# Patient Record
Sex: Female | Born: 1950 | Race: White | Hispanic: No | Marital: Married | Smoking: Never smoker
Health system: Southern US, Community
[De-identification: ages and names within clinical notes are randomized; demographics above are authoritative.]

## PROBLEM LIST (undated history)

## (undated) DIAGNOSIS — E079 Disorder of thyroid, unspecified: Secondary | ICD-10-CM

## (undated) DIAGNOSIS — J45909 Unspecified asthma, uncomplicated: Secondary | ICD-10-CM

## (undated) DIAGNOSIS — R1310 Dysphagia, unspecified: Secondary | ICD-10-CM

## (undated) DIAGNOSIS — K449 Diaphragmatic hernia without obstruction or gangrene: Secondary | ICD-10-CM

## (undated) DIAGNOSIS — M1990 Unspecified osteoarthritis, unspecified site: Secondary | ICD-10-CM

## (undated) DIAGNOSIS — E669 Obesity, unspecified: Secondary | ICD-10-CM

## (undated) DIAGNOSIS — C801 Malignant (primary) neoplasm, unspecified: Secondary | ICD-10-CM

## (undated) DIAGNOSIS — E039 Hypothyroidism, unspecified: Secondary | ICD-10-CM

## (undated) DIAGNOSIS — R12 Heartburn: Secondary | ICD-10-CM

## (undated) DIAGNOSIS — D649 Anemia, unspecified: Secondary | ICD-10-CM

## (undated) HISTORY — DX: Heartburn: R12

## (undated) HISTORY — DX: Disorder of thyroid, unspecified: E07.9

## (undated) HISTORY — DX: Diaphragmatic hernia without obstruction or gangrene: K44.9

## (undated) HISTORY — PX: MEDIAL PARTIAL KNEE REPLACEMENT: SHX5965

## (undated) HISTORY — DX: Obesity, unspecified: E66.9

## (undated) HISTORY — DX: Dysphagia, unspecified: R13.10

## (undated) HISTORY — DX: Unspecified osteoarthritis, unspecified site: M19.90

## (undated) HISTORY — PX: ABDOMINAL HYSTERECTOMY: SHX81

## (undated) HISTORY — DX: Anemia, unspecified: D64.9

---
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
## 1999-10-30 HISTORY — PX: LUMBAR LAMINECTOMY: SHX95

## 2012-10-29 HISTORY — PX: TOTAL HIP ARTHROPLASTY: SHX124

## 2019-10-19 ENCOUNTER — Encounter (HOSPITAL_COMMUNITY): Payer: Self-pay | Admitting: Hematology

## 2019-10-19 ENCOUNTER — Other Ambulatory Visit: Payer: Self-pay

## 2019-10-19 ENCOUNTER — Inpatient Hospital Stay (HOSPITAL_COMMUNITY): Payer: Medicare Other | Attending: Hematology | Admitting: Hematology

## 2019-10-19 VITALS — BP 133/54 | HR 77 | Temp 97.7°F | Resp 18 | Ht 64.5 in | Wt 236.0 lb

## 2019-10-19 DIAGNOSIS — Z79899 Other long term (current) drug therapy: Secondary | ICD-10-CM

## 2019-10-19 DIAGNOSIS — Z8249 Family history of ischemic heart disease and other diseases of the circulatory system: Secondary | ICD-10-CM

## 2019-10-19 DIAGNOSIS — Z8049 Family history of malignant neoplasm of other genital organs: Secondary | ICD-10-CM

## 2019-10-19 DIAGNOSIS — Z90710 Acquired absence of both cervix and uterus: Secondary | ICD-10-CM

## 2019-10-19 DIAGNOSIS — E039 Hypothyroidism, unspecified: Secondary | ICD-10-CM | POA: Diagnosis not present

## 2019-10-19 DIAGNOSIS — D509 Iron deficiency anemia, unspecified: Secondary | ICD-10-CM

## 2019-10-19 NOTE — Assessment & Plan Note (Addendum)
1.  Iron deficiency anemia: -Originally noted in 2010 worked up by gastroenterology with endoscopic and colonoscopy.  Anemia thought to be secondary to duodenal ulcers and poor absorption. -Anemia continued, repeat endoscopy and colonoscopy in 2015 did not reveal any evidence of bleeding.  Anemia thought to be secondary to poor absorption. -Repeat endoscopy and colonoscopy in 2019 did not reveal any evidence of bleeding. -Bone marrow biopsy completed in September 2020 showed normal marrow.  No evidence of hematology neoplasm or morphology.  Normal cytogenetics.  No increase in blasts on flow cytometry. -Patient is unable to tolerate p.o. iron supplements due to GI upset.  However she is continuing to take iron tablets on and off.  Unable to tolerate IV Feraheme or Venofer due to flulike symptoms.  She does not respond to IV Ferrlecit. -Most recent labs on October 12, 2019 reveal hemoglobin of 7.5, MCV 84.0, iron 13, transferrin 317, saturation 3%, TIBC 444, ferritin level 7.4. -Patient has been extensively worked up for her anemia.  It appears anemia is most likely secondary to poor GI absorption.  Today have recommended to complete additional lab studies to include vitamin H68, folic acid, methylmalonic acid, copper, SPEP, LDH, and reticulocyte count. -Have also recommended patient proceed with IV Injectafer x2 doses.  We discussed the side effects of Injectafer including but not limited to severe anaphylactic reaction.  Patient is aware of the side effects and agrees to proceed. -We will have her follow-up in 6 weeks post IV Injectafer.

## 2019-10-19 NOTE — Progress Notes (Signed)
[facility] Initial Visit:  Patient Care Team: Thea Alken as PCP - General (Nurse Practitioner)  CHIEF COMPLAINTS/PURPOSE OF CONSULTATION: -Iron deficiency anemia   HISTORY OF PRESENTING ILLNESS: Annette Frank 68 y.o. female presents today for consult regarding iron deficiency anemia.  She has a past medical history significant for vitamin D deficiency, polyarthritis, hypothyroidism.  She has a longstanding history of iron deficiency anemia.  Per patient her anemia started in 2001 after her hysterectomy.  Patient was worked up for anemia back in 2010 by gastroenterology and underwent endoscopy and colonoscopy.  Her anemia was thought to be contributed to NSAID associated duodenal ulcers and secondary poor absorption.  Despite this diagnosis, her anemia continued.  She went on to have additional EGDs and colonoscopies that were negative for any active bleeding.  At that point, anemia was thought  to be secondary to poor absorption.    Recent labs from 10/12/2019 revealed a hemoglobin of 7.5, MCV 84.0, iron level of 13, ferritin level of 7.4, transferrin 317, TIBC 444, and saturation of 3%.  She has tried multiple p.o. iron supplementation in the past and has been unable to tolerate them due to GI upset.  She is also tried IV Feraheme and Venofer and experienced flulike symptoms.  She is also recently tried IV Ferrlecit which did yield any improvement in her hemoglobin.  Patient responds best to IV Injectafer.  However, at the previous office she was being followed at [facility] is not offered.  She most recently had a bone marrow biopsy back in September 2020.  Bone marrow biopsy did not reveal any hematology malignancies or morphology.  Normal cytogenetics.  No increase in blasts on flow cytometry.  Today, she reports significant fatigue.  She denies any obvious signs of bleeding.  She does report significant shortness of breath with exertion, that does resolve with rest.   She denies any chest pain or lightheadedness or dizziness.  No change in bowel habits.  Appetite is stable.  No weight loss.  Denies any fevers, chills, night sweats.  She has a family history significant for endometrial cancer in her mother and bladder cancer in her maternal aunt.  Most recent GI work-up was in October 2019 with endoscopic and colonoscopy both were negative.   Review of Systems  Constitutional: Positive for fatigue.  HENT:  Negative.   Eyes: Negative.   Respiratory: Positive for shortness of breath.   Cardiovascular: Negative.   Gastrointestinal: Negative.   Endocrine: Negative.   Genitourinary: Negative.    Musculoskeletal: Positive for arthralgias.  Skin: Negative.   Neurological: Negative.   Hematological: Negative.   Psychiatric/Behavioral: Negative.     MEDICAL HISTORY: Past Medical History:  Diagnosis Date  . Anemia   . Arthritis   . Thyroid disease     SURGICAL HISTORY: Past Surgical History:  Procedure Laterality Date  . ABDOMINAL HYSTERECTOMY    . LUMBAR LAMINECTOMY N/A 2001  . MEDIAL PARTIAL KNEE REPLACEMENT Bilateral 2009 & 2015  . TOTAL HIP ARTHROPLASTY Right 2014  . UMBILICAL HERNIA REPAIR N/A 1955    SOCIAL HISTORY: Social History   Socioeconomic History  . Marital status: Married    Spouse name: Not on file  . Number of children: 2  . Years of education: Not on file  . Highest education level: Not on file  Occupational History  . Occupation: RETIRED  Tobacco Use  . Smoking status: Never Smoker  . Smokeless tobacco: Never Used  Substance and Sexual Activity  .  Alcohol use: Not Currently  . Drug use: Not Currently  . Sexual activity: Not Currently  Other Topics Concern  . Not on file  Social History Narrative  . Not on file   Social Determinants of Health   Financial Resource Strain: Low Risk   . Difficulty of Paying Living Expenses: Not hard at all  Food Insecurity: No Food Insecurity  . Worried About Charity fundraiser  in the Last Year: Never true  . Ran Out of Food in the Last Year: Never true  Transportation Needs: No Transportation Needs  . Lack of Transportation (Medical): No  . Lack of Transportation (Non-Medical): No  Physical Activity: Inactive  . Days of Exercise per Week: 0 days  . Minutes of Exercise per Session: 0 min  Stress: No Stress Concern Present  . Feeling of Stress : Not at all  Social Connections: Slightly Isolated  . Frequency of Communication with Friends and Family: Three times a week  . Frequency of Social Gatherings with Friends and Family: Never  . Attends Religious Services: More than 4 times per year  . Active Member of Clubs or Organizations: No  . Attends Archivist Meetings: Never  . Marital Status: Married  Human resources officer Violence: Not At Risk  . Fear of Current or Ex-Partner: No  . Emotionally Abused: No  . Physically Abused: No  . Sexually Abused: No    FAMILY HISTORY Family History  Problem Relation Age of Onset  . Hypertension Mother   . Cancer Mother        Endometrial cancer  . Hypothyroidism Father   . Arthritis Father   . Hypertension Brother   . Heart attack Maternal Grandfather   . Hypertension Son   . Healthy Son     ALLERGIES:  is allergic to cefuroxime axetil; skelaxin [metaxalone]; and codeine.  MEDICATIONS:  Current Outpatient Medications  Medication Sig Dispense Refill  . atenolol (TENORMIN) 25 MG tablet Take by mouth daily.    Marland Kitchen FERROCITE 324 MG TABS tablet Take 1 tablet by mouth daily.    Marland Kitchen levothyroxine (SYNTHROID) 100 MCG tablet Take 100 mcg by mouth daily before breakfast.    . Vitamin D, Ergocalciferol, (DRISDOL) 1.25 MG (50000 UT) CAPS capsule Take 50,000 Units by mouth once a week.    Marland Kitchen azelastine (ASTELIN) 0.1 % nasal spray Place 1 spray into both nostrils 2 (two) times daily.    . cephALEXin (KEFLEX) 500 MG capsule Take 500 mg by mouth 4 (four) times daily.    . fluticasone (FLONASE) 50 MCG/ACT nasal spray Place 1  spray into both nostrils 2 (two) times daily.     No current facility-administered medications for this visit.    PHYSICAL EXAMINATION:  ECOG PERFORMANCE STATUS: 1 - Symptomatic but completely ambulatory   Vitals:   10/19/19 1419  BP: (!) 133/54  Pulse: 77  Resp: 18  Temp: 97.7 F (36.5 C)  SpO2: 98%    Filed Weights   10/19/19 1419  Weight: 236 lb (107 kg)     Physical Exam Constitutional:      Appearance: Normal appearance. She is obese.  HENT:     Head: Normocephalic.     Right Ear: External ear normal.     Left Ear: External ear normal.     Nose: Nose normal.     Mouth/Throat:     Pharynx: Oropharynx is clear.  Eyes:     Conjunctiva/sclera: Conjunctivae normal.  Cardiovascular:     Rate  and Rhythm: Normal rate and regular rhythm.     Pulses: Normal pulses.     Heart sounds: Normal heart sounds.  Pulmonary:     Effort: Pulmonary effort is normal.     Breath sounds: Normal breath sounds.  Abdominal:     General: Bowel sounds are normal.  Musculoskeletal:     Cervical back: Normal range of motion.     Comments: Decreased range of motion  Skin:    General: Skin is warm.  Neurological:     General: No focal deficit present.     Mental Status: She is alert and oriented to person, place, and time.  Psychiatric:        Mood and Affect: Mood normal.        Behavior: Behavior normal.       ASSESSMENT/PLAN   Iron deficiency anemia 1.  Iron deficiency anemia: -Originally noted in 2010 worked up by gastroenterology with endoscopic and colonoscopy.  Anemia thought to be secondary to duodenal ulcers and poor absorption. -Anemia continued, repeat endoscopy and colonoscopy in 2015 did not reveal any evidence of bleeding.  Anemia thought to be secondary to poor absorption. -Repeat endoscopy and colonoscopy in 2019 did not reveal any evidence of bleeding. -Bone marrow biopsy completed in September 2020 showed normal marrow.  No evidence of hematology neoplasm  or morphology.  Normal cytogenetics.  No increase in blasts on flow cytometry. -Patient is unable to tolerate p.o. iron supplements due to GI upset.  However she is continuing to take iron tablets on and off.  Unable to tolerate IV Feraheme or Venofer due to flulike symptoms.  She does not respond to IV Ferrlecit. -Most recent labs on October 12, 2019 reveal hemoglobin of 7.5, MCV 84.0, iron 13, transferrin 317, saturation 3%, TIBC 444, ferritin level 7.4. -Patient has been extensively worked up for her anemia.  It appears anemia is most likely secondary to poor GI absorption.  Today have recommended to complete additional lab studies to include vitamin D17, folic acid, methylmalonic acid, copper, SPEP, LDH, and reticulocyte count. -Have also recommended patient proceed with IV Injectafer x2 doses.  We discussed the side effects of Injectafer including but not limited to severe anaphylactic reaction.  Patient is aware of the side effects and agrees to proceed. -We will have her follow-up in 6 weeks post IV Injectafer.   Orders Placed This Encounter  Procedures  . CBC with Differential    Standing Status:   Future    Standing Expiration Date:   10/18/2020  . Comprehensive metabolic panel    Standing Status:   Future    Standing Expiration Date:   10/18/2020  . Vitamin B12    Standing Status:   Future    Standing Expiration Date:   10/18/2020  . Folate    Standing Status:   Future    Standing Expiration Date:   10/18/2020  . Methylmalonic acid, serum    Standing Status:   Future    Standing Expiration Date:   10/18/2020  . Reticulocytes    Standing Status:   Future    Standing Expiration Date:   10/18/2020  . Lactate dehydrogenase    Standing Status:   Future    Standing Expiration Date:   10/18/2020  . Multiple Myeloma Panel (SPEP&IFE w/QIG)    Standing Status:   Future    Standing Expiration Date:   10/18/2020   Addendum: -I have independently examined and elicited history from  this patient.  I  agree with the HPI and assessment and plan written by my nurse practitioner Carver Fila, FNP.  She has been taking iron tablets intermittently for more than 8 years and has constipation and stomach upset from it.  She had received Injectafer x2 in May 2020 and tolerated very well.  However because of insurance requirements, she was switched to Venofer weekly, finished 6 infusions in August and September 2020.  Subsequently she was treated with Ferrlecit 6 weekly infusions which she finished on 08/31/2019.  She never had a history of transfusions.  She has some ice pica.  Also reports pins-and-needles in the feet while standing.  As she had tolerated Injectafer very well, we will proceed with Injectafer weekly x2.  We will also check for coexisting nutritional deficiencies as her MCV is 85 for the degree of anemia and degree of iron deficiency.  All questions were answered. The patient knows to call the clinic with any problems, questions or concerns.  This note was electronically signed.    Derek Jack, MD  10/19/2019 5:35 PM

## 2019-10-19 NOTE — Patient Instructions (Signed)
[place] at [facility] Discharge Instructions  You were seen today by Dr. Delton Coombes. He went over your history, family history and how you've been feeling lately. He will have blood work drawn today. He will see you back in for labs and follow up.   Thank you for choosing [place] at [facility] to provide your oncology and hematology care.  To afford each patient quality time with our provider, please arrive at least 15 minutes before your scheduled appointment time.   If you have a lab appointment with the [facility] please come in thru the  Main Entrance and check in at the main information desk  You need to re-schedule your appointment should you arrive 10 or more minutes late.  We strive to give you quality time with our providers, and arriving late affects you and other patients whose appointments are after yours.  Also, if you no show three or more times for appointments you may be dismissed from the clinic at the providers discretion.     Again, thank you for choosing [facility].  Our hope is that these requests will decrease the amount of time that you wait before being seen by our physicians.       _____________________________________________________________  Should you have questions after your visit to [facility], please contact our office at [phone] between the hours of 8:00 a.m. and 4:30 p.m.  Voicemails left after 4:00 p.m. will not be returned until the following business day.  For prescription refill requests, have your pharmacy contact our office and allow 72 hours.    Cancer Center Support Programs:   > Cancer Support Group  2nd Tuesday of the month 1pm-2pm, Journey Room

## 2019-10-21 ENCOUNTER — Inpatient Hospital Stay (HOSPITAL_COMMUNITY): Payer: Medicare Other

## 2019-10-21 ENCOUNTER — Other Ambulatory Visit: Payer: Self-pay

## 2019-10-21 VITALS — BP 126/50 | HR 65 | Temp 96.7°F | Resp 18

## 2019-10-21 DIAGNOSIS — D509 Iron deficiency anemia, unspecified: Secondary | ICD-10-CM

## 2019-10-21 MED ORDER — SODIUM CHLORIDE 0.9 % IV SOLN: Freq: Once | INTRAVENOUS | Status: AC

## 2019-10-21 MED ORDER — SODIUM CHLORIDE 0.9 % IV SOLN
750.0000 mg | Freq: Once | INTRAVENOUS | Status: AC
  Administered 2019-10-21: 750 mg via INTRAVENOUS
  Filled 2019-10-21: qty 15

## 2019-10-21 MED ORDER — FAMOTIDINE IN NACL 20-0.9 MG/50ML-% IV SOLN
20.0000 mg | Freq: Once | INTRAVENOUS | Status: AC
  Administered 2019-10-21: 13:00:00 20 mg via INTRAVENOUS
  Filled 2019-10-21: qty 50

## 2019-10-21 MED ORDER — ACETAMINOPHEN 325 MG PO TABS
650.0000 mg | ORAL_TABLET | Freq: Once | ORAL | Status: AC
  Administered 2019-10-21: 13:00:00 650 mg via ORAL
  Filled 2019-10-21: qty 2

## 2019-10-21 MED ORDER — DIPHENHYDRAMINE HCL 25 MG PO CAPS
50.0000 mg | ORAL_CAPSULE | Freq: Once | ORAL | Status: AC
  Administered 2019-10-21: 50 mg via ORAL
  Filled 2019-10-21: qty 2

## 2019-10-21 MED ORDER — METHYLPREDNISOLONE SODIUM SUCC 125 MG IJ SOLR
125.0000 mg | Freq: Once | INTRAMUSCULAR | Status: AC
  Administered 2019-10-21: 125 mg via INTRAVENOUS
  Filled 2019-10-21: qty 2

## 2019-10-21 NOTE — Progress Notes (Signed)
Pt presents today for IV Injectafer infusion. Per Harriet Pho, NP, since patient has a history of previous reactions to iron infusions, premedicate patient with PO Benadryl, Tylenol, IV Solumedrol, and IV Pepcid. Orders already entered into treatment plan. Hold Claritin since we are giving Benadryl. I confirmed with patient that she has someone that can drive her home and she states that her husband came with her and he would be able to drive her home.   Mitchel Honour tolerated IV Injectafer without incident or complaint. VSS. Discharged in satisfactory condition with follow up instructions.

## 2019-10-21 NOTE — Patient Instructions (Signed)
[place] at [facility]  Discharge Instructions:  IV Injectafer received today. _______________________________________________________________  Thank you for choosing [place] at [facility] to provide your oncology and hematology care.  To afford each patient quality time with our providers, please arrive at least 15 minutes before your scheduled appointment.  You need to re-schedule your appointment if you arrive 10 or more minutes late.  We strive to give you quality time with our providers, and arriving late affects you and other patients whose appointments are after yours.  Also, if you no show three or more times for appointments you may be dismissed from the clinic.  Again, thank you for choosing [place] at [place] hope is that these requests will allow you access to exceptional care and in a timely manner. _______________________________________________________________  If you have questions after your visit, please contact our office at [phone] between the hours of 8:30 a.m. and 5:00 p.m. Voicemails left after 4:30 p.m. will not be returned until the following business day. _______________________________________________________________  For prescription refill requests, have your pharmacy contact our office. _______________________________________________________________  Recommendations made by the consultant and any test results will be sent to your referring physician. _______________________________________________________________

## 2019-10-28 ENCOUNTER — Other Ambulatory Visit: Payer: Self-pay

## 2019-10-28 ENCOUNTER — Inpatient Hospital Stay (HOSPITAL_COMMUNITY): Payer: Medicare Other

## 2019-10-28 ENCOUNTER — Ambulatory Visit (HOSPITAL_COMMUNITY): Payer: Medicare Other

## 2019-10-28 ENCOUNTER — Encounter (HOSPITAL_COMMUNITY): Payer: Self-pay

## 2019-10-28 VITALS — BP 118/56 | HR 65 | Temp 96.8°F | Resp 18

## 2019-10-28 DIAGNOSIS — D509 Iron deficiency anemia, unspecified: Secondary | ICD-10-CM | POA: Diagnosis not present

## 2019-10-28 MED ORDER — SODIUM CHLORIDE 0.9 % IV SOLN: Freq: Once | INTRAVENOUS | Status: AC

## 2019-10-28 MED ORDER — ACETAMINOPHEN 325 MG PO TABS
ORAL_TABLET | ORAL | Status: AC
  Filled 2019-10-28: qty 1

## 2019-10-28 MED ORDER — DIPHENHYDRAMINE HCL 25 MG PO CAPS
50.0000 mg | ORAL_CAPSULE | Freq: Once | ORAL | Status: AC
  Administered 2019-10-28: 50 mg via ORAL
  Filled 2019-10-28: qty 2

## 2019-10-28 MED ORDER — FAMOTIDINE IN NACL 20-0.9 MG/50ML-% IV SOLN
20.0000 mg | Freq: Once | INTRAVENOUS | Status: AC
  Administered 2019-10-28: 09:00:00 20 mg via INTRAVENOUS
  Filled 2019-10-28: qty 50

## 2019-10-28 MED ORDER — METHYLPREDNISOLONE SODIUM SUCC 125 MG IJ SOLR
125.0000 mg | Freq: Once | INTRAMUSCULAR | Status: AC
  Administered 2019-10-28: 09:00:00 125 mg via INTRAVENOUS
  Filled 2019-10-28: qty 2

## 2019-10-28 MED ORDER — ACETAMINOPHEN 325 MG PO TABS
650.0000 mg | ORAL_TABLET | Freq: Once | ORAL | Status: AC
  Administered 2019-10-28: 650 mg via ORAL
  Filled 2019-10-28: qty 2

## 2019-10-28 MED ORDER — SODIUM CHLORIDE 0.9 % IV SOLN
750.0000 mg | Freq: Once | INTRAVENOUS | Status: AC
  Administered 2019-10-28: 750 mg via INTRAVENOUS
  Filled 2019-10-28: qty 750

## 2019-10-28 NOTE — Progress Notes (Signed)
Patient presents today for Injectafer infusion. Patient has no complaints of any changes since her last visit. Vital signs within parameters.   Injectafer given today per MD orders. Tolerated infusion without adverse affects. Vital signs stable. No complaints at this time. Discharged from clinic ambulatory. F/U with [facility] as scheduled.

## 2019-10-28 NOTE — Patient Instructions (Signed)
[facility] at [facility]  Discharge Instructions:   _______________________________________________________________  Thank you for choosing [facility] at [facility] to provide your oncology and hematology care.  To afford each patient quality time with our providers, please arrive at least 15 minutes before your scheduled appointment.  You need to re-schedule your appointment if you arrive 10 or more minutes late.  We strive to give you quality time with our providers, and arriving late affects you and other patients whose appointments are after yours.  Also, if you no show three or more times for appointments you may be dismissed from the clinic.  Again, thank you for choosing [facility] at [facility]. Our hope is that these requests will allow you access to exceptional care and in a timely manner. _______________________________________________________________  If you have questions after your visit, please contact our office at [phone] between the hours of 8:30 a.m. and 5:00 p.m. Voicemails left after 4:30 p.m. will not be returned until the following business day. _______________________________________________________________  For prescription refill requests, have your pharmacy contact our office. _______________________________________________________________  Recommendations made by the consultant and any test results will be sent to your referring physician. _______________________________________________________________ 

## 2019-12-02 ENCOUNTER — Other Ambulatory Visit (HOSPITAL_COMMUNITY): Payer: Self-pay

## 2019-12-02 DIAGNOSIS — D509 Iron deficiency anemia, unspecified: Secondary | ICD-10-CM

## 2019-12-03 ENCOUNTER — Other Ambulatory Visit: Payer: Self-pay

## 2019-12-03 ENCOUNTER — Inpatient Hospital Stay (HOSPITAL_COMMUNITY): Payer: Medicare Other | Attending: Hematology

## 2019-12-03 DIAGNOSIS — M1990 Unspecified osteoarthritis, unspecified site: Secondary | ICD-10-CM | POA: Diagnosis not present

## 2019-12-03 DIAGNOSIS — Z8249 Family history of ischemic heart disease and other diseases of the circulatory system: Secondary | ICD-10-CM | POA: Diagnosis not present

## 2019-12-03 DIAGNOSIS — E079 Disorder of thyroid, unspecified: Secondary | ICD-10-CM | POA: Insufficient documentation

## 2019-12-03 DIAGNOSIS — D509 Iron deficiency anemia, unspecified: Secondary | ICD-10-CM

## 2019-12-03 DIAGNOSIS — Z79899 Other long term (current) drug therapy: Secondary | ICD-10-CM | POA: Diagnosis not present

## 2019-12-03 DIAGNOSIS — Z90710 Acquired absence of both cervix and uterus: Secondary | ICD-10-CM | POA: Diagnosis not present

## 2019-12-03 DIAGNOSIS — Z8049 Family history of malignant neoplasm of other genital organs: Secondary | ICD-10-CM | POA: Diagnosis not present

## 2019-12-03 LAB — CBC WITH DIFFERENTIAL/PLATELET
Abs Immature Granulocytes: 0.03 10*3/uL (ref 0.00–0.07)
Basophils Absolute: 0 10*3/uL (ref 0.0–0.1)
Basophils Relative: 1 %
Eosinophils Absolute: 0.2 10*3/uL (ref 0.0–0.5)
Eosinophils Relative: 3 %
HCT: 33.7 % — ABNORMAL LOW (ref 36.0–46.0)
Hemoglobin: 10.1 g/dL — ABNORMAL LOW (ref 12.0–15.0)
Immature Granulocytes: 1 %
Lymphocytes Relative: 16 %
Lymphs Abs: 1.1 10*3/uL (ref 0.7–4.0)
MCH: 27.4 pg (ref 26.0–34.0)
MCHC: 30 g/dL (ref 30.0–36.0)
MCV: 91.6 fL (ref 80.0–100.0)
Monocytes Absolute: 0.6 10*3/uL (ref 0.1–1.0)
Monocytes Relative: 10 %
Neutro Abs: 4.6 10*3/uL (ref 1.7–7.7)
Neutrophils Relative %: 69 %
Platelets: 254 10*3/uL (ref 150–400)
RBC: 3.68 MIL/uL — ABNORMAL LOW (ref 3.87–5.11)
RDW: 18.4 % — ABNORMAL HIGH (ref 11.5–15.5)
WBC: 6.5 10*3/uL (ref 4.0–10.5)
nRBC: 0 % (ref 0.0–0.2)

## 2019-12-03 LAB — RETICULOCYTES
Immature Retic Fract: 22.4 % — ABNORMAL HIGH (ref 2.3–15.9)
RBC.: 3.7 MIL/uL — ABNORMAL LOW (ref 3.87–5.11)
Retic Count, Absolute: 104.7 10*3/uL (ref 19.0–186.0)
Retic Ct Pct: 2.8 % (ref 0.4–3.1)

## 2019-12-03 LAB — LACTATE DEHYDROGENASE: LDH: 127 U/L (ref 98–192)

## 2019-12-03 LAB — COMPREHENSIVE METABOLIC PANEL
ALT: 19 U/L (ref 0–44)
AST: 16 U/L (ref 15–41)
Albumin: 4 g/dL (ref 3.5–5.0)
Alkaline Phosphatase: 100 U/L (ref 38–126)
Anion gap: 8 (ref 5–15)
BUN: 15 mg/dL (ref 8–23)
CO2: 24 mmol/L (ref 22–32)
Calcium: 9.3 mg/dL (ref 8.9–10.3)
Chloride: 108 mmol/L (ref 98–111)
Creatinine, Ser: 0.83 mg/dL (ref 0.44–1.00)
GFR calc Af Amer: >60 mL/min (ref >60)
GFR calc non Af Amer: >60 mL/min (ref >60)
Glucose, Bld: 103 mg/dL — ABNORMAL HIGH (ref 70–99)
Potassium: 4.6 mmol/L (ref 3.5–5.1)
Sodium: 140 mmol/L (ref 135–145)
Total Bilirubin: 0.5 mg/dL (ref 0.3–1.2)
Total Protein: 6.1 g/dL — ABNORMAL LOW (ref 6.5–8.1)

## 2019-12-03 LAB — FOLATE: Folate: 11.1 ng/mL (ref >5.9)

## 2019-12-03 LAB — VITAMIN B12: Vitamin B-12: 221 pg/mL (ref 180–914)

## 2019-12-07 LAB — MULTIPLE MYELOMA PANEL, SERUM
Albumin SerPl Elph-Mcnc: 3.7 g/dL (ref 2.9–4.4)
Albumin/Glob SerPl: 1.6 (ref 0.7–1.7)
Alpha 1: 0.2 g/dL (ref 0.0–0.4)
Alpha2 Glob SerPl Elph-Mcnc: 0.6 g/dL (ref 0.4–1.0)
B-Globulin SerPl Elph-Mcnc: 1.1 g/dL (ref 0.7–1.3)
Gamma Glob SerPl Elph-Mcnc: 0.5 g/dL (ref 0.4–1.8)
Globulin, Total: 2.4 g/dL (ref 2.2–3.9)
IgA: 150 mg/dL (ref 87–352)
IgG (Immunoglobin G), Serum: 489 mg/dL — ABNORMAL LOW (ref 586–1602)
IgM (Immunoglobulin M), Srm: 65 mg/dL (ref 26–217)
Total Protein ELP: 6.1 g/dL (ref 6.0–8.5)

## 2019-12-08 ENCOUNTER — Inpatient Hospital Stay (HOSPITAL_BASED_OUTPATIENT_CLINIC_OR_DEPARTMENT_OTHER): Payer: Medicare Other | Admitting: Nurse Practitioner

## 2019-12-08 ENCOUNTER — Other Ambulatory Visit: Payer: Self-pay

## 2019-12-08 DIAGNOSIS — D509 Iron deficiency anemia, unspecified: Secondary | ICD-10-CM | POA: Diagnosis not present

## 2019-12-08 NOTE — Assessment & Plan Note (Addendum)
1.  Iron deficiency anemia: -Originally noted in 2010 worked up by gastroenterology with endoscopy and colonoscopy. -Anemia thought to be secondary to duodenal ulcers and poor absorption. -Anemia continued, repeated endoscopy and colonoscopy in 2015 did not reveal any evidence of bleeding.  Anemia thought to be secondary to poor absorption. -Repeat endoscopy and colonoscopy in 2019 did not reveal any evidence of bleeding. -Bone marrow biopsy completed in September 2020 showed normal marrow.  No evidence of hematologic neoplasm or morphology.  Normal cytogenetics.  No increase in blasts on flow cytometry. -Patient is unable to tolerate p.o. iron supplements due to GI upset.  However she is continuing to take iron tablets on and off. -Patient is unable to tolerate IV Feraheme or Venofer due to flulike symptoms.  She does not respond to IV Ferrlecit. -Patient has been extensively worked up for her anemia.  It appears anemia is most likely secondary to poor GI absorption. -Patient received 2 doses of Injectafer on 10/21/2019 and 10/28/2019.  She received premeds and tolerated well. -Labs done on 12/03/2019 showed SPEP negative.  Creatinine 0.83, vitamin B12 221, WBC 6.5, hemoglobin 10.1, platelets 254. -Patient reports she felt better after her Injectafer infusions but is starting to feel fatigued again. -We will give her 2 more infusions of Injectafer with premeds. -We will follow-up in 2 months with repeat labs.

## 2019-12-08 NOTE — Patient Instructions (Signed)
[facility] at [facility] Discharge Instructions  Follow up in 2 months with labs    Thank you for choosing [facility] at [facility] to provide your oncology and hematology care.  To afford each patient quality time with our provider, please arrive at least 15 minutes before your scheduled appointment time.   If you have a lab appointment with the Cancer Center please come in thru the Main Entrance and check in at the main information desk.  You need to re-schedule your appointment should you arrive 10 or more minutes late.  We strive to give you quality time with our providers, and arriving late affects you and other patients whose appointments are after yours.  Also, if you no show three or more times for appointments you may be dismissed from the clinic at the providers discretion.     Again, thank you for choosing [facility].  Our hope is that these requests will decrease the amount of time that you wait before being seen by our physicians.       _____________________________________________________________  Should you have questions after your visit to [facility], please contact our office at [phone] between the hours of 8:00 a.m. and 4:30 p.m.  Voicemails left after 4:00 p.m. will not be returned until the following business day.  For prescription refill requests, have your pharmacy contact our office and allow 72 hours.    Due to Covid, you will need to wear a mask upon entering the hospital. If you do not have a mask, a mask will be given to you at the Main Entrance upon arrival. For doctor visits, patients may have 1 support person with them. For treatment visits, patients can not have anyone with them due to social distancing guidelines and our immunocompromised population.      

## 2019-12-08 NOTE — Progress Notes (Signed)
[name], [place] [address]   CLINIC:  Medical Oncology/Hematology  PCP:  Thea Alken [address] [place] [address] [phone]   REASON FOR VISIT: Follow-up for iron deficiency anemia  CURRENT THERAPY: Intermittent iron infusions   INTERVAL HISTORY:  [name] 69 y.o. female was called for a telephone visit today for iron deficiency anemia.  Patient reports she felt great after her iron infusions.  She is starting to feel more fatigued now.  She denies any shortness of breath.  She denies any bright red bleeding per rectum or melena. Denies any nausea, vomiting, or diarrhea. Denies any new pains. Had not noticed any recent bleeding such as epistaxis, hematuria or hematochezia. Denies recent chest pain on exertion, shortness of breath on minimal exertion, pre-syncopal episodes, or palpitations. Denies any numbness or tingling in hands or feet. Denies any recent fevers, infections, or recent hospitalizations. Patient reports appetite at 100% and energy level at 75%.  She is eating well maintaining her weight at this time.    REVIEW OF SYSTEMS:  Review of Systems  Constitutional: Positive for fatigue.  All other systems reviewed and are negative.    PAST MEDICAL/SURGICAL HISTORY:  Past Medical History:  Diagnosis Date  . Anemia   . Arthritis   . Thyroid disease    Past Surgical History:  Procedure Laterality Date  . ABDOMINAL HYSTERECTOMY    . LUMBAR LAMINECTOMY N/A 2001  . MEDIAL PARTIAL KNEE REPLACEMENT Bilateral 2009 & 2015  . TOTAL HIP ARTHROPLASTY Right 2014  . UMBILICAL HERNIA REPAIR N/A 1955     SOCIAL HISTORY:  Social History   Socioeconomic History  . Marital status: Married    Spouse name: Not on file  . Number of children: 2  . Years of education: Not on file  . Highest education level: Not on file  Occupational History  . Occupation: RETIRED  Tobacco Use  . Smoking status: Never Smoker  .  Smokeless tobacco: Never Used  Substance and Sexual Activity  . Alcohol use: Not Currently  . Drug use: Not Currently  . Sexual activity: Not Currently  Other Topics Concern  . Not on file  Social History Narrative  . Not on file   Social Determinants of Health   Financial Resource Strain: Low Risk   . Difficulty of Paying Living Expenses: Not hard at all  Food Insecurity: No Food Insecurity  . Worried About Charity fundraiser in the Last Year: Never true  . Ran Out of Food in the Last Year: Never true  Transportation Needs: No Transportation Needs  . Lack of Transportation (Medical): No  . Lack of Transportation (Non-Medical): No  Physical Activity: Inactive  . Days of Exercise per Week: 0 days  . Minutes of Exercise per Session: 0 min  Stress: No Stress Concern Present  . Feeling of Stress : Not at all  Social Connections: Slightly Isolated  . Frequency of Communication with Friends and Family: Three times a week  . Frequency of Social Gatherings with Friends and Family: Never  . Attends Religious Services: More than 4 times per year  . Active Member of Clubs or Organizations: No  . Attends Archivist Meetings: Never  . Marital Status: Married  Human resources officer Violence: Not At Risk  . Fear of Current or Ex-Partner: No  . Emotionally Abused: No  . Physically Abused: No  . Sexually Abused: No    FAMILY HISTORY:  Family  History  Problem Relation Age of Onset  . Hypertension Mother   . Cancer Mother        Endometrial cancer  . Hypothyroidism Father   . Arthritis Father   . Hypertension Brother   . Heart attack Maternal Grandfather   . Hypertension Son   . Healthy Son     CURRENT MEDICATIONS:  Outpatient Encounter Medications as of 12/08/2019  Medication Sig  . atenolol (TENORMIN) 25 MG tablet Take by mouth daily.  Marland Kitchen azelastine (ASTELIN) 0.1 % nasal spray Place 1 spray into both nostrils 2 (two) times daily.  . cephALEXin (KEFLEX) 500 MG capsule Take  500 mg by mouth 4 (four) times daily.  Marland Kitchen FERROCITE 324 MG TABS tablet Take 1 tablet by mouth daily.  . fluticasone (FLONASE) 50 MCG/ACT nasal spray Place 1 spray into both nostrils 2 (two) times daily.  Marland Kitchen levothyroxine (SYNTHROID) 100 MCG tablet Take 100 mcg by mouth daily before breakfast.  . Vitamin D, Ergocalciferol, (DRISDOL) 1.25 MG (50000 UT) CAPS capsule Take 50,000 Units by mouth once a week.   No facility-administered encounter medications on file as of 12/08/2019.    ALLERGIES:  Allergies  Allergen Reactions  . Cefuroxime Axetil Nausea And Vomiting  . Skelaxin [Metaxalone] Nausea And Vomiting  . Codeine Palpitations     Vital signs: -Deferred due to telephone visit   Physical Exam -Deferred due to telephone visit -Patient was alert and oriented over the phone and in no acute distress.    LABORATORY DATA:  I have reviewed the labs as listed.  CBC    Component Value Date/Time   WBC 6.5 12/03/2019 1413   RBC 3.70 (L) 12/03/2019 1414   RBC 3.68 (L) 12/03/2019 1413   HGB 10.1 (L) 12/03/2019 1413   HCT 33.7 (L) 12/03/2019 1413   PLT 254 12/03/2019 1413   MCV 91.6 12/03/2019 1413   MCH 27.4 12/03/2019 1413   MCHC 30.0 12/03/2019 1413   RDW 18.4 (H) 12/03/2019 1413   LYMPHSABS 1.1 12/03/2019 1413   MONOABS 0.6 12/03/2019 1413   EOSABS 0.2 12/03/2019 1413   BASOSABS 0.0 12/03/2019 1413   CMP Latest Ref Rng & Units 12/03/2019  Glucose 70 - 99 mg/dL 103(H)  BUN 8 - 23 mg/dL 15  Creatinine 0.44 - 1.00 mg/dL 0.83  Sodium 135 - 145 mmol/L 140  Potassium 3.5 - 5.1 mmol/L 4.6  Chloride 98 - 111 mmol/L 108  CO2 22 - 32 mmol/L 24  Calcium 8.9 - 10.3 mg/dL 9.3  Total Protein 6.5 - 8.1 g/dL 6.1(L)  Total Bilirubin 0.3 - 1.2 mg/dL 0.5  Alkaline Phos 38 - 126 U/L 100  AST 15 - 41 U/L 16  ALT 0 - 44 U/L 19    All questions were answered to patient's stated satisfaction. Encouraged patient to call with any new concerns or questions before his next visit to the cancer  center and we can certain see him sooner, if needed.      ASSESSMENT & PLAN:   Iron deficiency anemia 1.  Iron deficiency anemia: -Originally noted in 2010 worked up by gastroenterology with endoscopy and colonoscopy. -Anemia thought to be secondary to duodenal ulcers and poor absorption. -Anemia continued, repeated endoscopy and colonoscopy in 2015 did not reveal any evidence of bleeding.  Anemia thought to be secondary to poor absorption. -Repeat endoscopy and colonoscopy in 2019 did not reveal any evidence of bleeding. -Bone marrow biopsy completed in September 2020 showed normal marrow.  No evidence of hematologic  neoplasm or morphology.  Normal cytogenetics.  No increase in blasts on flow cytometry. -Patient is unable to tolerate p.o. iron supplements due to GI upset.  However she is continuing to take iron tablets on and off. -Patient is unable to tolerate IV Feraheme or Venofer due to flulike symptoms.  She does not respond to IV Ferrlecit. -Patient has been extensively worked up for her anemia.  It appears anemia is most likely secondary to poor GI absorption. -Patient received 2 doses of Injectafer on 10/21/2019 and 10/28/2019.  She received premeds and tolerated well. -Labs done on 12/03/2019 showed SPEP negative.  Creatinine 0.83, vitamin B12 221, WBC 6.5, hemoglobin 10.1, platelets 254. -Patient reports she felt better after her Injectafer infusions but is starting to feel fatigued again. -We will give her 2 more infusions of Injectafer with premeds. -We will follow-up in 2 months with repeat labs.      Orders placed this encounter:  Orders Placed This Encounter  Procedures  . Lactate dehydrogenase  . CBC with Differential/Platelet  . Comprehensive metabolic panel  . Ferritin  . Iron and TIBC  . Vitamin B12  . VITAMIN D 25 Hydroxy (Vit-D Deficiency, Fractures)  . Folate  . Copper, serum   I provided 19 minutes of non face-to-face telephone visit time during this  encounter, and > 50% was spent counseling as documented under my assessment & plan.  Francene Finders, FNP-C [place] [phone]

## 2019-12-09 LAB — METHYLMALONIC ACID, SERUM: Methylmalonic Acid, Quantitative: 199 nmol/L (ref 0–378)

## 2019-12-11 ENCOUNTER — Encounter (HOSPITAL_COMMUNITY): Payer: Self-pay

## 2019-12-11 ENCOUNTER — Inpatient Hospital Stay (HOSPITAL_COMMUNITY): Payer: Medicare Other

## 2019-12-11 ENCOUNTER — Other Ambulatory Visit: Payer: Self-pay

## 2019-12-11 VITALS — BP 118/67 | HR 60 | Temp 97.5°F | Resp 18

## 2019-12-11 DIAGNOSIS — D509 Iron deficiency anemia, unspecified: Secondary | ICD-10-CM

## 2019-12-11 MED ORDER — SODIUM CHLORIDE 0.9 % IV SOLN
750.0000 mg | Freq: Once | INTRAVENOUS | Status: AC
  Administered 2019-12-11: 750 mg via INTRAVENOUS
  Filled 2019-12-11: qty 750

## 2019-12-11 MED ORDER — LORATADINE 10 MG PO TABS
10.0000 mg | ORAL_TABLET | Freq: Once | ORAL | Status: AC
  Administered 2019-12-11: 10 mg via ORAL
  Filled 2019-12-11: qty 1

## 2019-12-11 MED ORDER — DIPHENHYDRAMINE HCL 50 MG/ML IJ SOLN
50.0000 mg | Freq: Once | INTRAMUSCULAR | Status: AC
  Administered 2019-12-11: 50 mg via INTRAVENOUS
  Filled 2019-12-11: qty 1

## 2019-12-11 MED ORDER — SODIUM CHLORIDE 0.9 % IV SOLN: Freq: Once | INTRAVENOUS | Status: AC

## 2019-12-11 NOTE — Progress Notes (Signed)
Patient presents today for Injectafer. Vital sings stable. Patient has no complaints of any changes since the last visit. Patient requesting pre -meds. Patient has generalized pain today she rates a 5/10.   Injectafer given today per MD orders. Tolerated infusion without adverse affects. Vital signs stable. No complaints at this time. Discharged from clinic ambulatory. F/U with [facility] as scheduled.

## 2019-12-11 NOTE — Patient Instructions (Signed)
[facility] at [facility]  Discharge Instructions:   _______________________________________________________________  Thank you for choosing [facility] at [facility] to provide your oncology and hematology care.  To afford each patient quality time with our providers, please arrive at least 15 minutes before your scheduled appointment.  You need to re-schedule your appointment if you arrive 10 or more minutes late.  We strive to give you quality time with our providers, and arriving late affects you and other patients whose appointments are after yours.  Also, if you no show three or more times for appointments you may be dismissed from the clinic.  Again, thank you for choosing  [facility] Center at [facility]. Our hope is that these requests will allow you access to exceptional care and in a timely manner. _______________________________________________________________  If you have questions after your visit, please contact our office at [phone] between the hours of 8:30 a.m. and 5:00 p.m. Voicemails left after 4:30 p.m. will not be returned until the following business day. _______________________________________________________________  For prescription refill requests, have your pharmacy contact our office. _______________________________________________________________  Recommendations made by the consultant and any test results will be sent to your referring physician. _______________________________________________________________ 

## 2019-12-18 ENCOUNTER — Other Ambulatory Visit: Payer: Self-pay

## 2019-12-18 ENCOUNTER — Encounter (HOSPITAL_COMMUNITY): Payer: Self-pay

## 2019-12-18 ENCOUNTER — Inpatient Hospital Stay (HOSPITAL_COMMUNITY): Payer: Medicare Other

## 2019-12-18 VITALS — BP 129/62 | HR 62 | Temp 96.8°F | Resp 18

## 2019-12-18 DIAGNOSIS — D509 Iron deficiency anemia, unspecified: Secondary | ICD-10-CM

## 2019-12-18 MED ORDER — LORATADINE 10 MG PO TABS
10.0000 mg | ORAL_TABLET | Freq: Once | ORAL | Status: AC
  Administered 2019-12-18: 10 mg via ORAL
  Filled 2019-12-18: qty 1

## 2019-12-18 MED ORDER — DIPHENHYDRAMINE HCL 50 MG/ML IJ SOLN
50.0000 mg | Freq: Once | INTRAMUSCULAR | Status: AC
  Administered 2019-12-18: 50 mg via INTRAVENOUS
  Filled 2019-12-18: qty 1

## 2019-12-18 MED ORDER — SODIUM CHLORIDE 0.9 % IV SOLN: INTRAVENOUS | Status: DC

## 2019-12-18 MED ORDER — SODIUM CHLORIDE 0.9 % IV SOLN
750.0000 mg | Freq: Once | INTRAVENOUS | Status: AC
  Administered 2019-12-18: 750 mg via INTRAVENOUS
  Filled 2019-12-18: qty 15

## 2019-12-18 NOTE — Progress Notes (Signed)
Treatment given per orders. Patient tolerated it well without problems. Vitals stable and discharged home from clinic ambulatory. Follow up as scheduled.  

## 2019-12-18 NOTE — Patient Instructions (Signed)
[facility] at [facility]  Discharge Instructions:   _______________________________________________________________  Thank you for choosing [facility] at [facility] to provide your oncology and hematology care.  To afford each patient quality time with our providers, please arrive at least 15 minutes before your scheduled appointment.  You need to re-schedule your appointment if you arrive 10 or more minutes late.  We strive to give you quality time with our providers, and arriving late affects you and other patients whose appointments are after yours.  Also, if you no show three or more times for appointments you may be dismissed from the clinic.  Again, thank you for choosing [facility] at [facility]. Our hope is that these requests will allow you access to exceptional care and in a timely manner. _______________________________________________________________  If you have questions after your visit, please contact our office at [phone] between the hours of 8:30 a.m. and 5:00 p.m. Voicemails left after 4:30 p.m. will not be returned until the following business day. _______________________________________________________________  For prescription refill requests, have your pharmacy contact our office. _______________________________________________________________  Recommendations made by the consultant and any test results will be sent to your referring physician. _______________________________________________________________ 

## 2020-01-28 ENCOUNTER — Other Ambulatory Visit: Payer: Self-pay

## 2020-01-28 ENCOUNTER — Inpatient Hospital Stay (HOSPITAL_COMMUNITY): Payer: Medicare Other | Attending: Hematology

## 2020-01-28 DIAGNOSIS — E079 Disorder of thyroid, unspecified: Secondary | ICD-10-CM | POA: Insufficient documentation

## 2020-01-28 DIAGNOSIS — Z79899 Other long term (current) drug therapy: Secondary | ICD-10-CM | POA: Insufficient documentation

## 2020-01-28 DIAGNOSIS — D509 Iron deficiency anemia, unspecified: Secondary | ICD-10-CM

## 2020-01-28 LAB — CBC WITH DIFFERENTIAL/PLATELET
Abs Immature Granulocytes: 0.06 10*3/uL (ref 0.00–0.07)
Basophils Absolute: 0 10*3/uL (ref 0.0–0.1)
Basophils Relative: 0 %
Eosinophils Absolute: 0.1 10*3/uL (ref 0.0–0.5)
Eosinophils Relative: 2 %
HCT: 35.3 % — ABNORMAL LOW (ref 36.0–46.0)
Hemoglobin: 10.6 g/dL — ABNORMAL LOW (ref 12.0–15.0)
Immature Granulocytes: 1 %
Lymphocytes Relative: 12 %
Lymphs Abs: 0.9 10*3/uL (ref 0.7–4.0)
MCH: 28.5 pg (ref 26.0–34.0)
MCHC: 30 g/dL (ref 30.0–36.0)
MCV: 94.9 fL (ref 80.0–100.0)
Monocytes Absolute: 0.6 10*3/uL (ref 0.1–1.0)
Monocytes Relative: 8 %
Neutro Abs: 6 10*3/uL (ref 1.7–7.7)
Neutrophils Relative %: 77 %
Platelets: 272 10*3/uL (ref 150–400)
RBC: 3.72 MIL/uL — ABNORMAL LOW (ref 3.87–5.11)
RDW: 15.4 % (ref 11.5–15.5)
WBC: 7.7 10*3/uL (ref 4.0–10.5)
nRBC: 0 % (ref 0.0–0.2)

## 2020-01-28 LAB — IRON AND TIBC
Iron: 19 ug/dL — ABNORMAL LOW (ref 28–170)
Saturation Ratios: 4 % — ABNORMAL LOW (ref 10.4–31.8)
TIBC: 446 ug/dL (ref 250–450)
UIBC: 427 ug/dL

## 2020-01-28 LAB — COMPREHENSIVE METABOLIC PANEL
ALT: 21 U/L (ref 0–44)
AST: 18 U/L (ref 15–41)
Albumin: 4 g/dL (ref 3.5–5.0)
Alkaline Phosphatase: 100 U/L (ref 38–126)
Anion gap: 11 (ref 5–15)
BUN: 16 mg/dL (ref 8–23)
CO2: 27 mmol/L (ref 22–32)
Calcium: 9.5 mg/dL (ref 8.9–10.3)
Chloride: 101 mmol/L (ref 98–111)
Creatinine, Ser: 0.96 mg/dL (ref 0.44–1.00)
GFR calc Af Amer: >60 mL/min (ref >60)
GFR calc non Af Amer: >60 mL/min (ref >60)
Glucose, Bld: 129 mg/dL — ABNORMAL HIGH (ref 70–99)
Potassium: 4.1 mmol/L (ref 3.5–5.1)
Sodium: 139 mmol/L (ref 135–145)
Total Bilirubin: 0.3 mg/dL (ref 0.3–1.2)
Total Protein: 6.7 g/dL (ref 6.5–8.1)

## 2020-01-28 LAB — VITAMIN B12: Vitamin B-12: 214 pg/mL (ref 180–914)

## 2020-01-28 LAB — LACTATE DEHYDROGENASE: LDH: 121 U/L (ref 98–192)

## 2020-01-28 LAB — FOLATE: Folate: 8.7 ng/mL (ref >5.9)

## 2020-01-28 LAB — VITAMIN D 25 HYDROXY (VIT D DEFICIENCY, FRACTURES): Vit D, 25-Hydroxy: 48.92 ng/mL (ref 30–100)

## 2020-01-28 LAB — FERRITIN: Ferritin: 27 ng/mL (ref 11–307)

## 2020-02-02 LAB — COPPER, SERUM: Copper: 136 ug/dL (ref 80–158)

## 2020-02-05 ENCOUNTER — Other Ambulatory Visit: Payer: Self-pay

## 2020-02-05 ENCOUNTER — Inpatient Hospital Stay (HOSPITAL_BASED_OUTPATIENT_CLINIC_OR_DEPARTMENT_OTHER): Payer: Medicare Other | Admitting: Nurse Practitioner

## 2020-02-05 DIAGNOSIS — D509 Iron deficiency anemia, unspecified: Secondary | ICD-10-CM | POA: Diagnosis not present

## 2020-02-05 NOTE — Progress Notes (Signed)
[name], [name] [address]   CLINIC:  Medical Oncology/Hematology  PCP:  Thea Alken [address] [place] [address] [phone]   REASON FOR VISIT: Follow-up for iron deficiency anemia  CURRENT THERAPY: Intermittent iron infusions   INTERVAL HISTORY:  [name] 69 y.o. female was called for a telephone visit today for iron deficiency anemia.  Patient reports she is still fatigued.  She denies any bright red bleeding per rectum or melena.  She denies any easy bruising or bleeding. Denies any nausea, vomiting, or diarrhea. Denies any new pains. Had not noticed any recent bleeding such as epistaxis, hematuria or hematochezia. Denies recent chest pain on exertion, shortness of breath on minimal exertion, pre-syncopal episodes, or palpitations. Denies any numbness or tingling in hands or feet. Denies any recent fevers, infections, or recent hospitalizations. Patient reports appetite at 100% and energy level at 50%.     REVIEW OF SYSTEMS:  Review of Systems  Constitutional: Positive for fatigue.  All other systems reviewed and are negative.    PAST MEDICAL/SURGICAL HISTORY:  Past Medical History:  Diagnosis Date  . Anemia   . Arthritis   . Thyroid disease    Past Surgical History:  Procedure Laterality Date  . ABDOMINAL HYSTERECTOMY    . LUMBAR LAMINECTOMY N/A 2001  . MEDIAL PARTIAL KNEE REPLACEMENT Bilateral 2009 & 2015  . TOTAL HIP ARTHROPLASTY Right 2014  . UMBILICAL HERNIA REPAIR N/A 1955     SOCIAL HISTORY:  Social History   Socioeconomic History  . Marital status: Married    Spouse name: Not on file  . Number of children: 2  . Years of education: Not on file  . Highest education level: Not on file  Occupational History  . Occupation: RETIRED  Tobacco Use  . Smoking status: Never Smoker  . Smokeless tobacco: Never Used  Substance and Sexual Activity  . Alcohol use: Not Currently  . Drug use: Not  Currently  . Sexual activity: Not Currently  Other Topics Concern  . Not on file  Social History Narrative  . Not on file   Social Determinants of Health   Financial Resource Strain: Low Risk   . Difficulty of Paying Living Expenses: Not hard at all  Food Insecurity: No Food Insecurity  . Worried About Charity fundraiser in the Last Year: Never true  . Ran Out of Food in the Last Year: Never true  Transportation Needs: No Transportation Needs  . Lack of Transportation (Medical): No  . Lack of Transportation (Non-Medical): No  Physical Activity: Inactive  . Days of Exercise per Week: 0 days  . Minutes of Exercise per Session: 0 min  Stress: No Stress Concern Present  . Feeling of Stress : Not at all  Social Connections: Slightly Isolated  . Frequency of Communication with Friends and Family: Three times a week  . Frequency of Social Gatherings with Friends and Family: Never  . Attends Religious Services: More than 4 times per year  . Active Member of Clubs or Organizations: No  . Attends Archivist Meetings: Never  . Marital Status: Married  Human resources officer Violence: Not At Risk  . Fear of Current or Ex-Partner: No  . Emotionally Abused: No  . Physically Abused: No  . Sexually Abused: No    FAMILY HISTORY:  Family History  Problem Relation Age of Onset  . Hypertension Mother   . Cancer Mother  Endometrial cancer  . Hypothyroidism Father   . Arthritis Father   . Hypertension Brother   . Heart attack Maternal Grandfather   . Hypertension Son   . Healthy Son     CURRENT MEDICATIONS:  Outpatient Encounter Medications as of 02/05/2020  Medication Sig  . atenolol (TENORMIN) 25 MG tablet Take by mouth daily.  Marland Kitchen azelastine (ASTELIN) 0.1 % nasal spray Place 1 spray into both nostrils 2 (two) times daily.  . cephALEXin (KEFLEX) 500 MG capsule Take 500 mg by mouth 4 (four) times daily.  Marland Kitchen FERROCITE 324 MG TABS tablet Take 1 tablet by mouth daily.  .  fluticasone (FLONASE) 50 MCG/ACT nasal spray Place 1 spray into both nostrils 2 (two) times daily.  Marland Kitchen levothyroxine (SYNTHROID) 100 MCG tablet Take 100 mcg by mouth daily before breakfast.  . Vitamin D, Ergocalciferol, (DRISDOL) 1.25 MG (50000 UT) CAPS capsule Take 50,000 Units by mouth once a week.   No facility-administered encounter medications on file as of 02/05/2020.    ALLERGIES:  Allergies  Allergen Reactions  . Cefuroxime Axetil Nausea And Vomiting  . Skelaxin [Metaxalone] Nausea And Vomiting  . Codeine Palpitations     Vital signs: -Deferred due to telephone visit  Physical Exam -Deferred due to telephone visit -Patient was alert and oriented over the phone and in no acute distress.   LABORATORY DATA:  I have reviewed the labs as listed.  CBC    Component Value Date/Time   WBC 7.7 01/28/2020 1304   RBC 3.72 (L) 01/28/2020 1304   HGB 10.6 (L) 01/28/2020 1304   HCT 35.3 (L) 01/28/2020 1304   PLT 272 01/28/2020 1304   MCV 94.9 01/28/2020 1304   MCH 28.5 01/28/2020 1304   MCHC 30.0 01/28/2020 1304   RDW 15.4 01/28/2020 1304   LYMPHSABS 0.9 01/28/2020 1304   MONOABS 0.6 01/28/2020 1304   EOSABS 0.1 01/28/2020 1304   BASOSABS 0.0 01/28/2020 1304   CMP Latest Ref Rng & Units 01/28/2020 12/03/2019  Glucose 70 - 99 mg/dL 129(H) 103(H)  BUN 8 - 23 mg/dL 16 15  Creatinine 0.44 - 1.00 mg/dL 0.96 0.83  Sodium 135 - 145 mmol/L 139 140  Potassium 3.5 - 5.1 mmol/L 4.1 4.6  Chloride 98 - 111 mmol/L 101 108  CO2 22 - 32 mmol/L 27 24  Calcium 8.9 - 10.3 mg/dL 9.5 9.3  Total Protein 6.5 - 8.1 g/dL 6.7 6.1(L)  Total Bilirubin 0.3 - 1.2 mg/dL 0.3 0.5  Alkaline Phos 38 - 126 U/L 100 100  AST 15 - 41 U/L 18 16  ALT 0 - 44 U/L 21 19    All questions were answered to patient's stated satisfaction. Encouraged patient to call with any new concerns or questions before his next visit to the cancer center and we can certain see him sooner, if needed.      ASSESSMENT & PLAN:    Iron deficiency anemia 1.  Iron deficiency anemia: -Originally noted in 2010 worked up by gastroenterology with endoscopy and colonoscopy. -Anemia thought to be secondary to duodenal ulcers and poor absorption. -Anemia continued, repeated endoscopy and colonoscopy in 2015 did not reveal any evidence of bleeding.  Anemia thought to be secondary to poor absorption. -Repeat endoscopy and colonoscopy in 2019 did not reveal any evidence of bleeding. -Bone marrow biopsy completed in September 2020 showed normal marrow.  No evidence of hematologic neoplasm or morphology.  Normal cytogenetics.  No increase in blasts on flow cytometry. -Patient is unable to tolerate  p.o. iron supplements due to GI upset.  However she is continuing to take iron tablets on and off. -Patient is unable to tolerate Venofer due to flulike symptoms.  She does not respond to IV Ferrlecit. -Patient has been extensively worked up for her anemia.  It appears anemia is most likely secondary to poor GI absorption. -Patient received 2 doses of Injectafer on 10/21/2019 and 10/28/2019.  She received premeds and tolerated well. -Labs done on 12/03/2019 showed SPEP negative.  Creatinine 0.83, vitamin B12 221, WBC 6.5, hemoglobin 10.1, platelets 254. -Patient reports she felt better after her Injectafer infusions but is starting to feel fatigued again. -Labs done on 01/28/2020 showed hemoglobin 10.6, ferritin 27, percent saturation 4 -We will give her 2 more infusions of Feraheme with premeds. -We will follow-up in 2 months with repeat labs.      Orders placed this encounter:  Orders Placed This Encounter  Procedures  . Lactate dehydrogenase  . CBC with Differential/Platelet  . Comprehensive metabolic panel  . Ferritin  . Iron and TIBC  . Vitamin B12  . VITAMIN D 25 Hydroxy (Vit-D Deficiency, Fractures)  . Folate    I provided 20 minutes of non face-to-face telephone visit time during this encounter, and > 50% was spent  counseling as documented under my assessment & plan.    Francene Finders, FNP-C [facility] [phone]

## 2020-02-05 NOTE — Assessment & Plan Note (Addendum)
1.  Iron deficiency anemia: -Originally noted in 2010 worked up by gastroenterology with endoscopy and colonoscopy. -Anemia thought to be secondary to duodenal ulcers and poor absorption. -Anemia continued, repeated endoscopy and colonoscopy in 2015 did not reveal any evidence of bleeding.  Anemia thought to be secondary to poor absorption. -Repeat endoscopy and colonoscopy in 2019 did not reveal any evidence of bleeding. -Bone marrow biopsy completed in September 2020 showed normal marrow.  No evidence of hematologic neoplasm or morphology.  Normal cytogenetics.  No increase in blasts on flow cytometry. -Patient is unable to tolerate p.o. iron supplements due to GI upset.  However she is continuing to take iron tablets on and off. -Patient is unable to tolerate Venofer due to flulike symptoms.  She does not respond to IV Ferrlecit. -Patient has been extensively worked up for her anemia.  It appears anemia is most likely secondary to poor GI absorption. -Patient received 2 doses of Injectafer on 10/21/2019 and 10/28/2019.  She received premeds and tolerated well. -Labs done on 12/03/2019 showed SPEP negative.  Creatinine 0.83, vitamin B12 221, WBC 6.5, hemoglobin 10.1, platelets 254. -Patient reports she felt better after her Injectafer infusions but is starting to feel fatigued again. -Labs done on 01/28/2020 showed hemoglobin 10.6, ferritin 27, percent saturation 4 -We will give her 2 more infusions of Feraheme with premeds. -We will follow-up in 2 months with repeat labs.

## 2020-02-05 NOTE — Patient Instructions (Signed)
[facility] at [facility] Discharge Instructions  Follow up in 2 months with labs    Thank you for choosing [facility] at [facility] to provide your oncology and hematology care.  To afford each patient quality time with our provider, please arrive at least 15 minutes before your scheduled appointment time.   If you have a lab appointment with the Cancer Center please come in thru the Main Entrance and check in at the main information desk.  You need to re-schedule your appointment should you arrive 10 or more minutes late.  We strive to give you quality time with our providers, and arriving late affects you and other patients whose appointments are after yours.  Also, if you no show three or more times for appointments you may be dismissed from the clinic at the providers discretion.     Again, thank you for choosing [facility].  Our hope is that these requests will decrease the amount of time that you wait before being seen by our physicians.       _____________________________________________________________  Should you have questions after your visit to [facility], please contact our office at [phone] between the hours of 8:00 a.m. and 4:30 p.m.  Voicemails left after 4:00 p.m. will not be returned until the following business day.  For prescription refill requests, have your pharmacy contact our office and allow 72 hours.    Due to Covid, you will need to wear a mask upon entering the hospital. If you do not have a mask, a mask will be given to you at the Main Entrance upon arrival. For doctor visits, patients may have 1 support person with them. For treatment visits, patients can not have anyone with them due to social distancing guidelines and our immunocompromised population.      

## 2020-02-09 ENCOUNTER — Other Ambulatory Visit: Payer: Self-pay

## 2020-02-09 ENCOUNTER — Inpatient Hospital Stay (HOSPITAL_COMMUNITY): Payer: Medicare Other

## 2020-02-09 VITALS — BP 125/46 | HR 65 | Temp 97.5°F | Resp 18

## 2020-02-09 DIAGNOSIS — D509 Iron deficiency anemia, unspecified: Secondary | ICD-10-CM | POA: Diagnosis not present

## 2020-02-09 MED ORDER — SODIUM CHLORIDE 0.9% FLUSH: 10.0000 mL | Freq: Once | INTRAVENOUS | Status: DC | PRN

## 2020-02-09 MED ORDER — LORATADINE 10 MG PO TABS
10.0000 mg | ORAL_TABLET | Freq: Once | ORAL | Status: AC
  Administered 2020-02-09: 10 mg via ORAL
  Filled 2020-02-09: qty 1

## 2020-02-09 MED ORDER — METHYLPREDNISOLONE SODIUM SUCC 125 MG IJ SOLR
125.0000 mg | Freq: Once | INTRAMUSCULAR | Status: AC
  Administered 2020-02-09: 125 mg via INTRAVENOUS
  Filled 2020-02-09: qty 2

## 2020-02-09 MED ORDER — ALTEPLASE 2 MG IJ SOLR: 2.0000 mg | Freq: Once | INTRAMUSCULAR | Status: DC | PRN

## 2020-02-09 MED ORDER — SODIUM CHLORIDE 0.9% FLUSH: 3.0000 mL | Freq: Once | INTRAVENOUS | Status: DC | PRN

## 2020-02-09 MED ORDER — HEPARIN SOD (PORK) LOCK FLUSH 100 UNIT/ML IV SOLN: 250.0000 [IU] | Freq: Once | INTRAVENOUS | Status: DC | PRN

## 2020-02-09 MED ORDER — SODIUM CHLORIDE 0.9 % IV SOLN
510.0000 mg | Freq: Once | INTRAVENOUS | Status: AC
  Administered 2020-02-09: 510 mg via INTRAVENOUS
  Filled 2020-02-09: qty 510

## 2020-02-09 MED ORDER — SODIUM CHLORIDE 0.9 % IV SOLN: Freq: Once | INTRAVENOUS | Status: AC

## 2020-02-09 MED ORDER — ONDANSETRON HCL 4 MG/2ML IJ SOLN
4.0000 mg | Freq: Once | INTRAMUSCULAR | Status: AC
  Administered 2020-02-09: 4 mg via INTRAVENOUS
  Filled 2020-02-09: qty 2

## 2020-02-09 MED ORDER — HEPARIN SOD (PORK) LOCK FLUSH 100 UNIT/ML IV SOLN: 500.0000 [IU] | Freq: Once | INTRAVENOUS | Status: DC | PRN

## 2020-02-09 NOTE — Patient Instructions (Signed)
[facility] at [facility]  Discharge Instructions:   _______________________________________________________________  Thank you for choosing [facility] at [facility] to provide your oncology and hematology care.  To afford each patient quality time with our providers, please arrive at least 15 minutes before your scheduled appointment.  You need to re-schedule your appointment if you arrive 10 or more minutes late.  We strive to give you quality time with our providers, and arriving late affects you and other patients whose appointments are after yours.  Also, if you no show three or more times for appointments you may be dismissed from the clinic.  Again, thank you for choosing [facility] at [facility]. Our hope is that these requests will allow you access to exceptional care and in a timely manner. _______________________________________________________________  If you have questions after your visit, please contact our office at [phone] between the hours of 8:30 a.m. and 5:00 p.m. Voicemails left after 4:30 p.m. will not be returned until the following business day. _______________________________________________________________  For prescription refill requests, have your pharmacy contact our office. _______________________________________________________________  Recommendations made by the consultant and any test results will be sent to your referring physician. _______________________________________________________________ 

## 2020-02-09 NOTE — Progress Notes (Signed)
Patient presents today for Feraheme infusion. MAR reviewed and updated. Vital signs stable. Patient states RLockamy NP planned to give her steroids with her Feraheme infusion. NP notified . Patient states she has felt tired and has seasonal allergies.   Feraheme given today per MD orders. Tolerated infusion without adverse affects. Vital signs stable. No complaints at this time. Discharged from clinic ambulatory. F/U with [facility] as scheduled.

## 2020-02-09 NOTE — Progress Notes (Signed)
Premeds for Feraheme clarified:  Loratadine 10 mg po x 1 Solu-Medrol 125 mg IV x 1 Zofran 4 mg IV x 1  V.O. Francene Finders, NP/Thomasina Housley Ronnald Ramp, PharmD

## 2020-02-16 ENCOUNTER — Other Ambulatory Visit: Payer: Self-pay

## 2020-02-16 ENCOUNTER — Encounter (HOSPITAL_COMMUNITY): Payer: Self-pay

## 2020-02-16 ENCOUNTER — Inpatient Hospital Stay (HOSPITAL_COMMUNITY): Payer: Medicare Other

## 2020-02-16 VITALS — BP 116/50 | HR 70 | Temp 98.1°F | Resp 18

## 2020-02-16 DIAGNOSIS — D509 Iron deficiency anemia, unspecified: Secondary | ICD-10-CM | POA: Diagnosis not present

## 2020-02-16 MED ORDER — METHYLPREDNISOLONE SODIUM SUCC 125 MG IJ SOLR
125.0000 mg | Freq: Once | INTRAMUSCULAR | Status: AC
  Administered 2020-02-16: 125 mg via INTRAVENOUS
  Filled 2020-02-16: qty 2

## 2020-02-16 MED ORDER — LORATADINE 10 MG PO TABS
10.0000 mg | ORAL_TABLET | Freq: Once | ORAL | Status: AC
  Administered 2020-02-16: 14:00:00 10 mg via ORAL
  Filled 2020-02-16: qty 1

## 2020-02-16 MED ORDER — SODIUM CHLORIDE 0.9 % IV SOLN: Freq: Once | INTRAVENOUS | Status: AC

## 2020-02-16 MED ORDER — SODIUM CHLORIDE 0.9 % IV SOLN
510.0000 mg | Freq: Once | INTRAVENOUS | Status: AC
  Administered 2020-02-16: 510 mg via INTRAVENOUS
  Filled 2020-02-16: qty 510

## 2020-02-16 MED ORDER — ONDANSETRON HCL 4 MG/2ML IJ SOLN
4.0000 mg | Freq: Once | INTRAMUSCULAR | Status: AC
  Administered 2020-02-16: 14:00:00 4 mg via INTRAVENOUS
  Filled 2020-02-16: qty 2

## 2020-02-16 NOTE — Progress Notes (Signed)
Patient tolerated iron infusion with no complaints voiced.  Peripheral IV site clean and dry with good blood return noted before and after infusion.  Band aid applied.  VSS with discharge and left ambulatory with no s/s of distress noted.  

## 2020-04-04 ENCOUNTER — Inpatient Hospital Stay (HOSPITAL_COMMUNITY): Payer: Medicare Other | Attending: Hematology

## 2020-04-04 ENCOUNTER — Other Ambulatory Visit: Payer: Self-pay

## 2020-04-04 DIAGNOSIS — Z8249 Family history of ischemic heart disease and other diseases of the circulatory system: Secondary | ICD-10-CM | POA: Insufficient documentation

## 2020-04-04 DIAGNOSIS — Z8349 Family history of other endocrine, nutritional and metabolic diseases: Secondary | ICD-10-CM | POA: Diagnosis not present

## 2020-04-04 DIAGNOSIS — Z90710 Acquired absence of both cervix and uterus: Secondary | ICD-10-CM | POA: Insufficient documentation

## 2020-04-04 DIAGNOSIS — Z8049 Family history of malignant neoplasm of other genital organs: Secondary | ICD-10-CM | POA: Insufficient documentation

## 2020-04-04 DIAGNOSIS — Z79899 Other long term (current) drug therapy: Secondary | ICD-10-CM | POA: Insufficient documentation

## 2020-04-04 DIAGNOSIS — E079 Disorder of thyroid, unspecified: Secondary | ICD-10-CM | POA: Insufficient documentation

## 2020-04-04 DIAGNOSIS — D509 Iron deficiency anemia, unspecified: Secondary | ICD-10-CM | POA: Diagnosis present

## 2020-04-04 LAB — COMPREHENSIVE METABOLIC PANEL
ALT: 19 U/L (ref 0–44)
AST: 16 U/L (ref 15–41)
Albumin: 3.8 g/dL (ref 3.5–5.0)
Alkaline Phosphatase: 86 U/L (ref 38–126)
Anion gap: 7 (ref 5–15)
BUN: 13 mg/dL (ref 8–23)
CO2: 25 mmol/L (ref 22–32)
Calcium: 9.2 mg/dL (ref 8.9–10.3)
Chloride: 107 mmol/L (ref 98–111)
Creatinine, Ser: 0.75 mg/dL (ref 0.44–1.00)
GFR calc Af Amer: >60 mL/min (ref >60)
GFR calc non Af Amer: >60 mL/min (ref >60)
Glucose, Bld: 132 mg/dL — ABNORMAL HIGH (ref 70–99)
Potassium: 4.2 mmol/L (ref 3.5–5.1)
Sodium: 139 mmol/L (ref 135–145)
Total Bilirubin: 0.3 mg/dL (ref 0.3–1.2)
Total Protein: 6.3 g/dL — ABNORMAL LOW (ref 6.5–8.1)

## 2020-04-04 LAB — CBC WITH DIFFERENTIAL/PLATELET
Abs Immature Granulocytes: 0.03 10*3/uL (ref 0.00–0.07)
Basophils Absolute: 0 10*3/uL (ref 0.0–0.1)
Basophils Relative: 1 %
Eosinophils Absolute: 0.3 10*3/uL (ref 0.0–0.5)
Eosinophils Relative: 5 %
HCT: 30.9 % — ABNORMAL LOW (ref 36.0–46.0)
Hemoglobin: 9 g/dL — ABNORMAL LOW (ref 12.0–15.0)
Immature Granulocytes: 1 %
Lymphocytes Relative: 14 %
Lymphs Abs: 0.8 10*3/uL (ref 0.7–4.0)
MCH: 25.6 pg — ABNORMAL LOW (ref 26.0–34.0)
MCHC: 29.1 g/dL — ABNORMAL LOW (ref 30.0–36.0)
MCV: 87.8 fL (ref 80.0–100.0)
Monocytes Absolute: 0.6 10*3/uL (ref 0.1–1.0)
Monocytes Relative: 9 %
Neutro Abs: 4.3 10*3/uL (ref 1.7–7.7)
Neutrophils Relative %: 70 %
Platelets: 210 10*3/uL (ref 150–400)
RBC: 3.52 MIL/uL — ABNORMAL LOW (ref 3.87–5.11)
RDW: 15.9 % — ABNORMAL HIGH (ref 11.5–15.5)
WBC: 6.1 10*3/uL (ref 4.0–10.5)
nRBC: 0 % (ref 0.0–0.2)

## 2020-04-04 LAB — FERRITIN: Ferritin: 8 ng/mL — ABNORMAL LOW (ref 11–307)

## 2020-04-04 LAB — IRON AND TIBC
Iron: 20 ug/dL — ABNORMAL LOW (ref 28–170)
Saturation Ratios: 4 % — ABNORMAL LOW (ref 10.4–31.8)
TIBC: 446 ug/dL (ref 250–450)
UIBC: 426 ug/dL

## 2020-04-04 LAB — VITAMIN D 25 HYDROXY (VIT D DEFICIENCY, FRACTURES): Vit D, 25-Hydroxy: 46.16 ng/mL (ref 30–100)

## 2020-04-04 LAB — LACTATE DEHYDROGENASE: LDH: 126 U/L (ref 98–192)

## 2020-04-04 LAB — VITAMIN B12: Vitamin B-12: 227 pg/mL (ref 180–914)

## 2020-04-04 LAB — FOLATE: Folate: 13.3 ng/mL (ref >5.9)

## 2020-04-07 ENCOUNTER — Other Ambulatory Visit (HOSPITAL_COMMUNITY): Payer: Medicare Other

## 2020-04-14 ENCOUNTER — Inpatient Hospital Stay (HOSPITAL_BASED_OUTPATIENT_CLINIC_OR_DEPARTMENT_OTHER): Payer: Medicare Other | Admitting: Nurse Practitioner

## 2020-04-14 ENCOUNTER — Other Ambulatory Visit: Payer: Self-pay

## 2020-04-14 DIAGNOSIS — D509 Iron deficiency anemia, unspecified: Secondary | ICD-10-CM

## 2020-04-14 NOTE — Assessment & Plan Note (Signed)
1.  Iron deficiency anemia: -Originally noted in 2010 worked up by gastroenterology with endoscopy and colonoscopy. -Anemia thought to be secondary to duodenal ulcers and poor absorption. -Anemia continued, repeated endoscopy and colonoscopy in 2015 did not reveal any evidence of bleeding.  Anemia thought to be secondary to poor absorption. -Repeat endoscopy and colonoscopy in 2019 did not reveal any evidence of bleeding. -Bone marrow biopsy completed in September 2020 showed normal marrow.  No evidence of hematologic neoplasm or morphology.  Normal cytogenetics.  No increase in blasts on flow cytometry. -Patient is unable to tolerate p.o. iron supplements due to GI upset.  However she is continuing to take iron tablets on and off. -Patient is unable to tolerate Venofer due to flulike symptoms.  She does not respond to IV Ferrlecit. -Patient has been extensively worked up for her anemia.  It appears anemia is most likely secondary to poor GI absorption. -Patient received 2 doses of Injectafer on 10/21/2019 and 10/28/2019.  She received premeds and tolerated well. -Labs done on 12/03/2019 showed SPEP negative.  Creatinine 0.83, vitamin B12 221, WBC 6.5, hemoglobin 10.1, platelets 254. -Patient reports she felt better after her Injectafer infusions but is starting to feel fatigued again. -Labs done on 04/04/2020 showed hemoglobin 9.0, ferritin 8, percent saturation 4 -We will give her 3 more infusions of Feraheme with premeds. -She reports dark stools.  We will refer her to a GI here in [place].  She was seeing GI in [place] and they all retired. -We will follow-up in 2 months with repeat labs.

## 2020-04-14 NOTE — Progress Notes (Signed)
[name], [place] [address]   CLINIC:  Medical Oncology/Hematology  PCP:  Thea Alken [address] [place] [address] [phone]   REASON FOR VISIT: Follow-up for iron deficiency anemia   CURRENT THERAPY: Intermittent iron infusions   INTERVAL HISTORY:  Annette Frank 69 y.o. female returns for routine follow-up for iron deficiency anemia.  Patient reports she has been feeling very weak and fatigued lately.  She does report dark stools.  She reports her GI doctors in [place] have retired and she wishes to see one here in [place].  She denies any bright red bleeding per rectum. Denies any nausea, vomiting, or diarrhea. Denies any new pains. Had not noticed any recent bleeding such as epistaxis, hematuria or hematochezia. Denies recent chest pain on exertion, shortness of breath on minimal exertion, pre-syncopal episodes, or palpitations. Denies any numbness or tingling in hands or feet. Denies any recent fevers, infections, or recent hospitalizations. Patient reports appetite at 100% and energy level at 25%.  She is eating well maintain her weight at this time.     REVIEW OF SYSTEMS:  Review of Systems  Constitutional: Positive for fatigue.  Respiratory: Positive for shortness of breath.   Gastrointestinal: Positive for constipation.  Neurological: Positive for headaches.  Psychiatric/Behavioral: Positive for sleep disturbance.  All other systems reviewed and are negative.    PAST MEDICAL/SURGICAL HISTORY:  Past Medical History:  Diagnosis Date  . Anemia   . Arthritis   . Thyroid disease    Past Surgical History:  Procedure Laterality Date  . ABDOMINAL HYSTERECTOMY    . LUMBAR LAMINECTOMY N/A 2001  . MEDIAL PARTIAL KNEE REPLACEMENT Bilateral 2009 & 2015  . TOTAL HIP ARTHROPLASTY Right 2014  . UMBILICAL HERNIA REPAIR N/A 1955     SOCIAL HISTORY:  Social History   Socioeconomic History  . Marital status:  Married    Spouse name: Not on file  . Number of children: 2  . Years of education: Not on file  . Highest education level: Not on file  Occupational History  . Occupation: RETIRED  Tobacco Use  . Smoking status: Never Smoker  . Smokeless tobacco: Never Used  Vaping Use  . Vaping Use: Never used  Substance and Sexual Activity  . Alcohol use: Not Currently  . Drug use: Not Currently  . Sexual activity: Not Currently  Other Topics Concern  . Not on file  Social History Narrative  . Not on file   Social Determinants of Health   Financial Resource Strain: Low Risk   . Difficulty of Paying Living Expenses: Not hard at all  Food Insecurity: No Food Insecurity  . Worried About Charity fundraiser in the Last Year: Never true  . Ran Out of Food in the Last Year: Never true  Transportation Needs: No Transportation Needs  . Lack of Transportation (Medical): No  . Lack of Transportation (Non-Medical): No  Physical Activity: Inactive  . Days of Exercise per Week: 0 days  . Minutes of Exercise per Session: 0 min  Stress: No Stress Concern Present  . Feeling of Stress : Not at all  Social Connections: Moderately Integrated  . Frequency of Communication with Friends and Family: Three times a week  . Frequency of Social Gatherings with Friends and Family: Never  . Attends Religious Services: More than 4 times per year  . Active Member of Clubs or Organizations: No  . Attends Archivist Meetings: Never  .  Marital Status: Married  Human resources officer Violence: Not At Risk  . Fear of Current or Ex-Partner: No  . Emotionally Abused: No  . Physically Abused: No  . Sexually Abused: No    FAMILY HISTORY:  Family History  Problem Relation Age of Onset  . Hypertension Mother   . Cancer Mother        Endometrial cancer  . Hypothyroidism Father   . Arthritis Father   . Hypertension Brother   . Heart attack Maternal Grandfather   . Hypertension Son   . Healthy Son      CURRENT MEDICATIONS:  Outpatient Encounter Medications as of 04/14/2020  Medication Sig  . atenolol (TENORMIN) 25 MG tablet Take by mouth daily.  Marland Kitchen azelastine (ASTELIN) 0.1 % nasal spray Place 1 spray into both nostrils 2 (two) times daily.  . fluticasone (FLONASE) 50 MCG/ACT nasal spray Place 1 spray into both nostrils 2 (two) times daily.  Marland Kitchen levothyroxine (SYNTHROID) 100 MCG tablet Take 100 mcg by mouth daily before breakfast.  . Vitamin D, Ergocalciferol, (DRISDOL) 1.25 MG (50000 UT) CAPS capsule Take 50,000 Units by mouth once a week.  . [DISCONTINUED] FERROCITE 324 MG TABS tablet Take 1 tablet by mouth daily.  Marland Kitchen albuterol (VENTOLIN HFA) 108 (90 Base) MCG/ACT inhaler  (Patient not taking: Reported on 04/14/2020)  . cephALEXin (KEFLEX) 500 MG capsule Take 500 mg by mouth 4 (four) times daily. (Patient not taking: Reported on 04/14/2020)   No facility-administered encounter medications on file as of 04/14/2020.    ALLERGIES:  Allergies  Allergen Reactions  . Cefuroxime Axetil Nausea And Vomiting  . Skelaxin [Metaxalone] Nausea And Vomiting  . Codeine Palpitations     PHYSICAL EXAM:  ECOG Performance status: 1  Vitals:   04/14/20 1105  BP: (!) 147/54  Pulse: 73  Resp: 19  Temp: (!) 97.1 F (36.2 C)  SpO2: 100%   Filed Weights   04/14/20 1105  Weight: 240 lb (108.9 kg)   Physical Exam Constitutional:      Appearance: She is normal weight.  Cardiovascular:     Rate and Rhythm: Normal rate and regular rhythm.     Heart sounds: Normal heart sounds.  Pulmonary:     Effort: Pulmonary effort is normal.     Breath sounds: Normal breath sounds.  Abdominal:     General: Bowel sounds are normal.     Palpations: Abdomen is soft.  Musculoskeletal:        General: Normal range of motion.  Skin:    General: Skin is warm.  Neurological:     Mental Status: She is alert and oriented to person, place, and time. Mental status is at baseline.  Psychiatric:        Mood and  Affect: Mood normal.        Behavior: Behavior normal.        Thought Content: Thought content normal.        Judgment: Judgment normal.      LABORATORY DATA:  I have reviewed the labs as listed.  CBC    Component Value Date/Time   WBC 6.1 04/04/2020 1030   RBC 3.52 (L) 04/04/2020 1030   HGB 9.0 (L) 04/04/2020 1030   HCT 30.9 (L) 04/04/2020 1030   PLT 210 04/04/2020 1030   MCV 87.8 04/04/2020 1030   MCH 25.6 (L) 04/04/2020 1030   MCHC 29.1 (L) 04/04/2020 1030   RDW 15.9 (H) 04/04/2020 1030   LYMPHSABS 0.8 04/04/2020 1030   MONOABS 0.6  04/04/2020 1030   EOSABS 0.3 04/04/2020 1030   BASOSABS 0.0 04/04/2020 1030   CMP Latest Ref Rng & Units 04/04/2020 01/28/2020 12/03/2019  Glucose 70 - 99 mg/dL 132(H) 129(H) 103(H)  BUN 8 - 23 mg/dL 13 16 15   Creatinine 0.44 - 1.00 mg/dL 0.75 0.96 0.83  Sodium 135 - 145 mmol/L 139 139 140  Potassium 3.5 - 5.1 mmol/L 4.2 4.1 4.6  Chloride 98 - 111 mmol/L 107 101 108  CO2 22 - 32 mmol/L 25 27 24   Calcium 8.9 - 10.3 mg/dL 9.2 9.5 9.3  Total Protein 6.5 - 8.1 g/dL 6.3(L) 6.7 6.1(L)  Total Bilirubin 0.3 - 1.2 mg/dL 0.3 0.3 0.5  Alkaline Phos 38 - 126 U/L 86 100 100  AST 15 - 41 U/L 16 18 16   ALT 0 - 44 U/L 19 21 19     All questions were answered to patient's stated satisfaction. Encouraged patient to call with any new concerns or questions before his next visit to the cancer center and we can certain see him sooner, if needed.     ASSESSMENT & PLAN:  Iron deficiency anemia 1.  Iron deficiency anemia: -Originally noted in 2010 worked up by gastroenterology with endoscopy and colonoscopy. -Anemia thought to be secondary to duodenal ulcers and poor absorption. -Anemia continued, repeated endoscopy and colonoscopy in 2015 did not reveal any evidence of bleeding.  Anemia thought to be secondary to poor absorption. -Repeat endoscopy and colonoscopy in 2019 did not reveal any evidence of bleeding. -Bone marrow biopsy completed in September 2020  showed normal marrow.  No evidence of hematologic neoplasm or morphology.  Normal cytogenetics.  No increase in blasts on flow cytometry. -Patient is unable to tolerate p.o. iron supplements due to GI upset.  However she is continuing to take iron tablets on and off. -Patient is unable to tolerate Venofer due to flulike symptoms.  She does not respond to IV Ferrlecit. -Patient has been extensively worked up for her anemia.  It appears anemia is most likely secondary to poor GI absorption. -Patient received 2 doses of Injectafer on 10/21/2019 and 10/28/2019.  She received premeds and tolerated well. -Labs done on 12/03/2019 showed SPEP negative.  Creatinine 0.83, vitamin B12 221, WBC 6.5, hemoglobin 10.1, platelets 254. -Patient reports she felt better after her Injectafer infusions but is starting to feel fatigued again. -Labs done on 04/04/2020 showed hemoglobin 9.0, ferritin 8, percent saturation 4 -We will give her 3 more infusions of Feraheme with premeds. -She reports dark stools.  We will refer her to a GI here in [place].  She was seeing GI in [place] and they all retired. -We will follow-up in 2 months with repeat labs.     Orders placed this encounter:  Orders Placed This Encounter  Procedures  . Lactate dehydrogenase  . CBC with Differential/Platelet  . Comprehensive metabolic panel  . Ferritin  . Iron and TIBC  . Vitamin B12  . VITAMIN D 25 Hydroxy (Vit-D Deficiency, Fractures)  . Folate  . Protein electrophoresis, serum  . Direct antiglobulin test (not at [facility])      Francene Finders, [place] [phone]

## 2020-04-20 ENCOUNTER — Encounter (INDEPENDENT_AMBULATORY_CARE_PROVIDER_SITE_OTHER): Payer: Self-pay | Admitting: Gastroenterology

## 2020-04-21 ENCOUNTER — Inpatient Hospital Stay (HOSPITAL_COMMUNITY): Payer: Medicare Other

## 2020-04-21 ENCOUNTER — Encounter (HOSPITAL_COMMUNITY): Payer: Self-pay

## 2020-04-21 ENCOUNTER — Other Ambulatory Visit: Payer: Self-pay

## 2020-04-21 VITALS — BP 138/74 | HR 71 | Temp 98.2°F | Resp 18

## 2020-04-21 DIAGNOSIS — D509 Iron deficiency anemia, unspecified: Secondary | ICD-10-CM

## 2020-04-21 MED ORDER — ONDANSETRON HCL 4 MG/2ML IJ SOLN
4.0000 mg | Freq: Once | INTRAMUSCULAR | Status: AC
  Administered 2020-04-21: 4 mg via INTRAVENOUS

## 2020-04-21 MED ORDER — SODIUM CHLORIDE 0.9 % IV SOLN
510.0000 mg | Freq: Once | INTRAVENOUS | Status: AC
  Administered 2020-04-21: 510 mg via INTRAVENOUS
  Filled 2020-04-21: qty 510

## 2020-04-21 MED ORDER — LORATADINE 10 MG PO TABS
ORAL_TABLET | ORAL | Status: AC
  Filled 2020-04-21: qty 1

## 2020-04-21 MED ORDER — SODIUM CHLORIDE 0.9 % IV SOLN: Freq: Once | INTRAVENOUS | Status: AC

## 2020-04-21 MED ORDER — ONDANSETRON HCL 4 MG/2ML IJ SOLN
INTRAMUSCULAR | Status: AC
  Filled 2020-04-21: qty 2

## 2020-04-21 MED ORDER — METHYLPREDNISOLONE SODIUM SUCC 125 MG IJ SOLR
INTRAMUSCULAR | Status: AC
  Filled 2020-04-21: qty 2

## 2020-04-21 MED ORDER — LORATADINE 10 MG PO TABS
10.0000 mg | ORAL_TABLET | Freq: Once | ORAL | Status: AC
  Administered 2020-04-21: 10 mg via ORAL

## 2020-04-21 MED ORDER — METHYLPREDNISOLONE SODIUM SUCC 125 MG IJ SOLR
125.0000 mg | Freq: Once | INTRAMUSCULAR | Status: AC
  Administered 2020-04-21: 125 mg via INTRAVENOUS

## 2020-04-21 NOTE — Progress Notes (Signed)
Patient presents today for Feraheme infusion. Patient has no complaints of any changes since her last visit. Vital signs are stable. Patient denies pain today.   Feraheme given today per MD orders. Tolerated infusion without adverse affects. Vital signs stable. No complaints at this time. Discharged from clinic ambulatory. F/U with [facility] as scheduled.

## 2020-04-21 NOTE — Patient Instructions (Signed)
[facility] at [facility]  Discharge Instructions:   _______________________________________________________________  Thank you for choosing [facility] at [facility] to provide your oncology and hematology care.  To afford each patient quality time with our providers, please arrive at least 15 minutes before your scheduled appointment.  You need to re-schedule your appointment if you arrive 10 or more minutes late.  We strive to give you quality time with our providers, and arriving late affects you and other patients whose appointments are after yours.  Also, if you no show three or more times for appointments you may be dismissed from the clinic.  Again, thank you for choosing [facility] at [facility]. Our hope is that these requests will allow you access to exceptional care and in a timely manner. _______________________________________________________________  If you have questions after your visit, please contact our office at [phone] between the hours of 8:30 a.m. and 5:00 p.m. Voicemails left after 4:30 p.m. will not be returned until the following business day. _______________________________________________________________  For prescription refill requests, have your pharmacy contact our office. _______________________________________________________________  Recommendations made by the consultant and any test results will be sent to your referring physician. _______________________________________________________________ 

## 2020-04-28 ENCOUNTER — Encounter (HOSPITAL_COMMUNITY): Payer: Self-pay

## 2020-04-28 ENCOUNTER — Inpatient Hospital Stay (HOSPITAL_COMMUNITY): Payer: Medicare Other | Attending: Hematology

## 2020-04-28 VITALS — BP 130/59 | HR 66 | Temp 98.5°F | Resp 18

## 2020-04-28 DIAGNOSIS — D509 Iron deficiency anemia, unspecified: Secondary | ICD-10-CM | POA: Insufficient documentation

## 2020-04-28 MED ORDER — SODIUM CHLORIDE 0.9 % IV SOLN: Freq: Once | INTRAVENOUS | Status: AC

## 2020-04-28 MED ORDER — METHYLPREDNISOLONE SODIUM SUCC 125 MG IJ SOLR
125.0000 mg | Freq: Once | INTRAMUSCULAR | Status: AC
  Administered 2020-04-28: 125 mg via INTRAVENOUS
  Filled 2020-04-28: qty 2

## 2020-04-28 MED ORDER — LORATADINE 10 MG PO TABS
10.0000 mg | ORAL_TABLET | Freq: Once | ORAL | Status: AC
  Administered 2020-04-28: 10 mg via ORAL
  Filled 2020-04-28: qty 1

## 2020-04-28 MED ORDER — ONDANSETRON HCL 4 MG/2ML IJ SOLN
4.0000 mg | Freq: Once | INTRAMUSCULAR | Status: AC
  Administered 2020-04-28: 4 mg via INTRAVENOUS
  Filled 2020-04-28: qty 2

## 2020-04-28 MED ORDER — SODIUM CHLORIDE 0.9 % IV SOLN
510.0000 mg | Freq: Once | INTRAVENOUS | Status: AC
  Administered 2020-04-28: 510 mg via INTRAVENOUS
  Filled 2020-04-28: qty 17

## 2020-04-28 NOTE — Progress Notes (Signed)
Annette Frank tolerated Feraheme infusion well without complaints or incident. Peripheral IV site checked with positive blood return noted prior to and after infusion. VSS upon discharge. Pt discharged self ambulatory in satisfactory condition

## 2020-04-28 NOTE — Patient Instructions (Signed)
[facility] at [facility] Discharge Instructions  Received Feraheme infusion today. Follow-up as scheduled   Thank you for choosing [facility] at [facility] to provide your oncology and hematology care.  To afford each patient quality time with our provider, please arrive at least 15 minutes before your scheduled appointment time.   If you have a lab appointment with the Cancer Center please come in thru the Main Entrance and check in at the main information desk.  You need to re-schedule your appointment should you arrive 10 or more minutes late.  We strive to give you quality time with our providers, and arriving late affects you and other patients whose appointments are after yours.  Also, if you no show three or more times for appointments you may be dismissed from the clinic at the providers discretion.     Again, thank you for choosing [facility].  Our hope is that these requests will decrease the amount of time that you wait before being seen by our physicians.       _____________________________________________________________  Should you have questions after your visit to [facility], please contact our office at [phone] between the hours of 8:00 a.m. and 4:30 p.m.  Voicemails left after 4:00 p.m. will not be returned until the following business day.  For prescription refill requests, have your pharmacy contact our office and allow 72 hours.    Due to Covid, you will need to wear a mask upon entering the hospital. If you do not have a mask, a mask will be given to you at the Main Entrance upon arrival. For doctor visits, patients may have 1 support person with them. For treatment visits, patients can not have anyone with them due to social distancing guidelines and our immunocompromised population.     

## 2020-05-05 ENCOUNTER — Inpatient Hospital Stay (HOSPITAL_COMMUNITY): Payer: Medicare Other

## 2020-05-05 ENCOUNTER — Encounter (HOSPITAL_COMMUNITY): Payer: Self-pay

## 2020-05-05 VITALS — BP 110/69 | HR 65 | Temp 97.7°F | Resp 18

## 2020-05-05 DIAGNOSIS — D509 Iron deficiency anemia, unspecified: Secondary | ICD-10-CM | POA: Diagnosis not present

## 2020-05-05 MED ORDER — METHYLPREDNISOLONE SODIUM SUCC 125 MG IJ SOLR
INTRAMUSCULAR | Status: AC
  Filled 2020-05-05: qty 2

## 2020-05-05 MED ORDER — LORATADINE 10 MG PO TABS
ORAL_TABLET | ORAL | Status: AC
  Filled 2020-05-05: qty 1

## 2020-05-05 MED ORDER — METHYLPREDNISOLONE SODIUM SUCC 125 MG IJ SOLR
125.0000 mg | Freq: Once | INTRAMUSCULAR | Status: AC
  Administered 2020-05-05: 125 mg via INTRAVENOUS

## 2020-05-05 MED ORDER — SODIUM CHLORIDE 0.9 % IV SOLN
510.0000 mg | Freq: Once | INTRAVENOUS | Status: AC
  Administered 2020-05-05: 510 mg via INTRAVENOUS
  Filled 2020-05-05: qty 510

## 2020-05-05 MED ORDER — LORATADINE 10 MG PO TABS
10.0000 mg | ORAL_TABLET | Freq: Once | ORAL | Status: AC
  Administered 2020-05-05: 10 mg via ORAL

## 2020-05-05 MED ORDER — SODIUM CHLORIDE 0.9 % IV SOLN: Freq: Once | INTRAVENOUS | Status: AC

## 2020-05-05 MED ORDER — ONDANSETRON HCL 4 MG/2ML IJ SOLN
INTRAMUSCULAR | Status: AC
  Filled 2020-05-05: qty 2

## 2020-05-05 MED ORDER — ONDANSETRON HCL 4 MG/2ML IJ SOLN
4.0000 mg | Freq: Once | INTRAMUSCULAR | Status: AC
  Administered 2020-05-05: 4 mg via INTRAVENOUS

## 2020-05-05 NOTE — Patient Instructions (Signed)
[facility]  Discharge Instructions:   _______________________________________________________________  Thank you for choosing [facility] at [facility] to provide your oncology and hematology care.  To afford each patient quality time with our providers, please arrive at least 15 minutes before your scheduled appointment.  You need to re-schedule your appointment if you arrive 10 or more minutes late.  We strive to give you quality time with our providers, and arriving late affects you and other patients whose appointments are after yours.  Also, if you no show three or more times for appointments you may be dismissed from the clinic.  Again, thank you for choosing [facility] Our hope is that these requests will allow you access to exceptional care and in a timely manner. _______________________________________________________________  If you have questions after your visit, please contact our office at [phone] between the hours of 8:30 a.m. and 5:00 p.m. Voicemails left after 4:30 p.m. will not be returned until the following business day. _______________________________________________________________  For prescription refill requests, have your pharmacy contact our office. _______________________________________________________________  Recommendations made by the consultant and any test results will be sent to your referring physician. _______________________________________________________________ 

## 2020-05-05 NOTE — Progress Notes (Signed)
Feraheme given today per MD orders. Tolerated infusion without adverse affects. Vital signs stable. No complaints at this time. Discharged from clinic ambulatory. F/U with [facility] as scheduled.  

## 2020-05-19 ENCOUNTER — Encounter (INDEPENDENT_AMBULATORY_CARE_PROVIDER_SITE_OTHER): Payer: Self-pay | Admitting: *Deleted

## 2020-05-19 ENCOUNTER — Other Ambulatory Visit (INDEPENDENT_AMBULATORY_CARE_PROVIDER_SITE_OTHER): Payer: Self-pay | Admitting: *Deleted

## 2020-05-19 ENCOUNTER — Ambulatory Visit (INDEPENDENT_AMBULATORY_CARE_PROVIDER_SITE_OTHER): Payer: Medicare Other | Admitting: Gastroenterology

## 2020-05-19 ENCOUNTER — Other Ambulatory Visit: Payer: Self-pay

## 2020-05-19 ENCOUNTER — Encounter (INDEPENDENT_AMBULATORY_CARE_PROVIDER_SITE_OTHER): Payer: Self-pay | Admitting: Gastroenterology

## 2020-05-19 VITALS — BP 129/70 | HR 72 | Temp 98.8°F | Ht 64.5 in | Wt 234.0 lb

## 2020-05-19 DIAGNOSIS — D509 Iron deficiency anemia, unspecified: Secondary | ICD-10-CM | POA: Diagnosis not present

## 2020-05-19 DIAGNOSIS — K219 Gastro-esophageal reflux disease without esophagitis: Secondary | ICD-10-CM | POA: Diagnosis not present

## 2020-05-19 NOTE — Progress Notes (Signed)
Annette Frank, M.D. Gastroenterology & Hepatology [facility] [address] [place], [place] [address] Primary Care Physician: Thea Alken [place] [id number]  Referring MD: Francene Finders, NP-C  I will communicate my assessment and recommendations to the referring MD via EMR. Note: Occasional unusual wording and randomly placed punctuation marks may result from the use of speech recognition technology to transcribe this document"  Chief Complaint: Iron deficiency anemia and black stools  History of Present Illness: Annette Frank is a 69 y.o. female with PMH chronic IDA, GERD, asthma, endometrial cancer s/p hysterectomy, hypothyroidism, who presents for evaluation of chronic IDA and dark stools.  Patient was referred to our clinic for evaluation of iron deficiency anemia.  She lives in [place] but would like to establish her care with our clinic.  Based on notes from hematology clinic, the patient was initially noted to have anemia in 2010 for which she underwent an EGD and colonoscopy that showed duodenal ulcers.  The patient had worsening anemia and had a repeat EGD and colonoscopy thousand 15 that was negative for any source of ongoing bleeding.  Finally, these investigations were repeated in this 19 which were normal again. Had capsule endoscopy same year per patient which was normal but no report is available.  Due to this, the patient has been seen by the hematology clinic.  She had intolerance to oral iron due to history of constipation, for which she has been mostly on IV iron infusions. the patient the patient reports that she has been having an infusion every other month since December 2020 which is more frequent than in the past.She received 3 infusions of Feraheme the last month. Her labs have not been checked since then but she is scheduled to have a repeat blood work-up in August. She feels her SOB and  fatigue has improved since then.  Overall, the patient states that she has saw a few episodes of melena when she received Feraheme recently but has not seen any fresh blood in her stool.The patient denies having any nausea, vomiting, fever, chills, hematochezia, hematemesis, abdominal distention, abdominal pain, diarrhea, jaundice, pruritus or weight loss.  I personally reviewed and interpreted the available lab results which showed: 04/04/2020 hemoglobin 9.0 MCV 87.8, ferritin 8.0, iron 20, ferritin saturation 4%, normal folic acid and T34  States she has GERD symptoms once a month for many years but these improve with Pepcid OTC.  Last EGD and colonoscopy in 2019 - normal per the patient, except for diverticulosis.  FHx: neg for any gastrointestinal/liver disease, mother endometrial cancer Social: neg smoking, alcohol or illicit drug use Surgical: hysterectomy  Past Medical History: Past Medical History:  Diagnosis Date  . Anemia   . Arthritis   . Thyroid disease     Past Surgical History: Past Surgical History:  Procedure Laterality Date  . ABDOMINAL HYSTERECTOMY    . LUMBAR LAMINECTOMY N/A 2001  . MEDIAL PARTIAL KNEE REPLACEMENT Bilateral 2009 & 2015  . TOTAL HIP ARTHROPLASTY Right 2014  . UMBILICAL HERNIA REPAIR N/A 1955    Family History: Family History  Problem Relation Age of Onset  . Hypertension Mother   . Cancer Mother        Endometrial cancer  . Hypothyroidism Father   . Arthritis Father   . Hypertension Brother   . Heart attack Maternal Grandfather   . Hypertension Son   . Healthy Son     Social History: Social History  Tobacco Use  Smoking Status Never Smoker  Smokeless Tobacco Never Used   Social History   Substance and Sexual Activity  Alcohol Use Never   Social History   Substance and Sexual Activity  Drug Use Not Currently    Allergies: Allergies  Allergen Reactions  . Cefuroxime Axetil Nausea And Vomiting  . Skelaxin  [Metaxalone] Nausea And Vomiting  . Codeine Palpitations    Medications: Current Outpatient Medications  Medication Sig Dispense Refill  . acetaminophen (TYLENOL) 500 MG tablet Take 500 mg by mouth every 6 (six) hours as needed. As needed    . albuterol (VENTOLIN HFA) 108 (90 Base) MCG/ACT inhaler     . atenolol (TENORMIN) 25 MG tablet Take by mouth daily.    Marland Kitchen azelastine (ASTELIN) 0.1 % nasal spray Place 1 spray into both nostrils 2 (two) times daily.    Marland Kitchen ibuprofen (ADVIL) 400 MG tablet Take 400 mg by mouth every 6 (six) hours as needed for mild pain. As needed    . levothyroxine (SYNTHROID) 100 MCG tablet Take 100 mcg by mouth daily before breakfast.    . Omeprazole (PRILOSEC PO) Take by mouth. 1 once a day as needed after iron  infusion    . Vitamin D, Ergocalciferol, (DRISDOL) 1.25 MG (50000 UT) CAPS capsule Take 50,000 Units by mouth once a week.    . cephALEXin (KEFLEX) 500 MG capsule Take 500 mg by mouth 4 (four) times daily.  (Patient not taking: Reported on 05/19/2020)    . fluticasone (FLONASE) 50 MCG/ACT nasal spray Place 1 spray into both nostrils 2 (two) times daily. (Patient not taking: Reported on 05/19/2020)     No current facility-administered medications for this visit.    Review of Systems: GENERAL: negative for malaise, night sweats HEENT: No changes in hearing or vision, no nose bleeds or other nasal problems. NECK: Negative for lumps, goiter, pain and significant neck swelling RESPIRATORY: Negative for cough, wheezing CARDIOVASCULAR: Negative for chest pain, leg swelling, palpitations, orthopnea GI: SEE HPI MUSCULOSKELETAL: Negative for joint pain or swelling, back pain, and muscle pain. SKIN: Negative for lesions, rash PSYCH: Negative for sleep disturbance, mood disorder and recent psychosocial stressors. HEMATOLOGY Negative for prolonged bleeding, bruising easily, and swollen nodes. ENDOCRINE: Negative for cold or heat intolerance, polyuria, polydipsia and  goiter. NEURO: negative for tremor, gait imbalance, syncope and seizures. The remainder of the review of systems is noncontributory.   Physical Exam: BP (!) 129/70 (BP Location: Right Arm, Patient Position: Sitting, Cuff Size: Large)   Pulse 72   Temp 98.8 F (37.1 C) (Oral)   Ht 5' 4.5" (1.638 m)   Wt (!) 234 lb (106.1 kg)   BMI 39.55 kg/m  GENERAL: The patient is AO x3, in no acute distress.  Obese HEENT: Head is normocephalic and atraumatic. EOMI are intact. Mouth is well hydrated and without lesions. NECK: Supple. No masses LUNGS: Clear to auscultation. No presence of rhonchi/wheezing/rales. Adequate chest expansion HEART: RRR, normal s1 and s2. ABDOMEN: Soft, nontender, no guarding, no peritoneal signs, and nondistended. BS +. No masses. EXTREMITIES: Without any cyanosis, clubbing, rash, lesions or edema. NEUROLOGIC: AOx3, no focal motor deficit. SKIN: no jaundice, no rashes   Imaging/Labs: as above  I personally reviewed and interpreted the available labs, imaging and endoscopic files.  Impression and Plan: Odetta Forness is a 69 y.o. female with PMH chronic IDA, GERD, asthma, endometrial cancer s/p hysterectomy, hypothyroidism, who presents for evaluation of chronic IDA and melena.  The patient has had  a chronic history of iron deficiency anemia which has been managed with IV iron through the years but most recently has required more frequent infusions with partial response.  It is unclear if the patient has indeed presented episodes of melena but these have been intermittent.  At this point, she should continue with her IV iron as scheduled by hematology but I consider a repeat endoscopy investigation with an EGD and colonoscopy are warranted at this moment.  I requested the patient to obtain a CD with the most recent capsule endoscopy imaging so I can review them.  In terms of her GERD, he has been adequately controlled with Pepcid as needed, which she can continue.  The  patient agreed and understood.  -Schedule EGD with small bowel biopsies and colonoscopy -Please obtain original CD with recording of capsule endoscopy -Continue iron infusion as scheduled  All questions were answered.      Harvel Quale, MD Gastroenterology and Hepatology [facility]

## 2020-05-19 NOTE — Patient Instructions (Addendum)
Schedule EGD and colonoscopy Please obtain original CD with recording of capsule endoscopy Continue iron infusion as scheduled

## 2020-05-23 ENCOUNTER — Other Ambulatory Visit: Payer: Self-pay

## 2020-05-23 ENCOUNTER — Encounter (HOSPITAL_COMMUNITY)
Admission: RE | Admit: 2020-05-23 | Discharge: 2020-05-23 | Disposition: A | Discharge status: Home / Self Care | Payer: Medicare Other | Source: Ambulatory Visit | Attending: Gastroenterology | Admitting: Gastroenterology

## 2020-05-23 ENCOUNTER — Other Ambulatory Visit (HOSPITAL_COMMUNITY)
Admission: RE | Admit: 2020-05-23 | Discharge: 2020-05-23 | Disposition: A | Discharge status: Home / Self Care | Payer: Medicare Other | Source: Ambulatory Visit | Attending: Internal Medicine | Admitting: Internal Medicine

## 2020-05-23 ENCOUNTER — Encounter (HOSPITAL_COMMUNITY): Payer: Self-pay

## 2020-05-23 DIAGNOSIS — Z01812 Encounter for preprocedural laboratory examination: Secondary | ICD-10-CM | POA: Insufficient documentation

## 2020-05-23 DIAGNOSIS — Z20822 Contact with and (suspected) exposure to covid-19: Secondary | ICD-10-CM | POA: Diagnosis not present

## 2020-05-23 NOTE — Patient Instructions (Signed)
Annette Frank  05/23/2020     @PREFPERIOPPHARMACY @   Your procedure is scheduled on  05/25/2020.  Report to [facility] at  [place].  Call this number if you have problems the morning of surgery:  [phone]   Remember:  Follow the diet and prep instructions given to you by Dr Colman Cater office.                     Take these medicines the morning of surgery with A SIP OF WATER  Atenolol, levothyroxine. Use your inhaler before you come.    Do not wear jewelry, make-up or nail polish.  Do not wear lotions, powders, or perfumes, or deodorant.  Do not shave 48 hours prior to surgery.  Men may shave face and neck.  Do not bring valuables to the hospital.  [facility] is not responsible for any belongings or valuables.  Contacts, dentures or bridgework may not be worn into surgery.  Leave your suitcase in the car.  After surgery it may be brought to your room.  For patients admitted to the hospital, discharge time will be determined by your treatment team.  Patients discharged the day of surgery will not be allowed to drive home.   Name and phone number of your driver:   family Special instructions:  DO NOT smoke the morning of your procedure.  Please read over the following fact sheets that you were given. Anesthesia Post-op Instructions and Care and Recovery After Surgery       Upper Endoscopy, Adult, Care After This sheet gives you information about how to care for yourself after your procedure. Your health care provider may also give you more specific instructions. If you have problems or questions, contact your health care provider. What can I expect after the procedure? After the procedure, it is common to have:  A sore throat.  Mild stomach pain or discomfort.  Bloating.  Nausea. Follow these instructions at home:   Follow instructions from your health care provider about what to eat or drink after your procedure.  Return to your normal  activities as told by your health care provider. Ask your health care provider what activities are safe for you.  Take over-the-counter and prescription medicines only as told by your health care provider.  Do not drive for 24 hours if you were given a sedative during your procedure.  Keep all follow-up visits as told by your health care provider. This is important. Contact a health care provider if you have:  A sore throat that lasts longer than one day.  Trouble swallowing. Get help right away if:  You vomit blood or your vomit looks like coffee grounds.  You have: ? A fever. ? Bloody, black, or tarry stools. ? A severe sore throat or you cannot swallow. ? Difficulty breathing. ? Severe pain in your chest or abdomen. Summary  After the procedure, it is common to have a sore throat, mild stomach discomfort, bloating, and nausea.  Do not drive for 24 hours if you were given a sedative during the procedure.  Follow instructions from your health care provider about what to eat or drink after your procedure.  Return to your normal activities as told by your health care provider. This information is not intended to replace advice given to you by your health care provider. Make sure you discuss any questions you have with your health care provider. Document Revised:  04/08/2018 Document Reviewed: 03/17/2018 Elsevier Patient Education  [place].  Colonoscopy, Adult, Care After This sheet gives you information about how to care for yourself after your procedure. Your health care provider may also give you more specific instructions. If you have problems or questions, contact your health care provider. What can I expect after the procedure? After the procedure, it is common to have:  A small amount of blood in your stool for 24 hours after the procedure.  Some gas.  Mild cramping or bloating of your abdomen. Follow these instructions at home: Eating and  drinking   Drink enough fluid to keep your urine pale yellow.  Follow instructions from your health care provider about eating or drinking restrictions.  Resume your normal diet as instructed by your health care provider. Avoid heavy or fried foods that are hard to digest. Activity  Rest as told by your health care provider.  Avoid sitting for a long time without moving. Get up to take short walks every 1-2 hours. This is important to improve blood flow and breathing. Ask for help if you feel weak or unsteady.  Return to your normal activities as told by your health care provider. Ask your health care provider what activities are safe for you. Managing cramping and bloating   Try walking around when you have cramps or feel bloated.  Apply heat to your abdomen as told by your health care provider. Use the heat source that your health care provider recommends, such as a moist heat pack or a heating pad. ? Place a towel between your skin and the heat source. ? Leave the heat on for 20-30 minutes. ? Remove the heat if your skin turns bright red. This is especially important if you are unable to feel pain, heat, or cold. You may have a greater risk of getting burned. General instructions  For the first 24 hours after the procedure: ? Do not drive or use machinery. ? Do not sign important documents. ? Do not drink alcohol. ? Do your regular daily activities at a slower pace than normal. ? Eat soft foods that are easy to digest.  Take over-the-counter and prescription medicines only as told by your health care provider.  Keep all follow-up visits as told by your health care provider. This is important. Contact a health care provider if:  You have blood in your stool 2-3 days after the procedure. Get help right away if you have:  More than a small spotting of blood in your stool.  Large blood clots in your stool.  Swelling of your abdomen.  Nausea or vomiting.  A  fever.  Increasing pain in your abdomen that is not relieved with medicine. Summary  After the procedure, it is common to have a small amount of blood in your stool. You may also have mild cramping and bloating of your abdomen.  For the first 24 hours after the procedure, do not drive or use machinery, sign important documents, or drink alcohol.  Get help right away if you have a lot of blood in your stool, nausea or vomiting, a fever, or increased pain in your abdomen. This information is not intended to replace advice given to you by your health care provider. Make sure you discuss any questions you have with your health care provider. Document Revised: 05/11/2019 Document Reviewed: 05/11/2019 Elsevier Patient Education  [place] After These instructions provide you with information about caring for yourself after  your procedure. Your health care provider may also give you more specific instructions. Your treatment has been planned according to current medical practices, but problems sometimes occur. Call your health care provider if you have any problems or questions after your procedure. What can I expect after the procedure? After your procedure, you may:  Feel sleepy for several hours.  Feel clumsy and have poor balance for several hours.  Feel forgetful about what happened after the procedure.  Have poor judgment for several hours.  Feel nauseous or vomit.  Have a sore throat if you had a breathing tube during the procedure. Follow these instructions at home: For at least 24 hours after the procedure:      Have a responsible adult stay with you. It is important to have someone help care for you until you are awake and alert.  Rest as needed.  Do not: ? Participate in activities in which you could fall or become injured. ? Drive. ? Use heavy machinery. ? Drink alcohol. ? Take sleeping pills or medicines that cause  drowsiness. ? Make important decisions or sign legal documents. ? Take care of children on your own. Eating and drinking  Follow the diet that is recommended by your health care provider.  If you vomit, drink water, juice, or soup when you can drink without vomiting.  Make sure you have little or no nausea before eating solid foods. General instructions  Take over-the-counter and prescription medicines only as told by your health care provider.  If you have sleep apnea, surgery and certain medicines can increase your risk for breathing problems. Follow instructions from your health care provider about wearing your sleep device: ? Anytime you are sleeping, including during daytime naps. ? While taking prescription pain medicines, sleeping medicines, or medicines that make you drowsy.  If you smoke, do not smoke without supervision.  Keep all follow-up visits as told by your health care provider. This is important. Contact a health care provider if:  You keep feeling nauseous or you keep vomiting.  You feel light-headed.  You develop a rash.  You have a fever. Get help right away if:  You have trouble breathing. Summary  For several hours after your procedure, you may feel sleepy and have poor judgment.  Have a responsible adult stay with you for at least 24 hours or until you are awake and alert. This information is not intended to replace advice given to you by your health care provider. Make sure you discuss any questions you have with your health care provider. Document Revised: 01/13/2018 Document Reviewed: 02/05/2016 Elsevier Patient Education  [place].

## 2020-05-24 LAB — SARS CORONAVIRUS 2 (TAT 6-24 HRS): SARS Coronavirus 2: NEGATIVE

## 2020-05-25 ENCOUNTER — Encounter (HOSPITAL_COMMUNITY)
Admission: RE | Disposition: A | Discharge status: Home / Self Care | Payer: Self-pay | Source: Home / Self Care | Attending: Gastroenterology

## 2020-05-25 ENCOUNTER — Ambulatory Visit (HOSPITAL_COMMUNITY): Payer: Medicare Other | Admitting: Anesthesiology

## 2020-05-25 ENCOUNTER — Ambulatory Visit (HOSPITAL_COMMUNITY)
Admission: RE | Admit: 2020-05-25 | Discharge: 2020-05-25 | Disposition: A | Discharge status: Home / Self Care | Payer: Medicare Other | Attending: Gastroenterology | Admitting: Gastroenterology

## 2020-05-25 ENCOUNTER — Encounter (HOSPITAL_COMMUNITY): Payer: Self-pay | Admitting: Gastroenterology

## 2020-05-25 ENCOUNTER — Other Ambulatory Visit (INDEPENDENT_AMBULATORY_CARE_PROVIDER_SITE_OTHER): Payer: Self-pay | Admitting: Gastroenterology

## 2020-05-25 ENCOUNTER — Other Ambulatory Visit: Payer: Self-pay

## 2020-05-25 DIAGNOSIS — R1310 Dysphagia, unspecified: Secondary | ICD-10-CM

## 2020-05-25 DIAGNOSIS — K449 Diaphragmatic hernia without obstruction or gangrene: Secondary | ICD-10-CM | POA: Diagnosis not present

## 2020-05-25 DIAGNOSIS — K5730 Diverticulosis of large intestine without perforation or abscess without bleeding: Secondary | ICD-10-CM | POA: Insufficient documentation

## 2020-05-25 DIAGNOSIS — M1990 Unspecified osteoarthritis, unspecified site: Secondary | ICD-10-CM | POA: Diagnosis not present

## 2020-05-25 DIAGNOSIS — Z96641 Presence of right artificial hip joint: Secondary | ICD-10-CM | POA: Insufficient documentation

## 2020-05-25 DIAGNOSIS — Z79899 Other long term (current) drug therapy: Secondary | ICD-10-CM | POA: Diagnosis not present

## 2020-05-25 DIAGNOSIS — D509 Iron deficiency anemia, unspecified: Secondary | ICD-10-CM | POA: Insufficient documentation

## 2020-05-25 DIAGNOSIS — E039 Hypothyroidism, unspecified: Secondary | ICD-10-CM | POA: Diagnosis not present

## 2020-05-25 DIAGNOSIS — Z79890 Hormone replacement therapy (postmenopausal): Secondary | ICD-10-CM | POA: Insufficient documentation

## 2020-05-25 DIAGNOSIS — K319 Disease of stomach and duodenum, unspecified: Secondary | ICD-10-CM | POA: Diagnosis not present

## 2020-05-25 DIAGNOSIS — I10 Essential (primary) hypertension: Secondary | ICD-10-CM | POA: Diagnosis not present

## 2020-05-25 DIAGNOSIS — D123 Benign neoplasm of transverse colon: Secondary | ICD-10-CM | POA: Diagnosis not present

## 2020-05-25 DIAGNOSIS — Z96653 Presence of artificial knee joint, bilateral: Secondary | ICD-10-CM | POA: Insufficient documentation

## 2020-05-25 DIAGNOSIS — K3189 Other diseases of stomach and duodenum: Secondary | ICD-10-CM | POA: Diagnosis not present

## 2020-05-25 DIAGNOSIS — K635 Polyp of colon: Secondary | ICD-10-CM | POA: Diagnosis not present

## 2020-05-25 DIAGNOSIS — D124 Benign neoplasm of descending colon: Secondary | ICD-10-CM | POA: Diagnosis not present

## 2020-05-25 HISTORY — PX: BIOPSY: SHX5522

## 2020-05-25 HISTORY — PX: COLONOSCOPY WITH PROPOFOL: SHX5780

## 2020-05-25 HISTORY — PX: ESOPHAGOGASTRODUODENOSCOPY (EGD) WITH PROPOFOL: SHX5813

## 2020-05-25 HISTORY — PX: POLYPECTOMY: SHX5525

## 2020-05-25 SURGERY — COLONOSCOPY WITH PROPOFOL
Anesthesia: General

## 2020-05-25 MED ORDER — PROPOFOL 10 MG/ML IV BOLUS
INTRAVENOUS | Status: AC
  Filled 2020-05-25: qty 20

## 2020-05-25 MED ORDER — PROPOFOL 10 MG/ML IV BOLUS
INTRAVENOUS | Status: DC | PRN
  Administered 2020-05-25 (×2): 50 mg via INTRAVENOUS
  Administered 2020-05-25: 100 mg via INTRAVENOUS
  Administered 2020-05-25 (×3): 50 mg via INTRAVENOUS

## 2020-05-25 MED ORDER — PROPOFOL 10 MG/ML IV BOLUS
INTRAVENOUS | Status: AC
  Filled 2020-05-25: qty 40

## 2020-05-25 MED ORDER — GLYCOPYRROLATE 0.2 MG/ML IJ SOLN: 0.2000 mg | Freq: Once | INTRAMUSCULAR | Status: DC

## 2020-05-25 MED ORDER — PROPOFOL 500 MG/50ML IV EMUL
INTRAVENOUS | Status: DC | PRN
  Administered 2020-05-25: 150 ug/kg/min via INTRAVENOUS

## 2020-05-25 MED ORDER — LIDOCAINE VISCOUS HCL 2 % MT SOLN
OROMUCOSAL | Status: AC
  Filled 2020-05-25: qty 15

## 2020-05-25 MED ORDER — LACTATED RINGERS IV SOLN: Freq: Once | INTRAVENOUS | Status: AC

## 2020-05-25 MED ORDER — LIDOCAINE VISCOUS HCL 2 % MT SOLN
15.0000 mL | Freq: Once | OROMUCOSAL | Status: AC
  Administered 2020-05-25: 15 mL via OROMUCOSAL

## 2020-05-25 NOTE — Op Note (Signed)
[facility] Patient Name: Annette Frank Procedure Date: 05/25/2020 11:23 AM MRN: [id number] Date of Birth: October 23, 1951 Attending MD: [name] ,  CSN: [id number] Age: 69 Admit Type: Outpatient Procedure:                Upper GI endoscopy Indications:              Iron deficiency anemia Providers:                [name], Jeanann Lewandowsky. Sharon Seller, RN, Aram Candela Referring MD:              Medicines:                Monitored Anesthesia Care Complications:            No immediate complications. Estimated Blood Loss:     Estimated blood loss: none. Procedure:                Pre-Anesthesia Assessment:                           - Prior to the procedure, a History and Physical                            was performed, and patient medications, allergies                            and sensitivities were reviewed. The patient's                            tolerance of previous anesthesia was reviewed.                           - The risks and benefits of the procedure and the                            sedation options and risks were discussed with the                            patient. All questions were answered and informed                            consent was obtained.                           - ASA Grade Assessment: II - A patient with mild                            systemic disease.                           After obtaining informed consent, the endoscope was                            passed under direct vision. Throughout the  procedure, the patient's blood pressure, pulse, and                            oxygen saturations were monitored continuously. The                            [id number]-[id number] ([id number]) scope was introduced through the                            mouth, and advanced to the second part of duodenum.                            The upper GI endoscopy was accomplished without                             difficulty. The patient tolerated the procedure                            well. Scope In: 11:33:12 AM Scope Out: 11:45:24 AM Total Procedure Duration: 0 hours 12 minutes 12 seconds  Findings:      Esophagogastric landmarks were identified: the Z-line was found at 37 cm       and the site of hiatal narrowing was found at 42 cm from the incisors.      The examined esophagus was normal.      A 5 cm hiatal hernia was present.      Two linear 5 mm erosions with no stigmata of recent bleeding were found       in the gastric body.These lesions were below the site of hiatal       narrowing and were concerning for possible Cameron's lesions.      Two linear 5 mm erosions with no stigmata of recent bleeding were found       in the gastric antrum. Biopsies from body and antrum were taken with a       cold forceps for Helicobacter pylori testing.      The examined duodenum was normal. Biopsies for histology were taken with       a cold forceps for evaluation of celiac disease. Impression:               - Esophagogastric landmarks identified.                           - Normal esophagus.                           - 5 cm hiatal hernia.                           Lysbeth Galas lesions.                           - Erosive gastropathy with no stigmata of recent                            bleeding. Biopsied.                           -  Normal examined duodenum. Biopsied. Moderate Sedation:      Per Anesthesia Care Recommendation:           - Discharge patient to home (ambulatory).                           - Resume previous diet.                           - Await pathology results.                           - Obtain CT chest w/o IV contrast to delineate                            hiatal hernia. Procedure Code(s):        --- Professional ---                           [phone], GC, Esophagogastroduodenoscopy, flexible,                            transoral; with biopsy, single or multiple Diagnosis Code(s):         --- Professional ---                           K44.9, Diaphragmatic hernia without obstruction or                            gangrene                           K31.89, Other diseases of stomach and duodenum                           D50.9, Iron deficiency anemia, unspecified CPT copyright 2019 American Medical Association. All rights reserved. The codes documented in this report are preliminary and upon coder review may  be revised to meet current compliance requirements. [name], MD [name],  05/25/2020 12:39:00 PM This report has been signed electronically. Number of Addenda: 0

## 2020-05-25 NOTE — Anesthesia Preprocedure Evaluation (Addendum)
Anesthesia Evaluation  Patient identified by MRN, date of birth, ID band Patient awake    Reviewed: Allergy & Precautions, NPO status , Patient's Chart, lab work & pertinent test results  History of Anesthesia Complications Negative for: history of anesthetic complications  Airway Mallampati: II  TM Distance: >3 FB Neck ROM: Full    Dental  (+) Caps, Dental Advisory Given   Pulmonary neg pulmonary ROS,    Pulmonary exam normal breath sounds clear to auscultation       Cardiovascular Exercise Tolerance: Good hypertension, Pt. on home beta blockers Normal cardiovascular exam Rhythm:Regular Rate:Normal     Neuro/Psych negative neurological ROS  negative psych ROS   GI/Hepatic Neg liver ROS, GERD  Medicated and Controlled,  Endo/Other  Hypothyroidism   Renal/GU negative Renal ROS     Musculoskeletal  (+) Arthritis ,   Abdominal   Peds  Hematology  (+) anemia ,   Anesthesia Other Findings   Reproductive/Obstetrics                            Anesthesia Physical Anesthesia Plan  ASA: II  Anesthesia Plan: General   Post-op Pain Management:    Induction: Intravenous  PONV Risk Score and Plan: 2 and TIVA  Airway Management Planned: Nasal Cannula, Natural Airway and Simple Face Mask  Additional Equipment:   Intra-op Plan:   Post-operative Plan:   Informed Consent: I have reviewed the patients History and Physical, chart, labs and discussed the procedure including the risks, benefits and alternatives for the proposed anesthesia with the patient or authorized representative who has indicated his/her understanding and acceptance.     Dental advisory given  Plan Discussed with: CRNA and Surgeon  Anesthesia Plan Comments:         Anesthesia Quick Evaluation

## 2020-05-25 NOTE — H&P (Signed)
Annette Frank is an 69 y.o. female.   Chief Complaint: IDA HPI: Briefly, this is a 69 year old female with past medical history chronic IDA, GERD, asthma, endometrial cancer s/p hysterectomy, hypothyroidism, who comes into the hospital for endoscopic evaluation of recurrent and progressive iron deficiency anemia.  The patient has had at 11 year history of iron deficiency anemia which initially responded to oral iron but was subsequently switched to IV iron infusion. She has required more frequent infusions recently. She had recent endoscopic investigations (both EGD and colonoscopy) in 2019 that were normal per the patient, she also underwent a capsule endoscopy at this time and she was told it was completely normal. Upon review of her labs, there was presence of normocytic anemia with hemoglobin of 9.0 and decreased ferritin of 8. She denied having any hematochezia, melena, hematemesis, genitourinary bleeding.  Past Medical History:  Diagnosis Date  . Anemia   . Arthritis   . Thyroid disease     Past Surgical History:  Procedure Laterality Date  . ABDOMINAL HYSTERECTOMY    . LUMBAR LAMINECTOMY N/A 2001  . MEDIAL PARTIAL KNEE REPLACEMENT Bilateral 2009 & 2015  . TOTAL HIP ARTHROPLASTY Right 2014  . UMBILICAL HERNIA REPAIR N/A 1955    Family History  Problem Relation Age of Onset  . Hypertension Mother   . Cancer Mother        Endometrial cancer  . Hypothyroidism Father   . Arthritis Father   . Hypertension Brother   . Heart attack Maternal Grandfather   . Hypertension Son   . Healthy Son    Social History:  reports that she has never smoked. She has never used smokeless tobacco. She reports previous drug use. She reports that she does not drink alcohol.  Allergies:  Allergies  Allergen Reactions  . Cefuroxime Axetil Nausea And Vomiting  . Skelaxin [Metaxalone] Nausea And Vomiting  . Codeine Palpitations    Medications Prior to Admission  Medication Sig Dispense Refill   . acetaminophen (TYLENOL) 500 MG tablet Take 1,000 mg by mouth every 6 (six) hours as needed for moderate pain.     Marland Kitchen atenolol (TENORMIN) 25 MG tablet Take 25 mg by mouth daily.     Marland Kitchen azelastine (ASTELIN) 0.1 % nasal spray Place 1 spray into both nostrils daily as needed for allergies.     . fluticasone (FLONASE) 50 MCG/ACT nasal spray Place 1 spray into both nostrils daily as needed for allergies.     Marland Kitchen ibuprofen (ADVIL) 400 MG tablet Take 400 mg by mouth every 6 (six) hours as needed for mild pain or moderate pain.     Marland Kitchen levothyroxine (SYNTHROID) 100 MCG tablet Take 100 mcg by mouth daily before breakfast.    . Vitamin D, Ergocalciferol, (DRISDOL) 1.25 MG (50000 UT) CAPS capsule Take 50,000 Units by mouth once a week.    Marland Kitchen albuterol (VENTOLIN HFA) 108 (90 Base) MCG/ACT inhaler Inhale 1-2 puffs into the lungs every 6 (six) hours as needed for wheezing or shortness of breath.     . cephALEXin (KEFLEX) 500 MG capsule Take 500 mg by mouth 4 (four) times daily.  (Patient not taking: Reported on 05/20/2020)      No results found for this or any previous visit (from the past 48 hour(s)). No results found.  Review of Systems  Constitutional: Positive for fatigue.  HENT: Negative.   Eyes: Negative.   Respiratory: Negative.   Cardiovascular: Negative.   Gastrointestinal: Negative.   Endocrine: Negative.   Genitourinary:  Negative.   Musculoskeletal: Negative.   Allergic/Immunologic: Negative.   Neurological: Negative.   Hematological: Negative.   Psychiatric/Behavioral: Negative.  Confusion: .dcphys.    Blood pressure (!) 143/74, pulse 76, resp. rate 19, height 5\' 6"  (1.676 m), weight (!) 107 kg, SpO2 100 %. Physical Exam   Assessment/Plan GENERAL: The patient is AO x3, in no acute distress. Obese. HEENT: Head is normocephalic and atraumatic. EOMI are intact. Mouth is well hydrated and without lesions. NECK: Supple. No masses LUNGS: Clear to auscultation. No presence of  rhonchi/wheezing/rales. Adequate chest expansion HEART: RRR, normal s1 and s2. ABDOMEN: Soft, nontender, no guarding, no peritoneal signs, and nondistended. BS +. No masses. EXTREMITIES: Without any cyanosis, clubbing, rash, lesions or edema. NEUROLOGIC: AOx3, no focal motor deficit. SKIN: no jaundice, no rashes   Harvel Quale, MD 05/25/2020, 11:24 AM

## 2020-05-25 NOTE — Anesthesia Postprocedure Evaluation (Signed)
Anesthesia Post Note  Patient: Annette Frank  Procedure(s) Performed: COLONOSCOPY WITH PROPOFOL (N/A ) ESOPHAGOGASTRODUODENOSCOPY (EGD) WITH PROPOFOL (N/A ) BIOPSY POLYPECTOMY  Patient location during evaluation: Phase II Anesthesia Type: General Level of consciousness: awake, oriented, awake and alert and patient cooperative Pain management: pain level controlled Vital Signs Assessment: post-procedure vital signs reviewed and stable Respiratory status: spontaneous breathing, respiratory function stable and nonlabored ventilation Cardiovascular status: stable Postop Assessment: no apparent nausea or vomiting Anesthetic complications: no   No complications documented.   Last Vitals:  Vitals:   05/25/20 1012 05/25/20 1225  BP: (!) 143/74 119/65  Pulse: 76 68  Resp: 19 (!) 24  Temp:  36.5 C  SpO2: 100% 96%    Last Pain:  Vitals:   05/25/20 1225  TempSrc: Oral  PainSc: 0-No pain                 Nira Visscher

## 2020-05-25 NOTE — Transfer of Care (Signed)
Immediate Anesthesia Transfer of Care Note  Patient: Annette Frank  Procedure(s) Performed: COLONOSCOPY WITH PROPOFOL (N/A ) ESOPHAGOGASTRODUODENOSCOPY (EGD) WITH PROPOFOL (N/A ) BIOPSY POLYPECTOMY  Patient Location: PACU  Anesthesia Type:General  Level of Consciousness: awake, alert , oriented and patient cooperative  Airway & Oxygen Therapy: Patient Spontanous Breathing  Post-op Assessment: Report given to RN, Post -op Vital signs reviewed and stable and Patient moving all extremities X 4  Post vital signs: Reviewed and stable  Last Vitals:  Vitals Value Taken Time  BP    Temp    Pulse    Resp    SpO2      Last Pain:  Vitals:   05/25/20 1131  TempSrc:   PainSc: 0-No pain      Patients Stated Pain Goal: 7 (78/93/81 0175)  Complications: No complications documented.

## 2020-05-25 NOTE — Op Note (Signed)
[facility] Patient Name: Annette Frank Procedure Date: 05/25/2020 11:18 AM MRN: [id number] Date of Birth: 07/09/1951 Attending MD: [name] ,  CSN: [id number] Age: 69 Admit Type: Outpatient Procedure:                Colonoscopy Indications:              Iron deficiency anemia Providers:                [name], [place] Sharon Seller, RN, Aram Candela Referring MD:              Medicines:                Monitored Anesthesia Care Complications:            No immediate complications. Estimated Blood Loss:     Estimated blood loss: none. Procedure:                Pre-Anesthesia Assessment:                           - Prior to the procedure, a History and Physical                            was performed, and patient medications, allergies                            and sensitivities were reviewed. The patient's                            tolerance of previous anesthesia was reviewed.                           - The risks and benefits of the procedure and the                            sedation options and risks were discussed with the                            patient. All questions were answered and informed                            consent was obtained.                           - ASA Grade Assessment: II - A patient with mild                            systemic disease.                           After obtaining informed consent, the colonoscope                            was passed under direct vision. Throughout the  procedure, the patient's blood pressure, pulse, and                            oxygen saturations were monitored continuously. The                            [id number]-[id number] ([id number]) scope was introduced through                            the anus and advanced to the the terminal ileum.                            The colonoscopy was performed without difficulty.                            The  patient tolerated the procedure well. The                            quality of the bowel preparation was good. Scope                            withdrawal time was 12 minutes. Scope In: 11:49:21 AM Scope Out: 12:20:40 PM Scope Withdrawal Time: 0 hours 13 minutes 54 seconds  Total Procedure Duration: 0 hours 31 minutes 19 seconds  Findings:      The perianal and digital rectal examinations were normal.      The terminal ileum appeared normal.      A 4 mm polyp was found in the transverse colon. The polyp was sessile.       The polyp was removed with a cold snare. Resection and retrieval were       complete.      A few small-mouthed diverticula were found in the sigmoid colon.      A 2 mm polyp was found in the descending colon. The polyp was sessile.       The polyp was removed with a cold biopsy forceps. Resection and       retrieval were complete.      The retroflexed view of the distal rectum and anal verge was normal and       showed no anal or rectal abnormalities. Impression:               - The examined portion of the ileum was normal.                           - One 4 mm polyp in the transverse colon, removed                            with a cold snare. Resected and retrieved.                           - Diverticulosis in the sigmoid colon.                           - One 2 mm polyp in the descending colon, removed  with a cold biopsy forceps. Resected and retrieved.                           - The distal rectum and anal verge are normal on                            retroflexion view. Moderate Sedation:      Per Anesthesia Care Recommendation:           - Discharge patient to home (ambulatory).                           - High fiber diet.                           - Await pathology results.                           - Repeat colonoscopy for surveillance based on                            pathology results. Procedure Code(s):        ---  Professional ---                           [id number], GC, Colonoscopy, flexible; with removal of                            tumor(s), polyp(s), or other lesion(s) by snare                            technique                           38182, 25, Colonoscopy, flexible; with biopsy,                            single or multiple Diagnosis Code(s):        --- Professional ---                           K63.5, Polyp of colon                           D50.9, Iron deficiency anemia, unspecified                           K57.30, Diverticulosis of large intestine without                            perforation or abscess without bleeding CPT copyright 2019 American Medical Association. All rights reserved. The codes documented in this report are preliminary and upon coder review may  be revised to meet current compliance requirements. [name], MD [name],  05/25/2020 12:44:15 PM This report has been signed electronically. Number of Addenda: 0

## 2020-05-25 NOTE — Discharge Instructions (Signed)
Resume your previous diet.  We are waiting for your pathology results.  Obtain CT chest w/o IV contrast to delineate hiatal hernia. Repeat colonoscopy based on pathology findings        Upper Endoscopy, Adult, Care After This sheet gives you information about how to care for yourself after your procedure. Your health care provider may also give you more specific instructions. If you have problems or questions, contact your health care provider. What can I expect after the procedure? After the procedure, it is common to have:  A sore throat.  Mild stomach pain or discomfort.  Bloating.  Nausea. Follow these instructions at home:   Follow instructions from your health care provider about what to eat or drink after your procedure.  Return to your normal activities as told by your health care provider. Ask your health care provider what activities are safe for you.  Take over-the-counter and prescription medicines only as told by your health care provider.  Do not drive for 24 hours if you were given a sedative during your procedure.  Keep all follow-up visits as told by your health care provider. This is important. Contact a health care provider if you have:  A sore throat that lasts longer than one day.  Trouble swallowing. Get help right away if:  You vomit blood or your vomit looks like coffee grounds.  You have: ? A fever. ? Bloody, black, or tarry stools. ? A severe sore throat or you cannot swallow. ? Difficulty breathing. ? Severe pain in your chest or abdomen. Summary  After the procedure, it is common to have a sore throat, mild stomach discomfort, bloating, and nausea.  Do not drive for 24 hours if you were given a sedative during the procedure.  Follow instructions from your health care provider about what to eat or drink after your procedure.  Return to your normal activities as told by your health care provider. This information is not intended to  replace advice given to you by your health care provider. Make sure you discuss any questions you have with your health care provider. Document Revised: 04/08/2018 Document Reviewed: 03/17/2018 Elsevier Patient Education  [place].     Colonoscopy, Adult, Care After This sheet gives you information about how to care for yourself after your procedure. Your doctor may also give you more specific instructions. If you have problems or questions, call your doctor. What can I expect after the procedure? After the procedure, it is common to have:  A small amount of blood in your poop (stool) for 24 hours.  Some gas.  Mild cramping or bloating in your belly (abdomen). Follow these instructions at home: Eating and drinking   Drink enough fluid to keep your pee (urine) pale yellow.  Follow instructions from your doctor about what you cannot eat or drink.  Return to your normal diet as told by your doctor. Avoid heavy or fried foods that are hard to digest. Activity  Rest as told by your doctor.  Do not sit for a long time without moving. Get up to take short walks every 1-2 hours. This is important. Ask for help if you feel weak or unsteady.  Return to your normal activities as told by your doctor. Ask your doctor what activities are safe for you. To help cramping and bloating:   Try walking around.  Put heat on your belly as told by your doctor. Use the heat source that your doctor recommends, such as a  moist heat pack or a heating pad. ? Put a towel between your skin and the heat source. ? Leave the heat on for 20-30 minutes. ? Remove the heat if your skin turns bright red. This is very important if you are unable to feel pain, heat, or cold. You may have a greater risk of getting burned. General instructions  For the first 24 hours after the procedure: ? Do not drive or use machinery. ? Do not sign important documents. ? Do not drink alcohol. ? Do your daily  activities more slowly than normal. ? Eat foods that are soft and easy to digest.  Take over-the-counter or prescription medicines only as told by your doctor.  Keep all follow-up visits as told by your doctor. This is important. Contact a doctor if:  You have blood in your poop 2-3 days after the procedure. Get help right away if:  You have more than a small amount of blood in your poop.  You see large clumps of tissue (blood clots) in your poop.  Your belly is swollen.  You feel like you may vomit (nauseous).  You vomit.  You have a fever.  You have belly pain that gets worse, and medicine does not help your pain. Summary  After the procedure, it is common to have a small amount of blood in your poop. You may also have mild cramping and bloating in your belly.  For the first 24 hours after the procedure, do not drive or use machinery, do not sign important documents, and do not drink alcohol.  Get help right away if you have a lot of blood in your poop, feel like you may vomit, have a fever, or have more belly pain. This information is not intended to replace advice given to you by your health care provider. Make sure you discuss any questions you have with your health care provider. Document Revised: 05/11/2019 Document Reviewed: 05/11/2019 Elsevier Patient Education  [place] After These instructions provide you with information about caring for yourself after your procedure. Your health care provider may also give you more specific instructions. Your treatment has been planned according to current medical practices, but problems sometimes occur. Call your health care provider if you have any problems or questions after your procedure. What can I expect after the procedure? After your procedure, you may:  Feel sleepy for several hours.  Feel clumsy and have poor balance for several hours.  Feel forgetful about what  happened after the procedure.  Have poor judgment for several hours.  Feel nauseous or vomit.  Have a sore throat if you had a breathing tube during the procedure. Follow these instructions at home: For at least 24 hours after the procedure:      Have a responsible adult stay with you. It is important to have someone help care for you until you are awake and alert.  Rest as needed.  Do not: ? Participate in activities in which you could fall or become injured. ? Drive. ? Use heavy machinery. ? Drink alcohol. ? Take sleeping pills or medicines that cause drowsiness. ? Make important decisions or sign legal documents. ? Take care of children on your own. Eating and drinking  Follow the diet that is recommended by your health care provider.  If you vomit, drink water, juice, or soup when you can drink without vomiting.  Make sure you have little or no nausea before eating  solid foods. General instructions  Take over-the-counter and prescription medicines only as told by your health care provider.  If you have sleep apnea, surgery and certain medicines can increase your risk for breathing problems. Follow instructions from your health care provider about wearing your sleep device: ? Anytime you are sleeping, including during daytime naps. ? While taking prescription pain medicines, sleeping medicines, or medicines that make you drowsy.  If you smoke, do not smoke without supervision.  Keep all follow-up visits as told by your health care provider. This is important. Contact a health care provider if:  You keep feeling nauseous or you keep vomiting.  You feel light-headed.  You develop a rash.  You have a fever. Get help right away if:  You have trouble breathing. Summary  For several hours after your procedure, you may feel sleepy and have poor judgment.  Have a responsible adult stay with you for at least 24 hours or until you are awake and alert. This  information is not intended to replace advice given to you by your health care provider. Make sure you discuss any questions you have with your health care provider. Document Revised: 01/13/2018 Document Reviewed: 02/05/2016 Elsevier Patient Education  [place].

## 2020-05-26 ENCOUNTER — Other Ambulatory Visit: Payer: Self-pay

## 2020-05-26 LAB — SURGICAL PATHOLOGY

## 2020-05-27 ENCOUNTER — Encounter (HOSPITAL_COMMUNITY): Payer: Self-pay | Admitting: Gastroenterology

## 2020-06-07 ENCOUNTER — Other Ambulatory Visit: Payer: Self-pay

## 2020-06-07 ENCOUNTER — Ambulatory Visit (HOSPITAL_COMMUNITY)
Admission: RE | Admit: 2020-06-07 | Discharge: 2020-06-07 | Disposition: A | Discharge status: Home / Self Care | Payer: Medicare Other | Source: Ambulatory Visit | Attending: Gastroenterology | Admitting: Gastroenterology

## 2020-06-07 DIAGNOSIS — R1310 Dysphagia, unspecified: Secondary | ICD-10-CM | POA: Diagnosis present

## 2020-06-07 DIAGNOSIS — K449 Diaphragmatic hernia without obstruction or gangrene: Secondary | ICD-10-CM | POA: Diagnosis present

## 2020-06-08 ENCOUNTER — Telehealth (INDEPENDENT_AMBULATORY_CARE_PROVIDER_SITE_OTHER): Payer: Self-pay | Admitting: Gastroenterology

## 2020-06-08 NOTE — Telephone Encounter (Signed)
I called the patient today to inform her about the results of that CT scan which showed a large hiatal hernia with at least 50% of her stomach herniated into her chest.  Due to this, she will need to be evaluated by surgery to undergo repair of hernia.  The patient understood and agreed.  Lelon Frohlich, can you please send a referral to [facility] surgery for hiatal hernia repair?  Can be Dr. Doyce Loose or Johnathan Hausen.  Thanks,  [name], MD Gastroenterology and Hepatology [facility]

## 2020-06-09 ENCOUNTER — Other Ambulatory Visit: Payer: Self-pay

## 2020-06-09 ENCOUNTER — Inpatient Hospital Stay (HOSPITAL_COMMUNITY): Payer: Medicare Other | Attending: Hematology

## 2020-06-09 DIAGNOSIS — Z8349 Family history of other endocrine, nutritional and metabolic diseases: Secondary | ICD-10-CM | POA: Insufficient documentation

## 2020-06-09 DIAGNOSIS — Z8261 Family history of arthritis: Secondary | ICD-10-CM | POA: Insufficient documentation

## 2020-06-09 DIAGNOSIS — D509 Iron deficiency anemia, unspecified: Secondary | ICD-10-CM | POA: Insufficient documentation

## 2020-06-09 DIAGNOSIS — E079 Disorder of thyroid, unspecified: Secondary | ICD-10-CM | POA: Diagnosis not present

## 2020-06-09 DIAGNOSIS — Z8249 Family history of ischemic heart disease and other diseases of the circulatory system: Secondary | ICD-10-CM | POA: Insufficient documentation

## 2020-06-09 DIAGNOSIS — Z79899 Other long term (current) drug therapy: Secondary | ICD-10-CM | POA: Diagnosis not present

## 2020-06-09 DIAGNOSIS — Z8049 Family history of malignant neoplasm of other genital organs: Secondary | ICD-10-CM | POA: Insufficient documentation

## 2020-06-09 LAB — CBC WITH DIFFERENTIAL/PLATELET
Abs Immature Granulocytes: 0.03 10*3/uL (ref 0.00–0.07)
Basophils Absolute: 0.1 10*3/uL (ref 0.0–0.1)
Basophils Relative: 1 %
Eosinophils Absolute: 0.3 10*3/uL (ref 0.0–0.5)
Eosinophils Relative: 5 %
HCT: 41.2 % (ref 36.0–46.0)
Hemoglobin: 12.8 g/dL (ref 12.0–15.0)
Immature Granulocytes: 0 %
Lymphocytes Relative: 16 %
Lymphs Abs: 1.1 10*3/uL (ref 0.7–4.0)
MCH: 28.1 pg (ref 26.0–34.0)
MCHC: 31.1 g/dL (ref 30.0–36.0)
MCV: 90.4 fL (ref 80.0–100.0)
Monocytes Absolute: 0.7 10*3/uL (ref 0.1–1.0)
Monocytes Relative: 10 %
Neutro Abs: 4.6 10*3/uL (ref 1.7–7.7)
Neutrophils Relative %: 68 %
Platelets: 255 10*3/uL (ref 150–400)
RBC: 4.56 MIL/uL (ref 3.87–5.11)
RDW: 19.5 % — ABNORMAL HIGH (ref 11.5–15.5)
WBC: 6.8 10*3/uL (ref 4.0–10.5)
nRBC: 0 % (ref 0.0–0.2)

## 2020-06-09 LAB — FOLATE: Folate: 11 ng/mL (ref >5.9)

## 2020-06-09 LAB — COMPREHENSIVE METABOLIC PANEL
ALT: 24 U/L (ref 0–44)
AST: 19 U/L (ref 15–41)
Albumin: 4.3 g/dL (ref 3.5–5.0)
Alkaline Phosphatase: 89 U/L (ref 38–126)
Anion gap: 10 (ref 5–15)
BUN: 10 mg/dL (ref 8–23)
CO2: 27 mmol/L (ref 22–32)
Calcium: 9.9 mg/dL (ref 8.9–10.3)
Chloride: 103 mmol/L (ref 98–111)
Creatinine, Ser: 0.74 mg/dL (ref 0.44–1.00)
GFR calc Af Amer: >60 mL/min (ref >60)
GFR calc non Af Amer: >60 mL/min (ref >60)
Glucose, Bld: 108 mg/dL — ABNORMAL HIGH (ref 70–99)
Potassium: 4.3 mmol/L (ref 3.5–5.1)
Sodium: 140 mmol/L (ref 135–145)
Total Bilirubin: 0.6 mg/dL (ref 0.3–1.2)
Total Protein: 7 g/dL (ref 6.5–8.1)

## 2020-06-09 LAB — IRON AND TIBC
Iron: 49 ug/dL (ref 28–170)
Saturation Ratios: 12 % (ref 10.4–31.8)
TIBC: 426 ug/dL (ref 250–450)
UIBC: 377 ug/dL

## 2020-06-09 LAB — FERRITIN: Ferritin: 41 ng/mL (ref 11–307)

## 2020-06-09 LAB — VITAMIN B12: Vitamin B-12: 252 pg/mL (ref 180–914)

## 2020-06-09 LAB — VITAMIN D 25 HYDROXY (VIT D DEFICIENCY, FRACTURES): Vit D, 25-Hydroxy: 41.08 ng/mL (ref 30–100)

## 2020-06-09 LAB — LACTATE DEHYDROGENASE: LDH: 133 U/L (ref 98–192)

## 2020-06-09 NOTE — Telephone Encounter (Signed)
Referral placed - they will contact patient to schedule

## 2020-06-10 LAB — PROTEIN ELECTROPHORESIS, SERUM
A/G Ratio: 1.6 (ref 0.7–1.7)
Albumin ELP: 3.9 g/dL (ref 2.9–4.4)
Alpha-1-Globulin: 0.2 g/dL (ref 0.0–0.4)
Alpha-2-Globulin: 0.6 g/dL (ref 0.4–1.0)
Beta Globulin: 1.1 g/dL (ref 0.7–1.3)
Gamma Globulin: 0.6 g/dL (ref 0.4–1.8)
Globulin, Total: 2.5 g/dL (ref 2.2–3.9)
Total Protein ELP: 6.4 g/dL (ref 6.0–8.5)

## 2020-06-16 ENCOUNTER — Encounter (HOSPITAL_COMMUNITY): Payer: Self-pay | Admitting: Nurse Practitioner

## 2020-06-16 ENCOUNTER — Other Ambulatory Visit: Payer: Self-pay

## 2020-06-16 ENCOUNTER — Inpatient Hospital Stay (HOSPITAL_BASED_OUTPATIENT_CLINIC_OR_DEPARTMENT_OTHER): Payer: Medicare Other | Admitting: Nurse Practitioner

## 2020-06-16 DIAGNOSIS — D509 Iron deficiency anemia, unspecified: Secondary | ICD-10-CM | POA: Diagnosis not present

## 2020-06-16 NOTE — Assessment & Plan Note (Addendum)
1.  Iron deficiency anemia: -Originally noted in 2010 worked up by gastroenterology with endoscopy and colonoscopy. -Anemia thought to be secondary to duodenal ulcers and poor absorption. -Anemia continued, repeated endoscopy and colonoscopy in 2015 did not reveal any evidence of bleeding.  Anemia thought to be secondary to poor absorption. -Repeat endoscopy and colonoscopy in 2019 did not reveal any evidence of bleeding. -Bone marrow biopsy completed in September 2020 showed normal marrow.  No evidence of hematologic neoplasm or morphology.  Normal cytogenetics.  No increase in blasts on flow cytometry. -Patient is unable to tolerate p.o. iron supplements due to GI upset.  However she is continuing to take iron tablets on and off. -Patient is unable to tolerate Venofer due to flulike symptoms.  She does not respond to IV Ferrlecit. -Patient has been extensively worked up for her anemia.  It appears anemia is most likely secondary to poor GI absorption. -Work-up labs done on 12/03/2019 showed SPEP negative.  Creatinine 0.83, vitamin B12 221, WBC 6.5, hemoglobin 10.1, platelets 254. -Patient reports she felt better after her Injectafer infusions but is starting to feel fatigued again. -Last Feraheme infusions were on 04/21/2020, 05/17/2020, 05/05/2020 with premeds -We sent her to GI she had her colonoscopy on 05/25/2020 which showed examined portion of the ileum was normal.  A 4 mm polyp in the transverse colon, removed.  Diverticulosis in the sigmoid colon.  Also found a 2 mm polyp in the descending colon, removed.  The distal rectum and anal verge are normal on retroflexion view. -Endoscopy on 05/25/2020 showed normal esophagus.  5 cm hiatal hernia.  Cameron lesions.  Erosive gastropathy with no stigmata of recent bleeding, biopsied.  Normal examined duodenum. -Also showed on exam a large gallstone.  She is scheduled to have surgery to remove her gallstone.  GI suggests that the [facility] lesions were the cause  of her anemia. -Labs done on 06/16/2020 showed hemoglobin 12.8, ferritin 41, percent saturation 12 -We will hold off on iron infusions at this time. -We will follow-up in 2 months with repeat labs.

## 2020-06-16 NOTE — Progress Notes (Signed)
[place], [place] [address]   CLINIC:  Medical Oncology/Hematology  PCP:  Thea Alken [address] [place] [address] [phone]   REASON FOR VISIT: Follow-up for iron deficiency anemia   CURRENT THERAPY: Intermittent iron infusions   INTERVAL HISTORY:  Annette Frank 69 y.o. female returns for routine follow-up for iron deficiency anemia.  Patient reports she recently has been to GI and had her endoscopies.  She denies any bright red bleeding per rectum or melena.  She denies any easy bruising or bleeding.  She denies any abdominal pain. Denies any nausea, vomiting, or diarrhea. Denies any new pains. Had not noticed any recent bleeding such as epistaxis, hematuria or hematochezia. Denies recent chest pain on exertion, shortness of breath on minimal exertion, pre-syncopal episodes, or palpitations. Denies any numbness or tingling in hands or feet. Denies any recent fevers, infections, or recent hospitalizations. Patient reports appetite at 75% and energy level at 25%.     REVIEW OF SYSTEMS:  Review of Systems  Constitutional: Positive for fatigue.  Respiratory: Positive for shortness of breath.   Cardiovascular: Positive for chest pain (Associated with her hiatal hernia and lesions.).  Neurological: Positive for headaches.  All other systems reviewed and are negative.    PAST MEDICAL/SURGICAL HISTORY:  Past Medical History:  Diagnosis Date  . Anemia   . Arthritis   . Thyroid disease    Past Surgical History:  Procedure Laterality Date  . ABDOMINAL HYSTERECTOMY    . BIOPSY  05/25/2020   Procedure: BIOPSY;  Surgeon: Harvel Quale, MD;  [facility];  Service: Gastroenterology;;  duodenum  . COLONOSCOPY WITH PROPOFOL N/A 05/25/2020   Procedure: COLONOSCOPY WITH PROPOFOL;  Surgeon: Harvel Quale, MD;  [facility];  Service: Gastroenterology;  Laterality: N/A;  1045  .  ESOPHAGOGASTRODUODENOSCOPY (EGD) WITH PROPOFOL N/A 05/25/2020   Procedure: ESOPHAGOGASTRODUODENOSCOPY (EGD) WITH PROPOFOL;  Surgeon: Harvel Quale, MD;  [facility];  Service: Gastroenterology;  Laterality: N/A;  . LUMBAR LAMINECTOMY N/A 2001  . MEDIAL PARTIAL KNEE REPLACEMENT Bilateral 2009 & 2015  . POLYPECTOMY  05/25/2020   Procedure: POLYPECTOMY;  Surgeon: Harvel Quale, MD;  [facility];  Service: Gastroenterology;;  colon  . TOTAL HIP ARTHROPLASTY Right 2014  . UMBILICAL HERNIA REPAIR N/A 1955     SOCIAL HISTORY:  Social History   Socioeconomic History  . Marital status: Married    Spouse name: Not on file  . Number of children: 2  . Years of education: Not on file  . Highest education level: Not on file  Occupational History  . Occupation: RETIRED  Tobacco Use  . Smoking status: Never Smoker  . Smokeless tobacco: Never Used  Vaping Use  . Vaping Use: Never used  Substance and Sexual Activity  . Alcohol use: Never  . Drug use: Not Currently  . Sexual activity: Not Currently  Other Topics Concern  . Not on file  Social History Narrative  . Not on file   Social Determinants of Health   Financial Resource Strain: Low Risk   . Difficulty of Paying Living Expenses: Not hard at all  Food Insecurity: No Food Insecurity  . Worried About Charity fundraiser in the Last Year: Never true  . Ran Out of Food in the Last Year: Never true  Transportation Needs: No Transportation Needs  . Lack of Transportation (Medical): No  . Lack of Transportation (Non-Medical):  No  Physical Activity: Inactive  . Days of Exercise per Week: 0 days  . Minutes of Exercise per Session: 0 min  Stress: No Stress Concern Present  . Feeling of Stress : Not at all  Social Connections: Moderately Integrated  . Frequency of Communication with Friends and Family: Three times a week  . Frequency of Social Gatherings with Friends and Family: Never  .  Attends Religious Services: More than 4 times per year  . Active Member of Clubs or Organizations: No  . Attends Archivist Meetings: Never  . Marital Status: Married  Human resources officer Violence: Not At Risk  . Fear of Current or Ex-Partner: No  . Emotionally Abused: No  . Physically Abused: No  . Sexually Abused: No    FAMILY HISTORY:  Family History  Problem Relation Age of Onset  . Hypertension Mother   . Cancer Mother        Endometrial cancer  . Hypothyroidism Father   . Arthritis Father   . Hypertension Brother   . Heart attack Maternal Grandfather   . Hypertension Son   . Healthy Son     CURRENT MEDICATIONS:  Outpatient Encounter Medications as of 06/16/2020  Medication Sig Note  . acetaminophen (TYLENOL) 500 MG tablet Take 1,000 mg by mouth every 6 (six) hours as needed for moderate pain.    Marland Kitchen albuterol (VENTOLIN HFA) 108 (90 Base) MCG/ACT inhaler Inhale 1-2 puffs into the lungs every 6 (six) hours as needed for wheezing or shortness of breath.    Marland Kitchen atenolol (TENORMIN) 25 MG tablet Take 25 mg by mouth daily.    Marland Kitchen azelastine (ASTELIN) 0.1 % nasal spray Place 1 spray into both nostrils daily as needed for allergies.  05/19/2020: As needed   . cephALEXin (KEFLEX) 500 MG capsule Take 500 mg by mouth 4 (four) times daily.  (Patient not taking: Reported on 05/20/2020)   . fluticasone (FLONASE) 50 MCG/ACT nasal spray Place 1 spray into both nostrils daily as needed for allergies.  05/19/2020: As needed   . levothyroxine (SYNTHROID) 100 MCG tablet Take 100 mcg by mouth daily before breakfast.   . Vitamin D, Ergocalciferol, (DRISDOL) 1.25 MG (50000 UT) CAPS capsule Take 50,000 Units by mouth once a week.    No facility-administered encounter medications on file as of 06/16/2020.    ALLERGIES:  Allergies  Allergen Reactions  . Cefuroxime Axetil Nausea And Vomiting  . Skelaxin [Metaxalone] Nausea And Vomiting  . Codeine Palpitations  . Tincture Of Benzoin [Benzoin] Rash      PHYSICAL EXAM:  ECOG Performance status: 1  Vitals:   06/16/20 1413  BP: 128/66  Pulse: 80  Resp: 18  Temp: 97.7 F (36.5 C)  SpO2: 99%   Filed Weights   06/16/20 1413  Weight: 234 lb 11.2 oz (106.5 kg)      Physical Exam Constitutional:      Appearance: Normal appearance. She is normal weight.  Cardiovascular:     Rate and Rhythm: Normal rate and regular rhythm.     Heart sounds: Normal heart sounds.  Pulmonary:     Effort: Pulmonary effort is normal.     Breath sounds: Normal breath sounds.  Abdominal:     General: Bowel sounds are normal.     Palpations: Abdomen is soft.  Musculoskeletal:        General: Normal range of motion.  Skin:    General: Skin is warm.  Neurological:     Mental Status: She is  alert and oriented to person, place, and time. Mental status is at baseline.  Psychiatric:        Mood and Affect: Mood normal.        Behavior: Behavior normal.        Thought Content: Thought content normal.        Judgment: Judgment normal.      LABORATORY DATA:  I have reviewed the labs as listed.  CBC    Component Value Date/Time   WBC 6.8 06/09/2020 1018   RBC 4.56 06/09/2020 1018   HGB 12.8 06/09/2020 1018   HCT 41.2 06/09/2020 1018   PLT 255 06/09/2020 1018   MCV 90.4 06/09/2020 1018   MCH 28.1 06/09/2020 1018   MCHC 31.1 06/09/2020 1018   RDW 19.5 (H) 06/09/2020 1018   LYMPHSABS 1.1 06/09/2020 1018   MONOABS 0.7 06/09/2020 1018   EOSABS 0.3 06/09/2020 1018   BASOSABS 0.1 06/09/2020 1018   CMP Latest Ref Rng & Units 06/09/2020 04/04/2020 01/28/2020  Glucose 70 - 99 mg/dL 108(H) 132(H) 129(H)  BUN 8 - 23 mg/dL _0 Creatinine 0.44 - 1.00 mg/dL 0.74 0.75 0.96  Sodium 135 - 145 mmol/L 140 139 139  Potassium 3.5 - 5.1 mmol/L 4.3 4.2 4.1  Chloride 98 - 111 mmol/L 103 107 101  CO2 22 - 32 mmol/L _1 Calcium 8.9 - 10.3 mg/dL 9.9 9.2 9.5  Total Protein 6.5 - 8.1 g/dL 7.0 6.3(L) 6.7  Total Bilirubin 0.3 - 1.2 mg/dL 0.6 0.3 0.3    Alkaline Phos 38 - 126 U/L 89 86 100  AST 15 - 41 U/L _2 ALT 0 - 44 U/L _3 All questions were answered to patient's stated satisfaction. Encouraged patient to call with any new concerns or questions before his next visit to the cancer center and we can certain see him sooner, if needed.     ASSESSMENT & PLAN:  Iron deficiency anemia 1.  Iron deficiency anemia: -Originally noted in 2010 worked up by gastroenterology with endoscopy and colonoscopy. -Anemia thought to be secondary to duodenal ulcers and poor absorption. -Anemia continued, repeated endoscopy and colonoscopy in 2015 did not reveal any evidence of bleeding.  Anemia thought to be secondary to poor absorption. -Repeat endoscopy and colonoscopy in 2019 did not reveal any evidence of bleeding. -Bone marrow biopsy completed in September 2020 showed normal marrow.  No evidence of hematologic neoplasm or morphology.  Normal cytogenetics.  No increase in blasts on flow cytometry. -Patient is unable to tolerate p.o. iron supplements due to GI upset.  However she is continuing to take iron tablets on and off. -Patient is unable to tolerate Venofer due to flulike symptoms.  She does not respond to IV Ferrlecit. -Patient has been extensively worked up for her anemia.  It appears anemia is most likely secondary to poor GI absorption. -Work-up labs done on 12/03/2019 showed SPEP negative.  Creatinine 0.83, vitamin B12 221, WBC 6.5, hemoglobin 10.1, platelets 254. -Patient reports she felt better after her Injectafer infusions but is starting to feel fatigued again. -Last Feraheme infusions were on 04/21/2020, 05/17/2020, 05/05/2020 with premeds -We sent her to GI she had her colonoscopy on 05/25/2020 which showed examined portion of the ileum was normal.  A 4 mm polyp in the transverse colon, removed.  Diverticulosis in the sigmoid colon.  Also found a 2 mm polyp in the descending colon, removed.  The distal rectum and anal  verge are  normal on retroflexion view. -Endoscopy on 05/25/2020 showed normal esophagus.  5 cm hiatal hernia.  Cameron lesions.  Erosive gastropathy with no stigmata of recent bleeding, biopsied.  Normal examined duodenum. -Also showed on exam a large gallstone.  She is scheduled to have surgery to remove her gallstone.  GI suggests that the [facility] lesions were the cause of her anemia. -Labs done on 06/16/2020 showed hemoglobin 12.8, ferritin 41, percent saturation 12 -We will hold off on iron infusions at this time. -We will follow-up in 2 months with repeat labs.     Orders placed this encounter:  Orders Placed This Encounter  Procedures  . CBC with Differential/Platelet  . Comprehensive metabolic panel  . Ferritin  . Iron and TIBC  . Lactate dehydrogenase  . Vitamin B12  . VITAMIN D 25 Hydroxy (Vit-D Deficiency, Fractures)  . Folate      Francene Finders, FNP-C [place] [phone]

## 2020-06-16 NOTE — Patient Instructions (Signed)
[place] at [facility] Discharge Instructions  You saw Francene Finders, NP today.   Thank you for choosing [place] at [facility] to provide your oncology and hematology care.  To afford each patient quality time with our provider, please arrive at least 15 minutes before your scheduled appointment time.   If you have a lab appointment with the [facility] please come in thru the Main Entrance and check in at the main information desk.  You need to re-schedule your appointment should you arrive 10 or more minutes late.  We strive to give you quality time with our providers, and arriving late affects you and other patients whose appointments are after yours.  Also, if you no show three or more times for appointments you may be dismissed from the clinic at the providers discretion.     Again, thank you for choosing [facility].  Our hope is that these requests will decrease the amount of time that you wait before being seen by our physicians.       _____________________________________________________________  Should you have questions after your visit to [facility], please contact our office at [phone] and follow the prompts.  Our office hours are 8:00 a.m. and 4:30 p.m. Monday - Friday.  Please note that voicemails left after 4:00 p.m. may not be returned until the following business day.  We are closed weekends and major holidays.  You do have access to a nurse 24-7, just call the main number to the clinic [phone] and do not press any options, hold on the line and a nurse will answer the phone.    For prescription refill requests, have your pharmacy contact our office and allow 72 hours.    Due to Covid, you will need to wear a mask upon entering the hospital. If you do not have a mask, a mask will be given to you at the Main Entrance upon arrival. For doctor visits, patients may have 1 support person age 73  or older with them. For treatment visits, patients can not have anyone with them due to social distancing guidelines and our immunocompromised population.

## 2020-07-07 ENCOUNTER — Ambulatory Visit: Payer: Self-pay | Admitting: Surgery

## 2020-07-07 DIAGNOSIS — K449 Diaphragmatic hernia without obstruction or gangrene: Secondary | ICD-10-CM | POA: Insufficient documentation

## 2020-07-07 DIAGNOSIS — K8010 Calculus of gallbladder with chronic cholecystitis without obstruction: Secondary | ICD-10-CM | POA: Insufficient documentation

## 2020-07-07 DIAGNOSIS — K257 Chronic gastric ulcer without hemorrhage or perforation: Secondary | ICD-10-CM | POA: Insufficient documentation

## 2020-07-07 DIAGNOSIS — R0609 Other forms of dyspnea: Secondary | ICD-10-CM | POA: Insufficient documentation

## 2020-07-07 DIAGNOSIS — E669 Obesity, unspecified: Secondary | ICD-10-CM | POA: Insufficient documentation

## 2020-07-10 ENCOUNTER — Telehealth: Payer: Self-pay | Admitting: *Deleted

## 2020-07-10 NOTE — Telephone Encounter (Signed)
Records from Dr Johney Maine placed on Dr Woodward Ku desk for review for possible esophageal manometry. Patient sees Dr Jenetta Downer for GI care but would like manometry to evaluate for [facility] repair surgery.

## 2020-07-14 ENCOUNTER — Telehealth: Payer: Self-pay | Admitting: *Deleted

## 2020-07-14 NOTE — Progress Notes (Signed)
Electrophysiology Office Note:    Date:  07/15/2020   ID:  Annette Frank, DOB Mar 16, 1951, MRN [id number]  PCP:  Thea Alken  [facility] Cardiologist:  No primary care provider on file.  [facility] Electrophysiologist:  Vickie Epley, MD   Referring MD: Michael Boston, MD   Chief Complaint: Pre operative cardiovascular exam  History of Present Illness:    Annette Frank is a 69 y.o. female who presents for an evaluation of her perioperative risk before hernia repair at the request of Dr Johney Maine. Their medical history includes iron deficiency anemia, obesity and hiatal hernia. She is tired all the time and short of breath. Symptoms do correlate with her fluctuations in Hgb. She receives iron injections by her hematologist. She is also having symptoms related to gallbladder disease including nausea, back pain/rib pain. Her surgeon may take the gallbladder out at the same time as the hernia repair.  Past Medical History:  Diagnosis Date  . Anemia   . Arthritis   . Dysphagia   . Heartburn   . Hiatal hernia   . Obesity   . Thyroid disease     Past Surgical History:  Procedure Laterality Date  . ABDOMINAL HYSTERECTOMY    . BIOPSY  05/25/2020   Procedure: BIOPSY;  Surgeon: Harvel Quale, MD;  [facility];  Service: Gastroenterology;;  duodenum  . COLONOSCOPY WITH PROPOFOL N/A 05/25/2020   Procedure: COLONOSCOPY WITH PROPOFOL;  Surgeon: Harvel Quale, MD;  [facility];  Service: Gastroenterology;  Laterality: N/A;  1045  . ESOPHAGOGASTRODUODENOSCOPY (EGD) WITH PROPOFOL N/A 05/25/2020   Procedure: ESOPHAGOGASTRODUODENOSCOPY (EGD) WITH PROPOFOL;  Surgeon: Harvel Quale, MD;  [facility];  Service: Gastroenterology;  Laterality: N/A;  . LUMBAR LAMINECTOMY N/A 2001  . MEDIAL PARTIAL KNEE REPLACEMENT Bilateral 2009 & 2015  . POLYPECTOMY  05/25/2020   Procedure: POLYPECTOMY;  Surgeon: Harvel Quale, MD;  [facility];  Service: Gastroenterology;;  colon  . TOTAL HIP ARTHROPLASTY Right 2014  . UMBILICAL HERNIA REPAIR N/A 1955    Current Medications: Current Meds  Medication Sig  . acetaminophen (TYLENOL) 500 MG tablet Take 1,000 mg by mouth every 6 (six) hours as needed for moderate pain.   Marland Kitchen albuterol (VENTOLIN HFA) 108 (90 Base) MCG/ACT inhaler Inhale 1-2 puffs into the lungs every 6 (six) hours as needed for wheezing or shortness of breath.   Marland Kitchen atenolol (TENORMIN) 25 MG tablet Take 25 mg by mouth daily.   Marland Kitchen azelastine (ASTELIN) 0.1 % nasal spray Place 1 spray into both nostrils daily as needed for allergies.   . cephALEXin (KEFLEX) 500 MG capsule Take 500 mg by mouth 4 (four) times daily.   . fluticasone (FLONASE) 50 MCG/ACT nasal spray Place 1 spray into both nostrils daily as needed for allergies.   Marland Kitchen levothyroxine (SYNTHROID) 100 MCG tablet Take 100 mcg by mouth daily before breakfast.  . Vitamin D, Ergocalciferol, (DRISDOL) 1.25 MG (50000 UT) CAPS capsule Take 50,000 Units by mouth once a week.     Allergies:   Cefuroxime axetil, Skelaxin [metaxalone], Codeine, and Tincture of benzoin [benzoin]   Social History   Socioeconomic History  . Marital status: Married    Spouse name: Not on file  . Number of children: 2  . Years of education: Not on file  . Highest education level: Not on file  Occupational History  . Occupation: RETIRED  Tobacco Use  . Smoking status: Never Smoker  .  Smokeless tobacco: Never Used  Vaping Use  . Vaping Use: Never used  Substance and Sexual Activity  . Alcohol use: Never  . Drug use: Not Currently  . Sexual activity: Not Currently  Other Topics Concern  . Not on file  Social History Narrative  . Not on file   Social Determinants of Health   Financial Resource Strain: Low Risk   . Difficulty of Paying Living Expenses: Not hard at all  Food Insecurity: No Food Insecurity  . Worried About Charity fundraiser in the  Last Year: Never true  . Ran Out of Food in the Last Year: Never true  Transportation Needs: No Transportation Needs  . Lack of Transportation (Medical): No  . Lack of Transportation (Non-Medical): No  Physical Activity: Inactive  . Days of Exercise per Week: 0 days  . Minutes of Exercise per Session: 0 min  Stress: No Stress Concern Present  . Feeling of Stress : Not at all  Social Connections: Moderately Integrated  . Frequency of Communication with Friends and Family: Three times a week  . Frequency of Social Gatherings with Friends and Family: Never  . Attends Religious Services: More than 4 times per year  . Active Member of Clubs or Organizations: No  . Attends Archivist Meetings: Never  . Marital Status: Married     Family History: The patient's family history includes Arthritis in her father; Cancer in her mother; Healthy in her son; Heart attack in her maternal grandfather; Hypertension in her brother, mother, and son; Hypothyroidism in her father.  ROS:   Please see the history of present illness.    All other systems reviewed and are negative.  EKGs/Labs/Other Studies Reviewed:    The following studies were reviewed today: Outside records, ECG, CT scan  06/07/2020 CT Scan chest personally reviewed Aortic and LAD/LCx/RCA calcifications. Large hiatal/paraesophageal hernia   EKG:  The ekg ordered today demonstrates sinus rhythm.  Recent Labs: 06/09/2020: ALT 24; BUN 10; Creatinine, Ser 0.74; Hemoglobin 12.8; Platelets 255; Potassium 4.3; Sodium 140  Recent Lipid Panel No results found for: CHOL, TRIG, HDL, CHOLHDL, VLDL, LDLCALC, LDLDIRECT  Physical Exam:    VS:  BP 128/72   Pulse 80   Ht 5\' 6"  (1.676 m)   Wt 234 lb 9.6 oz (106.4 kg)   SpO2 97%   BMI 37.87 kg/m     Wt Readings from Last 3 Encounters:  07/15/20 234 lb 9.6 oz (106.4 kg)  06/16/20 234 lb 11.2 oz (106.5 kg)  05/25/20 (!) 236 lb (107 kg)     GEN:  Well nourished, well developed  in no acute distress. Obese. HEENT: Normal NECK: No JVD; No carotid bruits LYMPHATICS: No lymphadenopathy CARDIAC: RRR, no murmurs, rubs, gallops RESPIRATORY:  Clear to auscultation without rales, wheezing or rhonchi  ABDOMEN: Soft, non-tender, non-distended MUSCULOSKELETAL:  No edema; No deformity  SKIN: Warm and dry NEUROLOGIC:  Alert and oriented x 3 PSYCHIATRIC:  Normal affect   ASSESSMENT:    1. Hiatal hernia   2. Obesity (BMI 30-39.9)   3. Pre-op evaluation   4. Coronary artery disease involving native heart without angina pectoris, unspecified vessel or lesion type    PLAN:    In order of problems listed above:  1. Hiatal hernia Causing GI symptoms and thought to be contributing to her iron deficiency anemia.  Given her resting dyspnea and exercise limitations related to her anemia, it is hard to estimate her exercise capacity and surgical risk.  She does have coronary artery disease based on coronary calcifications on a recent CT. I am recommending we obtain a nuclear stress test pre op to better assess her perioperative risk.  2. Obesity  3. CAD Stress test ordered.  F/u based on stress results.  Medication Adjustments/Labs and Tests Ordered: Current medicines are reviewed at length with the patient today.  Concerns regarding medicines are outlined above.  Orders Placed This Encounter  Procedures  . Myocardial Perfusion Imaging  . EKG 12-Lead   No orders of the defined types were placed in this encounter.    Signed, Lars Mage, MD, [facility]  07/15/2020 11:31 AM    Electrophysiology [place]

## 2020-07-14 NOTE — Telephone Encounter (Signed)
Surgeon's office is aware that pt already has a new pt appointment with Dr. Quentin Ore 07/15/20 and clearance will be addressed at that time.

## 2020-07-14 NOTE — Telephone Encounter (Signed)
   Primary Cardiologist: to establish care with Dr. Quentin Ore 07/15/20  Chart reviewed as part of pre-operative protocol coverage. Patient has not been seen by cardiology in the past, though has a new patient appointment 07/15/20 with Dr. Quentin Ore. Agree with plans to have her preoperative status addressed at that time. I will remove from the preop pool.   Abigail Butts, PA-C  07/14/2020, 10:04 AM

## 2020-07-14 NOTE — Telephone Encounter (Signed)
° °  [facility] Pre-operative Risk Assessment    HEARTCARE STAFF: - Please ensure there is not already an duplicate clearance open for this procedure. - Under Visit Info/Reason for Call, type in Other and utilize the format Clearance MM/DD/YY or Clearance TBD. Do not use dashes or single digits. - If request is for dental extraction, please clarify the # of teeth to be extracted.  Request for surgical clearance:  1. What type of surgery is being performed?   HIATAL HERNIA REPAIR   2. When is this surgery scheduled?  TBD   3. What type of clearance is required (medical clearance vs. Pharmacy clearance to hold med vs. Both)?  MEDICAL  4. Are there any medications that need to be held prior to surgery and how long? N/A   5. Practice name and name of physician performing surgery?  [facility] / DR. Johney Maine   6. What is the office phone number?  [phone]   7.   What is the office fax number?  [phone]  8.   Anesthesia type (None, local, MAC, general) ?  GENERAL    Annette Frank 07/14/2020, 6:59 AM  _________________________________________________________________   (provider comments below)

## 2020-07-15 ENCOUNTER — Ambulatory Visit (INDEPENDENT_AMBULATORY_CARE_PROVIDER_SITE_OTHER): Payer: Medicare Other | Admitting: Cardiology

## 2020-07-15 ENCOUNTER — Other Ambulatory Visit: Payer: Self-pay

## 2020-07-15 ENCOUNTER — Encounter: Payer: Self-pay | Admitting: Cardiology

## 2020-07-15 VITALS — BP 128/72 | HR 80 | Ht 66.0 in | Wt 234.6 lb

## 2020-07-15 DIAGNOSIS — Z01818 Encounter for other preprocedural examination: Secondary | ICD-10-CM

## 2020-07-15 DIAGNOSIS — E669 Obesity, unspecified: Secondary | ICD-10-CM | POA: Diagnosis not present

## 2020-07-15 DIAGNOSIS — I2510 Atherosclerotic heart disease of native coronary artery without angina pectoris: Secondary | ICD-10-CM

## 2020-07-15 DIAGNOSIS — K449 Diaphragmatic hernia without obstruction or gangrene: Secondary | ICD-10-CM | POA: Diagnosis not present

## 2020-07-15 NOTE — Patient Instructions (Addendum)
Medication Instructions:  Your physician recommends that you continue on your current medications as directed. Please refer to the Current Medication list given to you today.  *If you need a refill on your cardiac medications before your next appointment, please call your pharmacy*   Lab Work: None ordered.  If you have labs (blood work) drawn today and your tests are completely normal, you will receive your results only by: Marland Kitchen MyChart Message (if you have MyChart) OR . A paper copy in the mail If you have any lab test that is abnormal or we need to change your treatment, we will call you to review the results.   Testing/Procedures: Your physician has requested that you have a lexiscan myoview.  Please schedule for stress test   Follow-Up: At [facility], you and your health needs are our priority.  As part of our continuing mission to provide you with exceptional heart care, we have created designated Provider Care Teams.  These Care Teams include your primary Cardiologist (physician) and Advanced Practice Providers (APPs -  Physician Assistants and Nurse Practitioners) who all work together to provide you with the care you need, when you need it.  We recommend signing up for the patient portal called "MyChart".  Sign up information is provided on this After Visit Summary.  MyChart is used to connect with patients for Virtual Visits (Telemedicine).  Patients are able to view lab/test results, encounter notes, upcoming appointments, etc.  Non-urgent messages can be sent to your provider as well.   To learn more about what you can do with MyChart, go to [email or web address].    Your next appointment:   Your physician wants you to follow-up based on results of your stress test

## 2020-07-18 ENCOUNTER — Encounter (HOSPITAL_COMMUNITY): Payer: Self-pay | Admitting: *Deleted

## 2020-07-18 ENCOUNTER — Telehealth (HOSPITAL_COMMUNITY): Payer: Self-pay | Admitting: *Deleted

## 2020-07-18 NOTE — Telephone Encounter (Signed)
Patient given instructions for upcoming Nuclear Stress test. Patient was driving when instructions were given, letter sent to my chart with instructions per patient request. Annette Frank Jacqueline]

## 2020-07-20 ENCOUNTER — Ambulatory Visit (HOSPITAL_COMMUNITY): Payer: Medicare Other | Attending: Cardiovascular Disease

## 2020-07-20 ENCOUNTER — Other Ambulatory Visit: Payer: Self-pay

## 2020-07-20 DIAGNOSIS — Z01818 Encounter for other preprocedural examination: Secondary | ICD-10-CM | POA: Diagnosis present

## 2020-07-20 DIAGNOSIS — Z01810 Encounter for preprocedural cardiovascular examination: Secondary | ICD-10-CM

## 2020-07-20 DIAGNOSIS — K449 Diaphragmatic hernia without obstruction or gangrene: Secondary | ICD-10-CM | POA: Insufficient documentation

## 2020-07-20 DIAGNOSIS — I2510 Atherosclerotic heart disease of native coronary artery without angina pectoris: Secondary | ICD-10-CM | POA: Insufficient documentation

## 2020-07-20 LAB — MYOCARDIAL PERFUSION IMAGING
LV dias vol: 70 mL (ref 46–106)
LV sys vol: 16 mL
Peak HR: 95 {beats}/min
Rest HR: 78 {beats}/min
SDS: 0
SRS: 0
SSS: 0
TID: 0.85

## 2020-07-20 MED ORDER — TECHNETIUM TC 99M TETROFOSMIN IV KIT
32.8000 | PACK | Freq: Once | INTRAVENOUS | Status: AC | PRN
  Administered 2020-07-20: 32.8 via INTRAVENOUS
  Filled 2020-07-20: qty 33

## 2020-07-20 MED ORDER — REGADENOSON 0.4 MG/5ML IV SOLN
0.4000 mg | Freq: Once | INTRAVENOUS | Status: AC
  Administered 2020-07-20: 0.4 mg via INTRAVENOUS

## 2020-07-20 MED ORDER — TECHNETIUM TC 99M TETROFOSMIN IV KIT
9.9000 | PACK | Freq: Once | INTRAVENOUS | Status: AC | PRN
  Administered 2020-07-20: 9.9 via INTRAVENOUS
  Filled 2020-07-20: qty 10

## 2020-07-21 NOTE — Progress Notes (Signed)
Normal stress test. Can proceed with surgery.

## 2020-07-26 ENCOUNTER — Telehealth: Payer: Self-pay

## 2020-07-26 ENCOUNTER — Other Ambulatory Visit: Payer: Self-pay

## 2020-07-26 DIAGNOSIS — Z1159 Encounter for screening for other viral diseases: Secondary | ICD-10-CM

## 2020-07-26 DIAGNOSIS — K449 Diaphragmatic hernia without obstruction or gangrene: Secondary | ICD-10-CM

## 2020-07-26 DIAGNOSIS — K219 Gastro-esophageal reflux disease without esophagitis: Secondary | ICD-10-CM

## 2020-07-26 NOTE — Telephone Encounter (Signed)
Alisha from Dr. Clyda Greener office is calling back to check on referral. Has either one of you seen this referral on her desk? I have checked her office.

## 2020-07-26 NOTE — Telephone Encounter (Signed)
Scheduled the patient. Letter with information and instructions mailed. Notified Alisha of the appointment and thanked her for the referral.

## 2020-07-26 NOTE — Telephone Encounter (Signed)
Please schedule the procedure as direct, esophageal manometry at [facility] endo. Thanks

## 2020-07-26 NOTE — Telephone Encounter (Signed)
Per Dr. Quentin Ore "Normal stress test. Can proceed with surgery".  Given past medical history and time since last visit, based on ACC/AHA guidelines, Brittanny Levenhagen would be at acceptable risk for the planned procedure without further cardiovascular testing.   The patient was advised that if she develops new symptoms prior to surgery to contact our office to arrange for a follow-up visit, and she verbalized understanding.  I will route this recommendation to the requesting party via Epic fax function and remove from pre-op pool.  Please call with questions.  [place], [place] 07/26/2020, 12:53 PM

## 2020-07-26 NOTE — Telephone Encounter (Signed)
Records are in your office Dr. Silverio Decamp. Please review.

## 2020-07-26 NOTE — Telephone Encounter (Signed)
Referral for esophageal manometry received. Provider Dr Michael Boston Practice name [facility] [phone] Fax [phone] [facility] (referral coordinator) contacted with appointment information. Instructions for procedure mailed to the patient.

## 2020-08-04 ENCOUNTER — Encounter (HOSPITAL_COMMUNITY): Payer: Self-pay

## 2020-08-05 ENCOUNTER — Inpatient Hospital Stay (HOSPITAL_COMMUNITY): Payer: Medicare Other | Attending: Hematology

## 2020-08-05 ENCOUNTER — Other Ambulatory Visit: Payer: Self-pay

## 2020-08-05 DIAGNOSIS — Z79899 Other long term (current) drug therapy: Secondary | ICD-10-CM | POA: Diagnosis not present

## 2020-08-05 DIAGNOSIS — Z8049 Family history of malignant neoplasm of other genital organs: Secondary | ICD-10-CM | POA: Diagnosis not present

## 2020-08-05 DIAGNOSIS — Z8249 Family history of ischemic heart disease and other diseases of the circulatory system: Secondary | ICD-10-CM | POA: Diagnosis not present

## 2020-08-05 DIAGNOSIS — D509 Iron deficiency anemia, unspecified: Secondary | ICD-10-CM | POA: Insufficient documentation

## 2020-08-05 DIAGNOSIS — Z90710 Acquired absence of both cervix and uterus: Secondary | ICD-10-CM | POA: Insufficient documentation

## 2020-08-05 LAB — COMPREHENSIVE METABOLIC PANEL
ALT: 19 U/L (ref 0–44)
AST: 20 U/L (ref 15–41)
Albumin: 3.8 g/dL (ref 3.5–5.0)
Alkaline Phosphatase: 85 U/L (ref 38–126)
Anion gap: 9 (ref 5–15)
BUN: 11 mg/dL (ref 8–23)
CO2: 23 mmol/L (ref 22–32)
Calcium: 9 mg/dL (ref 8.9–10.3)
Chloride: 106 mmol/L (ref 98–111)
Creatinine, Ser: 0.84 mg/dL (ref 0.44–1.00)
GFR calc non Af Amer: >60 mL/min (ref >60)
Glucose, Bld: 132 mg/dL — ABNORMAL HIGH (ref 70–99)
Potassium: 3.8 mmol/L (ref 3.5–5.1)
Sodium: 138 mmol/L (ref 135–145)
Total Bilirubin: 0.5 mg/dL (ref 0.3–1.2)
Total Protein: 6.3 g/dL — ABNORMAL LOW (ref 6.5–8.1)

## 2020-08-05 LAB — CBC WITH DIFFERENTIAL/PLATELET
Abs Immature Granulocytes: 0.04 10*3/uL (ref 0.00–0.07)
Basophils Absolute: 0 10*3/uL (ref 0.0–0.1)
Basophils Relative: 1 %
Eosinophils Absolute: 0.3 10*3/uL (ref 0.0–0.5)
Eosinophils Relative: 5 %
HCT: 25.8 % — ABNORMAL LOW (ref 36.0–46.0)
Hemoglobin: 7.3 g/dL — ABNORMAL LOW (ref 12.0–15.0)
Immature Granulocytes: 1 %
Lymphocytes Relative: 15 %
Lymphs Abs: 0.8 10*3/uL (ref 0.7–4.0)
MCH: 22.5 pg — ABNORMAL LOW (ref 26.0–34.0)
MCHC: 28.3 g/dL — ABNORMAL LOW (ref 30.0–36.0)
MCV: 79.4 fL — ABNORMAL LOW (ref 80.0–100.0)
Monocytes Absolute: 0.5 10*3/uL (ref 0.1–1.0)
Monocytes Relative: 9 %
Neutro Abs: 3.6 10*3/uL (ref 1.7–7.7)
Neutrophils Relative %: 69 %
Platelets: 238 10*3/uL (ref 150–400)
RBC: 3.25 MIL/uL — ABNORMAL LOW (ref 3.87–5.11)
RDW: 18.6 % — ABNORMAL HIGH (ref 11.5–15.5)
WBC: 5.2 10*3/uL (ref 4.0–10.5)
nRBC: 0.4 % — ABNORMAL HIGH (ref 0.0–0.2)

## 2020-08-05 LAB — VITAMIN D 25 HYDROXY (VIT D DEFICIENCY, FRACTURES): Vit D, 25-Hydroxy: 54.29 ng/mL (ref 30–100)

## 2020-08-05 LAB — FERRITIN: Ferritin: 4 ng/mL — ABNORMAL LOW (ref 11–307)

## 2020-08-05 LAB — IRON AND TIBC
Iron: 13 ug/dL — ABNORMAL LOW (ref 28–170)
Saturation Ratios: 3 % — ABNORMAL LOW (ref 10.4–31.8)
TIBC: 502 ug/dL — ABNORMAL HIGH (ref 250–450)
UIBC: 489 ug/dL

## 2020-08-05 LAB — FOLATE: Folate: 14.1 ng/mL (ref >5.9)

## 2020-08-05 LAB — VITAMIN B12: Vitamin B-12: 252 pg/mL (ref 180–914)

## 2020-08-05 LAB — LACTATE DEHYDROGENASE: LDH: 119 U/L (ref 98–192)

## 2020-08-07 NOTE — Progress Notes (Signed)
[place], [place] [address]   CLINIC:  Medical Oncology/Hematology  PCP:  Thea Alken [address] [place] [address] [phone]   REASON FOR VISIT: Follow-up for iron deficiency anemia   CURRENT THERAPY: Intermittent iron infusions   INTERVAL HISTORY:  Annette Frank 69 y.o. female returns for routine follow-up for iron deficiency anemia.  Annette Frank called clinic 1 week early to me about her lab appointment due to severe shortness of breath with exertion and achiness.  She last received IV iron in July 2021 (04/28/2020, 05/05/2020).  She is scheduled to have an esophageal manometry on 08/29/2020 by Dr. Silverio Decamp thought to be contributing to her iron deficiency anemia.  Had nuclear stress test due to history of CAD and comorbidities prior to her surgery.  She reports shortness of breath with exertion and at rest over the past few weeks.  Having trouble sleeping due to achiness.  Denies any bleeding.   REVIEW OF SYSTEMS:  Review of Systems  Constitutional: Positive for fatigue. Negative for appetite change, fever and unexpected weight change.  HENT:   Negative for nosebleeds, sore throat and trouble swallowing.   Eyes: Negative.   Respiratory: Negative.  Negative for cough, shortness of breath and wheezing.   Cardiovascular: Negative.  Negative for chest pain and leg swelling.  Gastrointestinal: Negative for abdominal pain, blood in stool, constipation, diarrhea, nausea and vomiting.  Endocrine: Negative.   Genitourinary: Negative.  Negative for bladder incontinence, hematuria and nocturia.   Musculoskeletal: Positive for myalgias. Negative for back pain and flank pain.  Skin: Negative.   Neurological: Positive for dizziness. Negative for headaches, light-headedness and numbness.  Hematological: Negative.   Psychiatric/Behavioral: Negative for confusion. The patient is nervous/anxious.      PAST MEDICAL/SURGICAL HISTORY:  Past  Medical History:  Diagnosis Date  . Anemia   . Arthritis   . Dysphagia   . Heartburn   . Hiatal hernia   . Obesity   . Thyroid disease    Past Surgical History:  Procedure Laterality Date  . ABDOMINAL HYSTERECTOMY    . BIOPSY  05/25/2020   Procedure: BIOPSY;  Surgeon: Harvel Quale, MD;  Location: [facility];  Service: Gastroenterology;;  duodenum  . COLONOSCOPY WITH PROPOFOL N/A 05/25/2020   Procedure: COLONOSCOPY WITH PROPOFOL;  Surgeon: Harvel Quale, MD;  Location: [facility];  Service: Gastroenterology;  Laterality: N/A;  1045  . ESOPHAGOGASTRODUODENOSCOPY (EGD) WITH PROPOFOL N/A 05/25/2020   Procedure: ESOPHAGOGASTRODUODENOSCOPY (EGD) WITH PROPOFOL;  Surgeon: Harvel Quale, MD;  Location: [facility];  Service: Gastroenterology;  Laterality: N/A;  . LUMBAR LAMINECTOMY N/A 2001  . MEDIAL PARTIAL KNEE REPLACEMENT Bilateral 2009 & 2015  . POLYPECTOMY  05/25/2020   Procedure: POLYPECTOMY;  Surgeon: Harvel Quale, MD;  Location: [facility];  Service: Gastroenterology;;  colon  . TOTAL HIP ARTHROPLASTY Right 2014  . UMBILICAL HERNIA REPAIR N/A 1955     SOCIAL HISTORY:  Social History   Socioeconomic History  . Marital status: Married    Spouse name: Not on file  . Number of children: 2  . Years of education: Not on file  . Highest education level: Not on file  Occupational History  . Occupation: RETIRED  Tobacco Use  . Smoking status: Never Smoker  . Smokeless tobacco: Never Used  Vaping Use  . Vaping Use: Never used  Substance and Sexual Activity  . Alcohol use: Never  . Drug use: Not  Currently  . Sexual activity: Not Currently  Other Topics Concern  . Not on file  Social History Narrative  . Not on file   Social Determinants of Health   Financial Resource Strain: Low Risk   . Difficulty of Paying Living Expenses: Not hard at all  Food Insecurity: No Food Insecurity  . Worried About Charity fundraiser  in the Last Year: Never true  . Ran Out of Food in the Last Year: Never true  Transportation Needs: No Transportation Needs  . Lack of Transportation (Medical): No  . Lack of Transportation (Non-Medical): No  Physical Activity: Inactive  . Days of Exercise per Week: 0 days  . Minutes of Exercise per Session: 0 min  Stress: No Stress Concern Present  . Feeling of Stress : Not at all  Social Connections: Moderately Integrated  . Frequency of Communication with Friends and Family: Three times a week  . Frequency of Social Gatherings with Friends and Family: Never  . Attends Religious Services: More than 4 times per year  . Active Member of Clubs or Organizations: No  . Attends Archivist Meetings: Never  . Marital Status: Married  Human resources officer Violence: Not At Risk  . Fear of Current or Ex-Partner: No  . Emotionally Abused: No  . Physically Abused: No  . Sexually Abused: No    FAMILY HISTORY:  Family History  Problem Relation Age of Onset  . Hypertension Mother   . Cancer Mother        Endometrial cancer  . Hypothyroidism Father   . Arthritis Father   . Hypertension Brother   . Heart attack Maternal Grandfather   . Hypertension Son   . Healthy Son     CURRENT MEDICATIONS:  Outpatient Encounter Medications as of 08/08/2020  Medication Sig  . acetaminophen (TYLENOL) 500 MG tablet Take 1,000 mg by mouth every 6 (six) hours as needed for moderate pain.   Marland Kitchen albuterol (VENTOLIN HFA) 108 (90 Base) MCG/ACT inhaler Inhale 1-2 puffs into the lungs every 6 (six) hours as needed for wheezing or shortness of breath.   Marland Kitchen atenolol (TENORMIN) 25 MG tablet Take 25 mg by mouth daily.   Marland Kitchen azelastine (ASTELIN) 0.1 % nasal spray Place 1 spray into both nostrils daily as needed for allergies.   . cephALEXin (KEFLEX) 500 MG capsule Take 500 mg by mouth 4 (four) times daily.   . fluticasone (FLONASE) 50 MCG/ACT nasal spray Place 1 spray into both nostrils daily as needed for  allergies.   Marland Kitchen levothyroxine (SYNTHROID) 100 MCG tablet Take 100 mcg by mouth daily before breakfast.  . Vitamin D, Ergocalciferol, (DRISDOL) 1.25 MG (50000 UT) CAPS capsule Take 50,000 Units by mouth once a week.   No facility-administered encounter medications on file as of 08/08/2020.    ALLERGIES:  Allergies  Allergen Reactions  . Cefuroxime Axetil Nausea And Vomiting  . Skelaxin [Metaxalone] Nausea And Vomiting  . Codeine Palpitations  . Tincture Of Benzoin [Benzoin] Rash     PHYSICAL EXAM:  ECOG Performance status: 1  There were no vitals filed for this visit. There were no vitals filed for this visit.    Physical Exam Neurological:     Mental Status: She is alert.    Limited secondary to virtual visit  LABORATORY DATA:  I have reviewed the labs as listed.  CBC    Component Value Date/Time   WBC 5.2 08/05/2020 0936   RBC 3.25 (L) 08/05/2020 0936   HGB  7.3 (L) 08/05/2020 0936   HCT 25.8 (L) 08/05/2020 0936   PLT 238 08/05/2020 0936   MCV 79.4 (L) 08/05/2020 0936   MCH 22.5 (L) 08/05/2020 0936   MCHC 28.3 (L) 08/05/2020 0936   RDW 18.6 (H) 08/05/2020 0936   LYMPHSABS 0.8 08/05/2020 0936   MONOABS 0.5 08/05/2020 0936   EOSABS 0.3 08/05/2020 0936   BASOSABS 0.0 08/05/2020 0936   CMP Latest Ref Rng & Units 08/05/2020 06/09/2020 04/04/2020  Glucose 70 - 99 mg/dL 132(H) 108(H) 132(H)  BUN 8 - 23 mg/dL 11 10 13   Creatinine 0.44 - 1.00 mg/dL 0.84 0.74 0.75  Sodium 135 - 145 mmol/L 138 140 139  Potassium 3.5 - 5.1 mmol/L 3.8 4.3 4.2  Chloride 98 - 111 mmol/L 106 103 107  CO2 22 - 32 mmol/L 23 27 25   Calcium 8.9 - 10.3 mg/dL 9.0 9.9 9.2  Total Protein 6.5 - 8.1 g/dL 6.3(L) 7.0 6.3(L)  Total Bilirubin 0.3 - 1.2 mg/dL 0.5 0.6 0.3  Alkaline Phos 38 - 126 U/L 85 89 86  AST 15 - 41 U/L 20 19 16   ALT 0 - 44 U/L 19 24 19     All questions were answered to patient's stated satisfaction. Encouraged patient to call with any new concerns or questions before his next visit  to the cancer center and we can certain see him sooner, if needed.    ASSESSMENT & PLAN:  Iron deficiency anemia 1.  Iron deficiency anemia: -Originally noted in 2010 worked up by gastroenterology with endoscopy and colonoscopy. -Anemia thought to be secondary hiatal hernia, Lysbeth Galas lesions and poor iron absorption. -Has had several endoscopies and colonoscopies since 2015-no true cause of IDA -Bone marrow biopsy completed in September 2020 showed normal marrow.  No evidence of hematologic neoplasm or morphology.  Normal cytogenetics.  No increase in blasts on flow cytometry. -Patient is unable to tolerate p.o. iron supplements due to GI upset.  However she is continuing to take iron tablets on and off. -Patient is unable to tolerate Venofer due to flulike symptoms.  She does not respond to IV Ferrlecit. -SPEP negative.  --Colonscopy on 05/25/20 showed examined portion of the ileum was normal.  A 4 mm polyp in the transverse colon, removed.  Diverticulosis in the sigmoid colon.  Also found a 2 mm polyp in the descending colon, removed.  The distal rectum and anal verge are normal on retroflexion view. -EGD on 05/25/2020 showed normal esophagus.  5 cm hiatal hernia.  Cameron lesions.  Erosive gastropathy with no stigmata of recent bleeding, biopsied.  Normal examined duodenum.   --Plan is esophageal manometry with Dr. Silverio Decamp on 08/29/2020 for hiatal hernia repair and cameron leisons. . -Labs from 08/05/2020 show ferritin of 4 (41), iron saturations 3% (12%) and hemoglobin of 7.3 (12.8).  --We will give IV Feraheme x2 ASAP. --Recheck lab work prior to surgery --RTC 1 month post surgery for re-check of her labs.   I provided 15 minutes of non face-to-face telephone visit time during this encounter, and > 50% was spent counseling as documented under my assessment & plan.   Orders placed this encounter:  No orders of the defined types were placed in this encounter.  Faythe Casa, NP 08/08/2020  1:33 PM  [place] [phone]

## 2020-08-08 ENCOUNTER — Other Ambulatory Visit: Payer: Self-pay

## 2020-08-08 ENCOUNTER — Inpatient Hospital Stay (HOSPITAL_BASED_OUTPATIENT_CLINIC_OR_DEPARTMENT_OTHER): Payer: Medicare Other | Admitting: Oncology

## 2020-08-08 DIAGNOSIS — D509 Iron deficiency anemia, unspecified: Secondary | ICD-10-CM | POA: Diagnosis not present

## 2020-08-11 ENCOUNTER — Inpatient Hospital Stay (HOSPITAL_COMMUNITY): Payer: Medicare Other

## 2020-08-11 ENCOUNTER — Other Ambulatory Visit: Payer: Self-pay

## 2020-08-11 VITALS — BP 122/55 | HR 94 | Temp 97.1°F | Resp 18

## 2020-08-11 DIAGNOSIS — D509 Iron deficiency anemia, unspecified: Secondary | ICD-10-CM | POA: Diagnosis not present

## 2020-08-11 MED ORDER — METHYLPREDNISOLONE SODIUM SUCC 125 MG IJ SOLR
125.0000 mg | Freq: Once | INTRAMUSCULAR | Status: AC
  Administered 2020-08-11: 125 mg via INTRAVENOUS
  Filled 2020-08-11: qty 2

## 2020-08-11 MED ORDER — SODIUM CHLORIDE 0.9 % IV SOLN: INTRAVENOUS | Status: DC

## 2020-08-11 MED ORDER — SODIUM CHLORIDE 0.9 % IV SOLN
510.0000 mg | Freq: Once | INTRAVENOUS | Status: AC
  Administered 2020-08-11: 510 mg via INTRAVENOUS
  Filled 2020-08-11: qty 510

## 2020-08-11 MED ORDER — LORATADINE 10 MG PO TABS
10.0000 mg | ORAL_TABLET | Freq: Once | ORAL | Status: AC
  Administered 2020-08-11: 10 mg via ORAL
  Filled 2020-08-11: qty 1

## 2020-08-11 MED ORDER — ONDANSETRON HCL 4 MG/2ML IJ SOLN
4.0000 mg | Freq: Once | INTRAMUSCULAR | Status: AC
  Administered 2020-08-11: 4 mg via INTRAVENOUS
  Filled 2020-08-11: qty 2

## 2020-08-11 NOTE — Progress Notes (Signed)
Annette Frank presents today for IV iron infusion. Infusion tolerated without incident or complaint. See MAR for details. VSS prior to and post infusion. Discharged in satisfactory condition with follow up instructions.

## 2020-08-11 NOTE — Patient Instructions (Signed)
[facility] at [facility]  Discharge Instructions:  Ferumoxytol injection What is this medicine? FERUMOXYTOL is an iron complex. Iron is used to make healthy red blood cells, which carry oxygen and nutrients throughout the body. This medicine is used to treat iron deficiency anemia. This medicine may be used for other purposes; ask your health care provider or pharmacist if you have questions. COMMON BRAND NAME(S): Feraheme What should I tell my health care provider before I take this medicine? They need to know if you have any of these conditions:  anemia not caused by low iron levels  high levels of iron in the blood  magnetic resonance imaging (MRI) test scheduled  an unusual or allergic reaction to iron, other medicines, foods, dyes, or preservatives  pregnant or trying to get pregnant  breast-feeding How should I use this medicine? This medicine is for injection into a vein. It is given by a health care professional in a hospital or clinic setting. Talk to your pediatrician regarding the use of this medicine in children. Special care may be needed. Overdosage: If you think you have taken too much of this medicine contact a poison control center or emergency room at once. NOTE: This medicine is only for you. Do not share this medicine with others. What if I miss a dose? It is important not to miss your dose. Call your doctor or health care professional if you are unable to keep an appointment. What may interact with this medicine? This medicine may interact with the following medications:  other iron products This list may not describe all possible interactions. Give your health care provider a list of all the medicines, herbs, non-prescription drugs, or dietary supplements you use. Also tell them if you smoke, drink alcohol, or use illegal drugs. Some items may interact with your medicine. What should I watch for while using this medicine? Visit your  doctor or healthcare professional regularly. Tell your doctor or healthcare professional if your symptoms do not start to get better or if they get worse. You may need blood work done while you are taking this medicine. You may need to follow a special diet. Talk to your doctor. Foods that contain iron include: whole grains/cereals, dried fruits, beans, or peas, leafy green vegetables, and organ meats (liver, kidney). What side effects may I notice from receiving this medicine? Side effects that you should report to your doctor or health care professional as soon as possible:  allergic reactions like skin rash, itching or hives, swelling of the face, lips, or tongue  breathing problems  changes in blood pressure  feeling faint or lightheaded, falls  fever or chills  flushing, sweating, or hot feelings  swelling of the ankles or feet Side effects that usually do not require medical attention (report to your doctor or health care professional if they continue or are bothersome):  diarrhea  headache  nausea, vomiting  stomach pain This list may not describe all possible side effects. Call your doctor for medical advice about side effects. You may report side effects to FDA at 1-800-FDA-1088. Where should I keep my medicine? This drug is given in a hospital or clinic and will not be stored at home. NOTE: This sheet is a summary. It may not cover all possible information. If you have questions about this medicine, talk to your doctor, pharmacist, or health care provider.  2020 Elsevier/Gold Standard (2016-12-03 20:21:10)  _____________________________________________________________  Thank you for choosing [facility] at   [facility] to provide your oncology and hematology care.  To afford each patient quality time with our providers, please arrive at least 15 minutes before your scheduled appointment.  You need to re-schedule your appointment if you arrive 10 or  more minutes late.  We strive to give you quality time with our providers, and arriving late affects you and other patients whose appointments are after yours.  Also, if you no show three or more times for appointments you may be dismissed from the clinic.  Again, thank you for choosing [facility] at [facility]. Our hope is that these requests will allow you access to exceptional care and in a timely manner. _______________________________________________________________  If you have questions after your visit, please contact our office at [phone] between the hours of 8:30 a.m. and 5:00 p.m. Voicemails left after 4:30 p.m. will not be returned until the following business day. _______________________________________________________________  For prescription refill requests, have your pharmacy contact our office. _______________________________________________________________  Recommendations made by the consultant and any test results will be sent to your referring physician. _______________________________________________________________ 

## 2020-08-15 ENCOUNTER — Other Ambulatory Visit (HOSPITAL_COMMUNITY): Payer: Medicare Other

## 2020-08-18 ENCOUNTER — Inpatient Hospital Stay (HOSPITAL_COMMUNITY): Payer: Medicare Other

## 2020-08-18 ENCOUNTER — Other Ambulatory Visit: Payer: Self-pay

## 2020-08-18 VITALS — BP 116/44 | HR 71 | Temp 97.3°F | Resp 18

## 2020-08-18 DIAGNOSIS — D509 Iron deficiency anemia, unspecified: Secondary | ICD-10-CM

## 2020-08-18 MED ORDER — ONDANSETRON HCL 4 MG/2ML IJ SOLN
4.0000 mg | Freq: Once | INTRAMUSCULAR | Status: AC
  Administered 2020-08-18: 4 mg via INTRAVENOUS
  Filled 2020-08-18: qty 2

## 2020-08-18 MED ORDER — SODIUM CHLORIDE 0.9 % IV SOLN
510.0000 mg | Freq: Once | INTRAVENOUS | Status: AC
  Administered 2020-08-18: 510 mg via INTRAVENOUS
  Filled 2020-08-18: qty 510

## 2020-08-18 MED ORDER — SODIUM CHLORIDE 0.9 % IV SOLN: INTRAVENOUS | Status: DC

## 2020-08-18 MED ORDER — METHYLPREDNISOLONE SODIUM SUCC 125 MG IJ SOLR
125.0000 mg | Freq: Once | INTRAMUSCULAR | Status: AC
  Administered 2020-08-18: 125 mg via INTRAVENOUS
  Filled 2020-08-18: qty 2

## 2020-08-18 MED ORDER — LORATADINE 10 MG PO TABS
10.0000 mg | ORAL_TABLET | Freq: Once | ORAL | Status: AC
  Administered 2020-08-18: 10 mg via ORAL
  Filled 2020-08-18: qty 1

## 2020-08-18 NOTE — Patient Instructions (Signed)
[facility] at [facility]  Discharge Instructions:  Ferumoxytol injection What is this medicine? FERUMOXYTOL is an iron complex. Iron is used to make healthy red blood cells, which carry oxygen and nutrients throughout the body. This medicine is used to treat iron deficiency anemia. This medicine may be used for other purposes; ask your health care provider or pharmacist if you have questions. COMMON BRAND NAME(S): Feraheme What should I tell my health care provider before I take this medicine? They need to know if you have any of these conditions:  anemia not caused by low iron levels  high levels of iron in the blood  magnetic resonance imaging (MRI) test scheduled  an unusual or allergic reaction to iron, other medicines, foods, dyes, or preservatives  pregnant or trying to get pregnant  breast-feeding How should I use this medicine? This medicine is for injection into a vein. It is given by a health care professional in a hospital or clinic setting. Talk to your pediatrician regarding the use of this medicine in children. Special care may be needed. Overdosage: If you think you have taken too much of this medicine contact a poison control center or emergency room at once. NOTE: This medicine is only for you. Do not share this medicine with others. What if I miss a dose? It is important not to miss your dose. Call your doctor or health care professional if you are unable to keep an appointment. What may interact with this medicine? This medicine may interact with the following medications:  other iron products This list may not describe all possible interactions. Give your health care provider a list of all the medicines, herbs, non-prescription drugs, or dietary supplements you use. Also tell them if you smoke, drink alcohol, or use illegal drugs. Some items may interact with your medicine. What should I watch for while using this medicine? Visit your  doctor or healthcare professional regularly. Tell your doctor or healthcare professional if your symptoms do not start to get better or if they get worse. You may need blood work done while you are taking this medicine. You may need to follow a special diet. Talk to your doctor. Foods that contain iron include: whole grains/cereals, dried fruits, beans, or peas, leafy green vegetables, and organ meats (liver, kidney). What side effects may I notice from receiving this medicine? Side effects that you should report to your doctor or health care professional as soon as possible:  allergic reactions like skin rash, itching or hives, swelling of the face, lips, or tongue  breathing problems  changes in blood pressure  feeling faint or lightheaded, falls  fever or chills  flushing, sweating, or hot feelings  swelling of the ankles or feet Side effects that usually do not require medical attention (report to your doctor or health care professional if they continue or are bothersome):  diarrhea  headache  nausea, vomiting  stomach pain This list may not describe all possible side effects. Call your doctor for medical advice about side effects. You may report side effects to FDA at 1-800-FDA-1088. Where should I keep my medicine? This drug is given in a hospital or clinic and will not be stored at home. NOTE: This sheet is a summary. It may not cover all possible information. If you have questions about this medicine, talk to your doctor, pharmacist, or health care provider.  2020 Elsevier/Gold Standard (2016-12-03 20:21:10)  _____________________________________________________________  Thank you for choosing [facility] at   [facility] to provide your oncology and hematology care.  To afford each patient quality time with our providers, please arrive at least 15 minutes before your scheduled appointment.  You need to re-schedule your appointment if you arrive 10 or  more minutes late.  We strive to give you quality time with our providers, and arriving late affects you and other patients whose appointments are after yours.  Also, if you no show three or more times for appointments you may be dismissed from the clinic.  Again, thank you for choosing [facility] at [facility]. Our hope is that these requests will allow you access to exceptional care and in a timely manner. _______________________________________________________________  If you have questions after your visit, please contact our office at [phone] between the hours of 8:30 a.m. and 5:00 p.m. Voicemails left after 4:30 p.m. will not be returned until the following business day. _______________________________________________________________  For prescription refill requests, have your pharmacy contact our office. _______________________________________________________________  Recommendations made by the consultant and any test results will be sent to your referring physician. _______________________________________________________________ 

## 2020-08-18 NOTE — Progress Notes (Signed)
Annette Frank presents today for dose 2/2 Feraheme infusion. Last infusion received in August 11, 2020 without difficulties. Pre-medications given as ordered by MD in supportive therapy plan. Infusion tolerated without incident or complaint. See MAR for details. VSS prior to and post infusion. Patient observed for 30 minutes post infusion and was discharged in satisfactory condition with follow up instructions.

## 2020-08-19 ENCOUNTER — Telehealth (HOSPITAL_COMMUNITY): Payer: Medicare Other | Admitting: Nurse Practitioner

## 2020-08-22 ENCOUNTER — Other Ambulatory Visit (HOSPITAL_COMMUNITY): Payer: Self-pay | Admitting: Surgery

## 2020-08-22 DIAGNOSIS — D509 Iron deficiency anemia, unspecified: Secondary | ICD-10-CM

## 2020-08-23 ENCOUNTER — Other Ambulatory Visit: Payer: Self-pay

## 2020-08-23 ENCOUNTER — Inpatient Hospital Stay (HOSPITAL_COMMUNITY): Payer: Medicare Other

## 2020-08-23 DIAGNOSIS — D509 Iron deficiency anemia, unspecified: Secondary | ICD-10-CM | POA: Diagnosis not present

## 2020-08-23 LAB — VITAMIN B12: Vitamin B-12: 295 pg/mL (ref 180–914)

## 2020-08-23 LAB — CBC WITH DIFFERENTIAL/PLATELET
Abs Immature Granulocytes: 0.09 10*3/uL — ABNORMAL HIGH (ref 0.00–0.07)
Basophils Absolute: 0 10*3/uL (ref 0.0–0.1)
Basophils Relative: 1 %
Eosinophils Absolute: 0.3 10*3/uL (ref 0.0–0.5)
Eosinophils Relative: 4 %
HCT: 36.8 % (ref 36.0–46.0)
Hemoglobin: 10.1 g/dL — ABNORMAL LOW (ref 12.0–15.0)
Immature Granulocytes: 1 %
Lymphocytes Relative: 9 %
Lymphs Abs: 0.7 10*3/uL (ref 0.7–4.0)
MCH: 24 pg — ABNORMAL LOW (ref 26.0–34.0)
MCHC: 27.4 g/dL — ABNORMAL LOW (ref 30.0–36.0)
MCV: 87.6 fL (ref 80.0–100.0)
Monocytes Absolute: 0.7 10*3/uL (ref 0.1–1.0)
Monocytes Relative: 8 %
Neutro Abs: 5.9 10*3/uL (ref 1.7–7.7)
Neutrophils Relative %: 77 %
Platelets: 239 10*3/uL (ref 150–400)
RBC: 4.2 MIL/uL (ref 3.87–5.11)
RDW: 27.9 % — ABNORMAL HIGH (ref 11.5–15.5)
WBC: 7.7 10*3/uL (ref 4.0–10.5)
nRBC: 0 % (ref 0.0–0.2)

## 2020-08-23 LAB — RETICULOCYTES
Immature Retic Fract: 30.5 % — ABNORMAL HIGH (ref 2.3–15.9)
RBC.: 4.22 MIL/uL (ref 3.87–5.11)
Retic Count, Absolute: 231.7 10*3/uL — ABNORMAL HIGH (ref 19.0–186.0)
Retic Ct Pct: 5.5 % — ABNORMAL HIGH (ref 0.4–3.1)

## 2020-08-23 LAB — COMPREHENSIVE METABOLIC PANEL
ALT: 20 U/L (ref 0–44)
AST: 19 U/L (ref 15–41)
Albumin: 3.9 g/dL (ref 3.5–5.0)
Alkaline Phosphatase: 89 U/L (ref 38–126)
Anion gap: 8 (ref 5–15)
BUN: 11 mg/dL (ref 8–23)
CO2: 25 mmol/L (ref 22–32)
Calcium: 9.6 mg/dL (ref 8.9–10.3)
Chloride: 105 mmol/L (ref 98–111)
Creatinine, Ser: 0.83 mg/dL (ref 0.44–1.00)
GFR, Estimated: >60 mL/min (ref >60)
Glucose, Bld: 134 mg/dL — ABNORMAL HIGH (ref 70–99)
Potassium: 4.2 mmol/L (ref 3.5–5.1)
Sodium: 138 mmol/L (ref 135–145)
Total Bilirubin: 0.6 mg/dL (ref 0.3–1.2)
Total Protein: 6.5 g/dL (ref 6.5–8.1)

## 2020-08-23 LAB — LACTATE DEHYDROGENASE: LDH: 126 U/L (ref 98–192)

## 2020-08-23 LAB — FOLATE: Folate: 11.5 ng/mL (ref >5.9)

## 2020-08-25 ENCOUNTER — Other Ambulatory Visit (HOSPITAL_COMMUNITY)
Admission: RE | Admit: 2020-08-25 | Discharge: 2020-08-25 | Disposition: A | Discharge status: Home / Self Care | Payer: Medicare Other | Source: Ambulatory Visit | Attending: Gastroenterology | Admitting: Gastroenterology

## 2020-08-25 DIAGNOSIS — Z20822 Contact with and (suspected) exposure to covid-19: Secondary | ICD-10-CM | POA: Diagnosis not present

## 2020-08-25 DIAGNOSIS — Z01812 Encounter for preprocedural laboratory examination: Secondary | ICD-10-CM | POA: Insufficient documentation

## 2020-08-25 LAB — SARS CORONAVIRUS 2 (TAT 6-24 HRS): SARS Coronavirus 2: NEGATIVE

## 2020-08-26 LAB — METHYLMALONIC ACID, SERUM: Methylmalonic Acid, Quantitative: 187 nmol/L (ref 0–378)

## 2020-08-29 ENCOUNTER — Encounter (HOSPITAL_COMMUNITY)
Admission: RE | Disposition: A | Discharge status: Home / Self Care | Payer: Self-pay | Source: Home / Self Care | Attending: Gastroenterology

## 2020-08-29 ENCOUNTER — Ambulatory Visit (HOSPITAL_COMMUNITY)
Admission: RE | Admit: 2020-08-29 | Discharge: 2020-08-29 | Disposition: A | Discharge status: Home / Self Care | Payer: Medicare Other | Attending: Gastroenterology | Admitting: Gastroenterology

## 2020-08-29 DIAGNOSIS — K219 Gastro-esophageal reflux disease without esophagitis: Secondary | ICD-10-CM | POA: Insufficient documentation

## 2020-08-29 DIAGNOSIS — K449 Diaphragmatic hernia without obstruction or gangrene: Secondary | ICD-10-CM | POA: Diagnosis not present

## 2020-08-29 DIAGNOSIS — Z539 Procedure and treatment not carried out, unspecified reason: Secondary | ICD-10-CM | POA: Insufficient documentation

## 2020-08-29 SURGERY — INVASIVE LAB ABORTED CASE

## 2020-08-29 MED ORDER — LIDOCAINE VISCOUS HCL 2 % MT SOLN
OROMUCOSAL | Status: AC
  Filled 2020-08-29: qty 15

## 2020-08-29 SURGICAL SUPPLY — 2 items
FACESHIELD LNG OPTICON STERILE (SAFETY) IMPLANT
GLOVE BIO SURGEON STRL SZ8 (GLOVE) ×6 IMPLANT

## 2020-08-29 NOTE — Progress Notes (Signed)
Unable to pass manometry  probe past LES. Second RN in and attempted multiple times. Patient tolerated procedure well .

## 2020-08-30 ENCOUNTER — Telehealth: Payer: Self-pay

## 2020-08-30 NOTE — Telephone Encounter (Signed)
Dr Johney Maine notified of failed esophageal manometry.

## 2020-09-14 ENCOUNTER — Encounter (HOSPITAL_COMMUNITY): Payer: Self-pay | Admitting: Oncology

## 2020-10-11 ENCOUNTER — Encounter (HOSPITAL_COMMUNITY): Payer: Self-pay | Admitting: Oncology

## 2020-10-14 ENCOUNTER — Other Ambulatory Visit (HOSPITAL_COMMUNITY): Payer: Self-pay

## 2020-10-14 DIAGNOSIS — D509 Iron deficiency anemia, unspecified: Secondary | ICD-10-CM

## 2020-10-17 ENCOUNTER — Inpatient Hospital Stay (HOSPITAL_COMMUNITY): Payer: Medicare Other | Attending: Medical

## 2020-10-17 ENCOUNTER — Other Ambulatory Visit: Payer: Self-pay

## 2020-10-17 DIAGNOSIS — D509 Iron deficiency anemia, unspecified: Secondary | ICD-10-CM | POA: Insufficient documentation

## 2020-10-17 DIAGNOSIS — D649 Anemia, unspecified: Secondary | ICD-10-CM

## 2020-10-17 LAB — CBC WITH DIFFERENTIAL/PLATELET
Abs Immature Granulocytes: 0.03 10*3/uL (ref 0.00–0.07)
Basophils Absolute: 0.1 10*3/uL (ref 0.0–0.1)
Basophils Relative: 1 %
Eosinophils Absolute: 0.4 10*3/uL (ref 0.0–0.5)
Eosinophils Relative: 5 %
HCT: 23.9 % — ABNORMAL LOW (ref 36.0–46.0)
Hemoglobin: 6.5 g/dL — CL (ref 12.0–15.0)
Immature Granulocytes: 0 %
Lymphocytes Relative: 14 %
Lymphs Abs: 1.1 10*3/uL (ref 0.7–4.0)
MCH: 21 pg — ABNORMAL LOW (ref 26.0–34.0)
MCHC: 27.2 g/dL — ABNORMAL LOW (ref 30.0–36.0)
MCV: 77.3 fL — ABNORMAL LOW (ref 80.0–100.0)
Monocytes Absolute: 0.7 10*3/uL (ref 0.1–1.0)
Monocytes Relative: 9 %
Neutro Abs: 5.6 10*3/uL (ref 1.7–7.7)
Neutrophils Relative %: 71 %
Platelets: 284 10*3/uL (ref 150–400)
RBC: 3.09 MIL/uL — ABNORMAL LOW (ref 3.87–5.11)
RDW: 21.5 % — ABNORMAL HIGH (ref 11.5–15.5)
WBC: 7.8 10*3/uL (ref 4.0–10.5)
nRBC: 0.4 % — ABNORMAL HIGH (ref 0.0–0.2)

## 2020-10-17 LAB — COMPREHENSIVE METABOLIC PANEL
ALT: 18 U/L (ref 0–44)
AST: 19 U/L (ref 15–41)
Albumin: 3.9 g/dL (ref 3.5–5.0)
Alkaline Phosphatase: 86 U/L (ref 38–126)
Anion gap: 8 (ref 5–15)
BUN: 14 mg/dL (ref 8–23)
CO2: 24 mmol/L (ref 22–32)
Calcium: 9.1 mg/dL (ref 8.9–10.3)
Chloride: 104 mmol/L (ref 98–111)
Creatinine, Ser: 0.98 mg/dL (ref 0.44–1.00)
GFR, Estimated: >60 mL/min (ref >60)
Glucose, Bld: 151 mg/dL — ABNORMAL HIGH (ref 70–99)
Potassium: 4.2 mmol/L (ref 3.5–5.1)
Sodium: 136 mmol/L (ref 135–145)
Total Bilirubin: 0.3 mg/dL (ref 0.3–1.2)
Total Protein: 6.6 g/dL (ref 6.5–8.1)

## 2020-10-17 LAB — ABO/RH: ABO/RH(D): O POS

## 2020-10-17 LAB — PREPARE RBC (CROSSMATCH)

## 2020-10-18 ENCOUNTER — Other Ambulatory Visit: Payer: Self-pay

## 2020-10-18 ENCOUNTER — Inpatient Hospital Stay (HOSPITAL_COMMUNITY): Payer: Medicare Other

## 2020-10-18 DIAGNOSIS — D649 Anemia, unspecified: Secondary | ICD-10-CM

## 2020-10-18 DIAGNOSIS — D509 Iron deficiency anemia, unspecified: Secondary | ICD-10-CM

## 2020-10-18 MED ORDER — DIPHENHYDRAMINE HCL 25 MG PO CAPS
25.0000 mg | ORAL_CAPSULE | Freq: Once | ORAL | Status: AC
  Administered 2020-10-18: 11:00:00 25 mg via ORAL
  Filled 2020-10-18: qty 1

## 2020-10-18 MED ORDER — SODIUM CHLORIDE 0.9% IV SOLUTION
250.0000 mL | Freq: Once | INTRAVENOUS | Status: AC
  Administered 2020-10-18: 11:00:00 250 mL via INTRAVENOUS

## 2020-10-18 MED ORDER — ACETAMINOPHEN 325 MG PO TABS
650.0000 mg | ORAL_TABLET | Freq: Once | ORAL | Status: AC
  Administered 2020-10-18: 11:00:00 650 mg via ORAL
  Filled 2020-10-18: qty 2

## 2020-10-18 NOTE — Progress Notes (Signed)
Annette Frank presents today for blood transfusion per MD orders for hgb 6.5. She states she has been more fatigued and SOB lately and was worried her hgb was dropping. She is planned to have surgery in January. Per orders, we will transfuse 2 units PRBC today. Premedications given as orders and pt education, including signs to report and transfusion reaction, verbalized to pt. Understanding verbalized, no questions at this time.  Blood transfusions tolerated without incident or complaint. VSS throughout treatment. Discharged in satisfactory condition with follow up instructions.

## 2020-10-18 NOTE — Patient Instructions (Signed)
[facility] at [facility]  Discharge Instructions:  Blood Transfusion, Adult, Care After This sheet gives you information about how to care for yourself after your procedure. Your doctor may also give you more specific instructions. If you have problems or questions, contact your doctor. What can I expect after the procedure? After the procedure, it is common to have:  Bruising and soreness at the IV site.  A fever or chills on the day of the procedure. This may be your body's response to the new blood cells received.  A headache. Follow these instructions at home: Insertion site care      Follow instructions from your doctor about how to take care of your insertion site. This is where an IV tube was put into your vein. Make sure you: ? Wash your hands with soap and water before and after you change your bandage (dressing). If you cannot use soap and water, use hand sanitizer. ? Change your bandage as told by your doctor.  Check your insertion site every day for signs of infection. Check for: ? Redness, swelling, or pain. ? Bleeding from the site. ? Warmth. ? Pus or a bad smell. General instructions  Take over-the-counter and prescription medicines only as told by your doctor.  Rest as told by your doctor.  Go back to your normal activities as told by your doctor.  Keep all follow-up visits as told by your doctor. This is important. Contact a doctor if:  You have itching or red, swollen areas of skin (hives).  You feel worried or nervous (anxious).  You feel weak after doing your normal activities.  You have redness, swelling, warmth, or pain around the insertion site.  You have blood coming from the insertion site, and the blood does not stop with pressure.  You have pus or a bad smell coming from the insertion site. Get help right away if:  You have signs of a serious reaction. This may be coming from an allergy or the body's defense  system (immune system). Signs include: ? Trouble breathing or shortness of breath. ? Swelling of the face or feeling warm (flushed). ? Fever or chills. ? Head, chest, or back pain. ? Dark pee (urine) or blood in the pee. ? Widespread rash. ? Fast heartbeat. ? Feeling dizzy or light-headed. You may receive your blood transfusion in an outpatient setting. If so, you will be told whom to contact to report any reactions. These symptoms may be an emergency. Do not wait to see if the symptoms will go away. Get medical help right away. Call your local emergency services (911 in the [place] Do not drive yourself to the hospital. Summary  Bruising and soreness at the IV site are common.  Check your insertion site every day for signs of infection.  Rest as told by your doctor. Go back to your normal activities as told by your doctor.  Get help right away if you have signs of a serious reaction. This information is not intended to replace advice given to you by your health care provider. Make sure you discuss any questions you have with your health care provider. Document Revised: 04/09/2019 Document Reviewed: 04/09/2019 Elsevier Patient Education  2020 [employer]  _______________________________________________________________  Thank you for choosing [facility] at [facility] to provide your oncology and hematology care.  To afford each patient quality time with our providers, please arrive at least 15 minutes before your scheduled appointment.  You   need to re-schedule your appointment if you arrive 10 or more minutes late.  We strive to give you quality time with our providers, and arriving late affects you and other patients whose appointments are after yours.  Also, if you no show three or more times for appointments you may be dismissed from the clinic.  Again, thank you for choosing [facility] at [facility]. Our hope is that these requests will  allow you access to exceptional care and in a timely manner. _______________________________________________________________  If you have questions after your visit, please contact our office at [phone] between the hours of 8:30 a.m. and 5:00 p.m. Voicemails left after 4:30 p.m. will not be returned until the following business day. _______________________________________________________________  For prescription refill requests, have your pharmacy contact our office. _______________________________________________________________  Recommendations made by the consultant and any test results will be sent to your referring physician. _______________________________________________________________ 

## 2020-10-19 LAB — TYPE AND SCREEN
ABO/RH(D): O POS
Antibody Screen: NEGATIVE
Unit division: 0
Unit division: 0

## 2020-10-19 LAB — BPAM RBC
Blood Product Expiration Date: 202201242359
Blood Product Expiration Date: 202201242359
ISSUE DATE / TIME: 202112211107
ISSUE DATE / TIME: 202112211300
Unit Type and Rh: 5100
Unit Type and Rh: 5100

## 2020-10-19 NOTE — Patient Instructions (Addendum)
DUE TO COVID-19 ONLY ONE VISITOR IS ALLOWED TO COME WITH YOU AND STAY IN THE WAITING ROOM ONLY DURING PRE OP AND PROCEDURE DAY OF SURGERY. THE 1 VISITOR  MAY VISIT WITH YOU AFTER SURGERY IN YOUR PRIVATE ROOM DURING VISITING HOURS ONLY!  YOU NEED TO HAVE A COVID 19 TEST ON: 11/01/20 @  11:00 AM , THIS TEST MUST BE DONE BEFORE SURGERY,  COVID TESTING SITE [address] [place] [address], IT IS ON THE RIGHT GOING OUT [address] APPROXIMATELY  2 MINUTES PAST [employer] ON THE RIGHT. ONCE YOUR COVID TEST IS COMPLETED,  PLEASE BEGIN THE QUARANTINE INSTRUCTIONS AS OUTLINED IN YOUR HANDOUT.                Annette Frank   Your procedure is scheduled on: 11/04/20   Report to [facility] Main  Entrance   Report to short stay at: 5:30 AM     Call this number if you have problems the morning of surgery [phone]    Remember: Do not eat food or drink liquids :After Midnight.   BRUSH YOUR TEETH MORNING OF SURGERY AND RINSE YOUR MOUTH OUT, NO CHEWING GUM CANDY OR MINTS.   Take these medicines the morning of surgery with A SIP OF WATER: synthroid. Use Flonase and inhalers as usual.                               You may not have any metal on your body including hair pins and              piercings  Do not wear jewelry, make-up, lotions, powders or perfumes, deodorant             Do not wear nail polish on your fingernails.  Do not shave  48 hours prior to surgery.          Do not bring valuables to the hospital. [facility] IS NOT             RESPONSIBLE   FOR VALUABLES.  Contacts, dentures or bridgework may not be worn into surgery.  Leave suitcase in the car. After surgery it may be brought to your room.     Patients discharged the day of surgery will not be allowed to drive home. IF YOU ARE HAVING SURGERY AND GOING HOME THE SAME DAY, YOU MUST HAVE AN ADULT TO DRIVE YOU HOME AND BE WITH YOU FOR 24 HOURS. YOU MAY GO HOME BY TAXI OR UBER OR ORTHERWISE, BUT AN ADULT  MUST ACCOMPANY YOU HOME AND STAY WITH YOU FOR 24 HOURS.  Name and phone number of your driver:  Special Instructions: N/A              Please read over the following fact sheets you were given: _____________________________________________________________________          [facility] - Preparing for Surgery Before surgery, you can play an important role.  Because skin is not sterile, your skin needs to be as free of germs as possible.  You can reduce the number of germs on your skin by washing with CHG (chlorahexidine gluconate) soap before surgery.  CHG is an antiseptic cleaner which kills germs and bonds with the skin to continue killing germs even after washing. Please DO NOT use if you have an allergy to CHG or antibacterial soaps.  If your skin becomes reddened/irritated stop using the CHG  and inform your nurse when you arrive at Short Stay. Do not shave (including legs and underarms) for at least 48 hours prior to the first CHG shower.  You may shave your face/neck. Please follow these instructions carefully:  1.  Shower with CHG Soap the night before surgery and the  morning of Surgery.  2.  If you choose to wash your hair, wash your hair first as usual with your  normal  shampoo.  3.  After you shampoo, rinse your hair and body thoroughly to remove the  shampoo.                           4.  Use CHG as you would any other liquid soap.  You can apply chg directly  to the skin and wash                       Gently with a scrungie or clean washcloth.  5.  Apply the CHG Soap to your body ONLY FROM THE NECK DOWN.   Do not use on face/ open                           Wound or open sores. Avoid contact with eyes, ears mouth and genitals (private parts).                       Wash face,  Genitals (private parts) with your normal soap.             6.  Wash thoroughly, paying special attention to the area where your surgery  will be performed.  7.  Thoroughly rinse your body with warm water from the  neck down.  8.  DO NOT shower/wash with your normal soap after using and rinsing off  the CHG Soap.                9.  Pat yourself dry with a clean towel.            10.  Wear clean pajamas.            11.  Place clean sheets on your bed the night of your first shower and do not  sleep with pets. Day of Surgery : Do not apply any lotions/deodorants the morning of surgery.  Please wear clean clothes to the hospital/surgery center.  FAILURE TO FOLLOW THESE INSTRUCTIONS MAY RESULT IN THE CANCELLATION OF YOUR SURGERY PATIENT SIGNATURE_________________________________  NURSE SIGNATURE__________________________________  ________________________________________________________________________

## 2020-10-24 ENCOUNTER — Encounter (HOSPITAL_COMMUNITY): Payer: Self-pay

## 2020-10-24 ENCOUNTER — Other Ambulatory Visit: Payer: Self-pay

## 2020-10-24 ENCOUNTER — Encounter (HOSPITAL_COMMUNITY)
Admission: RE | Admit: 2020-10-24 | Discharge: 2020-10-24 | Disposition: A | Discharge status: Home / Self Care | Payer: Medicare Other | Source: Ambulatory Visit | Attending: Surgery | Admitting: Surgery

## 2020-10-24 ENCOUNTER — Ambulatory Visit: Payer: Self-pay | Admitting: Surgery

## 2020-10-24 DIAGNOSIS — Z01812 Encounter for preprocedural laboratory examination: Secondary | ICD-10-CM | POA: Insufficient documentation

## 2020-10-24 HISTORY — DX: Unspecified asthma, uncomplicated: J45.909

## 2020-10-24 HISTORY — DX: Hypothyroidism, unspecified: E03.9

## 2020-10-24 HISTORY — DX: Malignant (primary) neoplasm, unspecified: C80.1

## 2020-10-24 LAB — CBC
HCT: 31.9 % — ABNORMAL LOW (ref 36.0–46.0)
Hemoglobin: 9.1 g/dL — ABNORMAL LOW (ref 12.0–15.0)
MCH: 22.6 pg — ABNORMAL LOW (ref 26.0–34.0)
MCHC: 28.5 g/dL — ABNORMAL LOW (ref 30.0–36.0)
MCV: 79.2 fL — ABNORMAL LOW (ref 80.0–100.0)
Platelets: 268 10*3/uL (ref 150–400)
RBC: 4.03 MIL/uL (ref 3.87–5.11)
RDW: 22.1 % — ABNORMAL HIGH (ref 11.5–15.5)
WBC: 7.4 10*3/uL (ref 4.0–10.5)
nRBC: 0 % (ref 0.0–0.2)

## 2020-10-24 LAB — BASIC METABOLIC PANEL
Anion gap: 8 (ref 5–15)
BUN: 11 mg/dL (ref 8–23)
CO2: 24 mmol/L (ref 22–32)
Calcium: 9.6 mg/dL (ref 8.9–10.3)
Chloride: 106 mmol/L (ref 98–111)
Creatinine, Ser: 0.79 mg/dL (ref 0.44–1.00)
GFR, Estimated: >60 mL/min (ref >60)
Glucose, Bld: 103 mg/dL — ABNORMAL HIGH (ref 70–99)
Potassium: 4.6 mmol/L (ref 3.5–5.1)
Sodium: 138 mmol/L (ref 135–145)

## 2020-10-24 NOTE — Progress Notes (Addendum)
COVID Vaccine Completed:  Date COVID Vaccine completed: COVID vaccine manufacturer: Pfizer    Moderna   [employer]   PCP - Richarda Blade Cardiologist -   Chest x-ray - CT Chest: 06/08/20 EKG - 07/15/20 EPIC Stress Test -  ECHO -  Cardiac Cath -  Pacemaker/ICD device last checked:  Sleep Study -  CPAP -   Fasting Blood Sugar -  Checks Blood Sugar _____ times a day  Blood Thinner Instructions: Aspirin Instructions: Last Dose:  Anesthesia review:   Patient denies shortness of breath, fever, cough and chest pain at PAT appointment   Patient verbalized understanding of instructions that were given to them at the PAT appointment. Patient was also instructed that they will need to review over the PAT instructions again at home before surgery.

## 2020-10-24 NOTE — Progress Notes (Signed)
CRITICAL VALUE ALERT  Critical Value:  hgb 6.5  Date & Time Notied:  10/17/2020 at 111  Provider Notified: Dr. Al Pimple  Orders Received/Actions taken: transfuse 2 units PRBC

## 2020-10-24 NOTE — Progress Notes (Signed)
Lab. Results: Hemoglobin: 9.1 

## 2020-10-24 NOTE — H&P (View-Only) (Signed)
Annette Frank  DOB: [birth year] Married / Language: Cleophus Molt / Race: White Female   Patient Care Team: Thea Alken as PCP - General (Nurse Practitioner) Vickie Epley, MD as PCP - Electrophysiology (Cardiology) Harvel Quale, MD as Consulting Physician (Gastroenterology) Michael Boston, MD as Consulting Physician (General Surgery)  ` Patient sent for surgical consultation at the request of Harvel Quale, MD  Chief Complaint: Iron deficiency anemia with large hiatal hernia. ` ` The patient is a pleasant obese woman is had iron deficiency anemia for over a decade. Usually manage with oral iron but has now required RVing iron infusions. And repeat endoscopy done in July. Colonoscopy showing a few small polyps. 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 CT scan confirmed at least 50% of her stomach up in her chest. Surgical consultation offered. Patient had an umbilical hernia repair when she was 69 years old. Also has had a hysterectomy. No other abdominal surgeries.  She is to take Zantac for heartburn. Then switch to Pepcid. She is taking almost every day now. She tried to hold that when she was taking iron pills since there is concern about absorption but that made things worse. She is tried over-the-counter omeprazole but says it doesn't make her feel right. I cannot get her to tell me what that means. So was diagnosed with gallstones on a recent CAT scan. She's had episodes of upper abdominal pain even right-sided with fried foods and nausea. This seems different than her left upper quadrant pain. She does get some dysphagia to solid foods especially bread. That is worsened this year. If she is anemic, she gets short of breath after walking a block to her mailbox. If she is not anemic, she can walk about 20 minutes. She denies any cardiac issues. She's had hip and knee surgery in [place].  Catheter there is relatively well. She moves her bowels about every other day while she is on iron. She had a colonoscopy the disproving any major lesions. She feels like her bowels been somewhat loose since then.  (Review of systems as stated in this history (HPI) or in the review of systems. Otherwise all other 12 point ROS are negative) ` ` ###########################################`  This patient encounter took 65 minutes today to perform the following: obtain history, perform exam, review outside records, interpret tests & imaging, counsel the patient on their diagnosis; and, document this encounter, including findings & plan in the electronic health record (EHR).   Past Surgical History Darden Palmer, [place]; 07/07/2020 10:14 AM) Colon Polyp Removal - Colonoscopy  Hip Surgery  Right. 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 Spinal Surgery - Lower Back  Ventral / Umbilical Hernia Surgery  multiple  Diagnostic Studies History Darden Palmer, [place]; 07/07/2020 10:14 AM) Colonoscopy  within last year Mammogram  1-3 years ago  Allergies Darden Palmer, RMA; 07/07/2020 10:19 AM) Cefuroxime Axetil *CEPHALOSPORINS*  Skelaxin *MUSCULOSKELETAL THERAPY AGENTS*  Nausea, Vomiting. Codeine Phosphate *ANALGESICS - OPIOID*  Rapid pulse. Benzoin Tincture *DERMATOLOGICALS*  Allergies Reconciled   Medication History Darden Palmer, RMA; 07/07/2020 10:21 AM) Albuterol Sulfate HFA (108 (90 Base)MCG/ACT Aerosol Soln, Inhalation) Active. Atenolol (25MG  Tablet, Oral) Active. Azelastine HCl (0.1% Solution, Nasal) Active. Cephalexin (500MG  Capsule, Oral) Active. Fluticasone Propionate (50MCG/ACT Suspension, Nasal) Active. Levothyroxine Sodium (100MCG Tablet, Oral) Active. Vitamin D (Ergocalciferol) (1.25 MG(50000 UT) Capsule, Oral) Active. Tylenol (500MG  Capsule, Oral) Active. Medications Reconciled  Social History Darden Palmer, [place]; 07/07/2020 10:14  AM)  Caffeine use  Carbonated beverages, Tea. No alcohol use  No drug use  Tobacco use  Never smoker.  Family History Laurette Schimke, [place]; 07/07/2020 10:14 AM) Arthritis  Brother, Father, Mother. Bleeding disorder  Father. Cancer  Mother. Diabetes Mellitus  Father. Heart Disease  Family Members In General. Hypertension  Brother, Mother. Kidney Disease  Mother. Thyroid problems  Father.  Pregnancy / Birth History Laurette Schimke, [place]; 07/07/2020 10:14 AM) Age at menarche  14 years. Age of menopause  69-51 Contraceptive History  Oral contraceptives. Gravida  2 Irregular periods  Length (months) of breastfeeding  3-6 Maternal age  64-25 Para  2  Other Problems Laurette Schimke, [place]; 07/07/2020 10:14 AM) Chest pain  Diverticulosis  Gastroesophageal Reflux Disease  Migraine Headache  Thyroid Disease  Umbilical Hernia Repair     Review of Systems Misty Stanley Caldwell RMA; 07/07/2020 10:14 AM) General Present- Fatigue and Weight Gain. Not Present- Appetite Loss, Chills, Fever, Night Sweats and Weight Loss. Skin Not Present- Change in Wart/Mole, Dryness, Hives, Jaundice, New Lesions, Non-Healing Wounds, Rash and Ulcer. HEENT Present- Seasonal Allergies. Not Present- Earache, Hearing Loss, Hoarseness, Nose Bleed, Oral Ulcers, Ringing in the Ears, Sinus Pain, Sore Throat, Visual Disturbances, Wears glasses/contact lenses and Yellow Eyes. Respiratory Present- Difficulty Breathing. Not Present- Bloody sputum, Chronic Cough, Snoring and Wheezing. Breast Not Present- Breast Mass, Breast Pain, Nipple Discharge and Skin Changes. Cardiovascular Present- Chest Pain, Leg Cramps, Shortness of Breath and Swelling of Extremities. Not Present- Difficulty Breathing Lying Down, Palpitations and Rapid Heart Rate. Gastrointestinal Present- Indigestion and Nausea. Not Present- Abdominal Pain, Bloating, Bloody Stool, Change in Bowel Habits, Chronic diarrhea, Constipation, Difficulty Swallowing,  Excessive gas, Gets full quickly at meals, Hemorrhoids, Rectal Pain and Vomiting. Female Genitourinary Present- Nocturia. Not Present- Frequency, Painful Urination, Pelvic Pain and Urgency. Musculoskeletal Present- Back Pain, Joint Pain, Joint Stiffness and Muscle Pain. Not Present- Muscle Weakness and Swelling of Extremities. Neurological Present- Headaches. Not Present- Decreased Memory, Fainting, Numbness, Seizures, Tingling, Tremor, Trouble walking and Weakness. Psychiatric Present- Change in Sleep Pattern. Not Present- Anxiety, Bipolar, Depression, Fearful and Frequent crying. Endocrine Present- Heat Intolerance. Not Present- Cold Intolerance, Excessive Hunger, Hair Changes, Hot flashes and New Diabetes. Hematology Not Present- Blood Thinners, Easy Bruising, Excessive bleeding, Gland problems, HIV and Persistent Infections.  Vitals Misty Stanley Elmo RMA; 07/07/2020 10:21 AM) 07/07/2020 10:21 AM Weight: 234.13 lb Height: 66in Body Surface Area: 2.14 m Body Mass Index: 37.79 kg/m  Temp.: 98.33F  Pulse: 83 (Regular)  P.OX: 98% (Room air) BP: 132/74(Sitting, Left Arm, Standard)       Physical Exam Ardeth Sportsman MD; 07/07/2020 5:47 PM) General Mental Status-Alert. General Appearance-Not in acute distress, Not Sickly. Orientation-Oriented X3. Hydration-Well hydrated. Voice-Normal.  Integumentary Global Assessment Upon inspection and palpation of skin surfaces of the - Axillae: non-tender, no inflammation or ulceration, no drainage. and Distribution of scalp and body hair is normal. General Characteristics Temperature - normal warmth is noted.  Head and Neck Head-normocephalic, atraumatic with no lesions or palpable masses. Face Global Assessment - atraumatic, no absence of expression. Neck Global Assessment - no abnormal movements, no bruit auscultated on the right, no bruit auscultated on the left, no decreased range of motion,  non-tender. Trachea-midline. Thyroid Gland Characteristics - non-tender.  Eye Eyeball - Left-Extraocular movements intact, No Nystagmus - Left. Eyeball - Right-Extraocular movements intact, No Nystagmus - Right. Cornea - Left-No Hazy - Left. Cornea - Right-No Hazy - Right. Sclera/Conjunctiva - Left-No scleral icterus, No Discharge - Left. Sclera/Conjunctiva - Right-No scleral  icterus, No Discharge - Right. Pupil - Left-Direct reaction to light normal. Pupil - Right-Direct reaction to light normal.  ENMT Ears Pinna - Left - no drainage observed, no generalized tenderness observed. Pinna - Right - no drainage observed, no generalized tenderness observed. Nose and Sinuses External Inspection of the Nose - no destructive lesion observed. Inspection of the nares - Left - quiet respiration. Inspection of the nares - Right - quiet respiration. Mouth and Throat Lips - Upper Lip - no fissures observed, no pallor noted. Lower Lip - no fissures observed, no pallor noted. Nasopharynx - no discharge present. Oral Cavity/Oropharynx - Tongue - no dryness observed. Oral Mucosa - no cyanosis observed. Hypopharynx - no evidence of airway distress observed.  Chest and Lung Exam Inspection Movements - Normal and Symmetrical. Accessory muscles - No use of accessory muscles in breathing. Palpation Palpation of the chest reveals - Non-tender. Auscultation Breath sounds - Normal and Clear.  Cardiovascular Auscultation Rhythm - Regular. Murmurs & Other Heart Sounds - Auscultation of the heart reveals - No Murmurs and No Systolic Clicks.  Abdomen Inspection Inspection of the abdomen reveals - No Visible peristalsis and No Abnormal pulsations. Umbilicus - No Bleeding, No Urine drainage. Palpation/Percussion Palpation and Percussion of the abdomen reveal - Soft, Non Tender, No Rebound tenderness, No Rigidity (guarding) and No Cutaneous hyperesthesia. Note: Abdomen obese but soft. Mild  epigastric and upper quadrant discomfort but no Murphy sign. Soft. Not severely distended. No diastasis recti. No umbilical or other anterior abdominal wall hernias   Female Genitourinary Sexual Maturity Tanner 5 - Adult hair pattern. Note: No vaginal bleeding nor discharge   Peripheral Vascular Upper Extremity Inspection - Left - No Cyanotic nailbeds - Left, Not Ischemic. Inspection - Right - No Cyanotic nailbeds - Right, Not Ischemic.  Neurologic Neurologic evaluation reveals -normal attention span and ability to concentrate, able to name objects and repeat phrases. Appropriate fund of knowledge , normal sensation and normal coordination. Mental Status Affect - not angry, not paranoid. Cranial Nerves-Normal Bilaterally. Gait-Normal.  Neuropsychiatric Mental status exam performed with findings of-able to articulate well with normal speech/language, rate, volume and coherence, thought content normal with ability to perform basic computations and apply abstract reasoning and no evidence of hallucinations, delusions, obsessions or homicidal/suicidal ideation.  Musculoskeletal Global Assessment Spine, Ribs and Pelvis - no instability, subluxation or laxity. Right Upper Extremity - no instability, subluxation or laxity.  Lymphatic Head & Neck  General Head & Neck Lymphatics: Bilateral - Description - No Localized lymphadenopathy. Axillary  General Axillary Region: Bilateral - Description - No Localized lymphadenopathy. Femoral & Inguinal  Generalized Femoral & Inguinal Lymphatics: Left - Description - No Localized lymphadenopathy. Right - Description - No Localized lymphadenopathy.    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 Chest. Worsening episodes of dysphagia and heartburn. 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  She is frustrated wishes to  do something more aggressive. I think she be reasonable candidate for repair.  I discussed robotic/minimally invasive reduction and repair of hiatal hernia. Usually primary repair. Low threshold to use Phasix mesh to minimize recurrence given her obesity.  Another option is consider weight loss surgery given her elevated BMI to lower recurrence chance. She does not want to consider that and wishes to proceed with PEH repair first.  I would like to get preoperative manometry. Assess. Esophageal function. Ideally should do a Nissen fundoplication.  Consider  Toupet if I can get enough esophageal length. We will see. Hold off on any gastric imaging studies at this time.   Current Plans Pt Education - [facility] Esophageal Surgery Diet HCI (Dartanian Knaggs): discussed with patient and provided information. Pt Education - [facility] Laparoscopic Surgery HCI The anatomy & physiology of the foregut and anti-reflux mechanism was discussed. The pathophysiology of hiatal herniation and GERD was discussed. Natural history risks without surgery was discussed. The patient's symptoms are not adequately controlled by medicines and other non-operative treatments. I feel the risks of no intervention will lead to serious problems that outweigh the operative risks; therefore, I recommended surgery to reduce the hiatal hernia out of the chest and fundoplication to rebuild the anti-reflux valve and control reflux better. Need for a thorough workup to rule out the differential diagnosis and plan treatment was explained. I explained laparoscopic techniques with possible need for an open approach.  Risks such as bleeding, infection, abscess, leak, need for further treatment, heart attack, death, and other risks were discussed. I noted a good likelihood this will help address the problem. Goals of post-operative recovery were discussed as well. Possibility that this will not correct all symptoms was explained. Post-operative dysphagia,  need for short-term liquid & pureed diet, inability to vomit, possibility of reherniation, possible need for medicines to help control symptoms in addition to surgery were discussed. We will work to minimize complications. Educational handouts further explaining the pathology, treatment options, and dysphagia diet was given as well. Questions were answered. The patient expresses understanding & wishes to proceed with surgery.  Follow Up - Call [facility] office after tests / studies doneto discuss further plans You are being scheduled for surgery- Our schedulers will call you.  You should hear from our office's scheduling department within 5 working days about the location, date, and time of surgery. We try to make accommodations for patient's preferences in scheduling surgery, but sometimes the OR schedule or the surgeon's schedule prevents [place] from making those accommodations.  If you have not heard from our office [phone]) in 5 working days, call the office and ask for your surgeon's nurse.  If you have other questions about your diagnosis, plan, or surgery, call the office and ask for your surgeon's nurse.  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 I did discuss that sometimes cholecystectomy is done at the same time if she truly has symptoms. I suspect she would benefit from that. Another options consider hiatal hernia repair and regroup. If she has persistent symptoms, then to cholecystectomy. She would rather be aggressive and proceed with cholecystectomy in addition to the hiatal hernia repair. She not had too much time to the total surgery.  The anatomy & physiology of hepatobiliary & pancreatic function was discussed. The pathophysiology of gallbladder dysfunction was discussed. Natural history risks without surgery was discussed. I feel the risks of no  intervention will lead to serious problems that outweigh the operative risks; therefore, I recommended cholecystectomy to remove the pathology. I explained laparoscopic techniques with possible need for an open approach. Probable cholangiogram to evaluate the bilary tract was explained as well.  Risks such as bleeding, infection, abscess, leak, injury to other organs, need for further treatment, heart attack, death, and other risks were discussed. I noted a good likelihood this will help address the problem. Possibility that this will not correct all abdominal symptoms was explained. Goals of post-operative recovery were  discussed as well. We will work to minimize complications. An educational handout further explaining the pathology and treatment options was given as well. Questions were answered. The patient expresses understanding & wishes to proceed with surgery.  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 Hopefully with repair of the hiatal hernia, her iron deficiency will finally resolve.  I think it makes sense for her to get an IV iron infusion within a week or so surgery to get her better tanked up. 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 She is usually followed by Dr. Meda Coffee. She has a pacemaker followed by Dr. Caryl Comes. She is not on any aggressive blood thinners. I'm okay with her continuing aspirin perioperatively if needed. Cleared by them   Myocardial Perfusion Imaging: Result Notes    Vickie Epley, MD  07/21/2020 4:04 PM EDT      Normal stress test. Can proceed with surgery.      Adin Hector, MD, FACS, MASCRS Gastrointestinal and Minimally Invasive Surgery  [facility] [address] [place], [place] [address] [phone] Fax [phone] Main/Paging  CONTACT INFORMATION: Weekday (9AM-5PM) concerns: Call [facility] main office at [phone] Weeknight (5PM-9AM) or Weekend/Holiday concerns: Check [email or web address] for General Surgery [facility] coverage (Please, do not use SecureChat as it is not reliable communication to operating surgeons for immediate patient care)

## 2020-10-24 NOTE — H&P (Signed)
Annette Frank  DOB: Oct 04, 1951 Married / Language: Cleophus Molt / Race: White Female   Patient Care Team: Thea Alken as PCP - General (Nurse Practitioner) Vickie Epley, MD as PCP - Electrophysiology (Cardiology) Harvel Quale, MD as Consulting Physician (Gastroenterology) Michael Boston, MD as Consulting Physician (General Surgery)  ` Patient sent for surgical consultation at the request of Harvel Quale, MD  Chief Complaint: Iron deficiency anemia with large hiatal hernia. ` ` The patient is a pleasant obese woman is had iron deficiency anemia for over a decade. Usually manage with oral iron but has now required RVing iron infusions. And repeat endoscopy done in July. Colonoscopy showing a few small polyps. 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 CT scan confirmed at least 50% of her stomach up in her chest. Surgical consultation offered. Patient had an umbilical hernia repair when she was 69 years old. Also has had a hysterectomy. No other abdominal surgeries.  She is to take Zantac for heartburn. Then switch to Pepcid. She is taking almost every day now. She tried to hold that when she was taking iron pills since there is concern about absorption but that made things worse. She is tried over-the-counter omeprazole but says it doesn't make her feel right. I cannot get her to tell me what that means. So was diagnosed with gallstones on a recent CAT scan. She's had episodes of upper abdominal pain even right-sided with fried foods and nausea. This seems different than her left upper quadrant pain. She does get some dysphagia to solid foods especially bread. That is worsened this year. If she is anemic, she gets short of breath after walking a block to her mailbox. If she is not anemic, she can walk about 20 minutes. She denies any cardiac issues. She's had hip and knee surgery in [place].  Catheter there is relatively well. She moves her bowels about every other day while she is on iron. She had a colonoscopy the disproving any major lesions. She feels like her bowels been somewhat loose since then.  (Review of systems as stated in this history (HPI) or in the review of systems. Otherwise all other 12 point ROS are negative) ` ` ###########################################`  This patient encounter took 65 minutes today to perform the following: obtain history, perform exam, review outside records, interpret tests & imaging, counsel the patient on their diagnosis; and, document this encounter, including findings & plan in the electronic health record (EHR).   Past Surgical History Darden Palmer, [place]; 07/07/2020 10:14 AM) Colon Polyp Removal - Colonoscopy  Hip Surgery  Right. 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 Spinal Surgery - Lower Back  Ventral / Umbilical Hernia Surgery  multiple  Diagnostic Studies History Darden Palmer, [place]; 07/07/2020 10:14 AM) Colonoscopy  within last year Mammogram  1-3 years ago  Allergies Darden Palmer, RMA; 07/07/2020 10:19 AM) Cefuroxime Axetil *CEPHALOSPORINS*  Skelaxin *MUSCULOSKELETAL THERAPY AGENTS*  Nausea, Vomiting. Codeine Phosphate *ANALGESICS - OPIOID*  Rapid pulse. Benzoin Tincture *DERMATOLOGICALS*  Allergies Reconciled   Medication History Darden Palmer, RMA; 07/07/2020 10:21 AM) Albuterol Sulfate HFA (108 (90 Base)MCG/ACT Aerosol Soln, Inhalation) Active. Atenolol (25MG  Tablet, Oral) Active. Azelastine HCl (0.1% Solution, Nasal) Active. Cephalexin (500MG  Capsule, Oral) Active. Fluticasone Propionate (50MCG/ACT Suspension, Nasal) Active. Levothyroxine Sodium (100MCG Tablet, Oral) Active. Vitamin D (Ergocalciferol) (1.25 MG(50000 UT) Capsule, Oral) Active. Tylenol (500MG  Capsule, Oral) Active. Medications Reconciled  Social History Darden Palmer, [place]; 07/07/2020 10:14  AM)  Caffeine use  Carbonated beverages, Tea. No alcohol use  No drug use  Tobacco use  Never smoker.  Family History Laurette Schimke, [place]; 07/07/2020 10:14 AM) Arthritis  Brother, Father, Mother. Bleeding disorder  Father. Cancer  Mother. Diabetes Mellitus  Father. Heart Disease  Family Members In General. Hypertension  Brother, Mother. Kidney Disease  Mother. Thyroid problems  Father.  Pregnancy / Birth History Laurette Schimke, [place]; 07/07/2020 10:14 AM) Age at menarche  14 years. Age of menopause  69-51 Contraceptive History  Oral contraceptives. Gravida  2 Irregular periods  Length (months) of breastfeeding  3-6 Maternal age  64-25 Para  2  Other Problems Laurette Schimke, [place]; 07/07/2020 10:14 AM) Chest pain  Diverticulosis  Gastroesophageal Reflux Disease  Migraine Headache  Thyroid Disease  Umbilical Hernia Repair     Review of Systems Misty Stanley Caldwell RMA; 07/07/2020 10:14 AM) General Present- Fatigue and Weight Gain. Not Present- Appetite Loss, Chills, Fever, Night Sweats and Weight Loss. Skin Not Present- Change in Wart/Mole, Dryness, Hives, Jaundice, New Lesions, Non-Healing Wounds, Rash and Ulcer. HEENT Present- Seasonal Allergies. Not Present- Earache, Hearing Loss, Hoarseness, Nose Bleed, Oral Ulcers, Ringing in the Ears, Sinus Pain, Sore Throat, Visual Disturbances, Wears glasses/contact lenses and Yellow Eyes. Respiratory Present- Difficulty Breathing. Not Present- Bloody sputum, Chronic Cough, Snoring and Wheezing. Breast Not Present- Breast Mass, Breast Pain, Nipple Discharge and Skin Changes. Cardiovascular Present- Chest Pain, Leg Cramps, Shortness of Breath and Swelling of Extremities. Not Present- Difficulty Breathing Lying Down, Palpitations and Rapid Heart Rate. Gastrointestinal Present- Indigestion and Nausea. Not Present- Abdominal Pain, Bloating, Bloody Stool, Change in Bowel Habits, Chronic diarrhea, Constipation, Difficulty Swallowing,  Excessive gas, Gets full quickly at meals, Hemorrhoids, Rectal Pain and Vomiting. Female Genitourinary Present- Nocturia. Not Present- Frequency, Painful Urination, Pelvic Pain and Urgency. Musculoskeletal Present- Back Pain, Joint Pain, Joint Stiffness and Muscle Pain. Not Present- Muscle Weakness and Swelling of Extremities. Neurological Present- Headaches. Not Present- Decreased Memory, Fainting, Numbness, Seizures, Tingling, Tremor, Trouble walking and Weakness. Psychiatric Present- Change in Sleep Pattern. Not Present- Anxiety, Bipolar, Depression, Fearful and Frequent crying. Endocrine Present- Heat Intolerance. Not Present- Cold Intolerance, Excessive Hunger, Hair Changes, Hot flashes and New Diabetes. Hematology Not Present- Blood Thinners, Easy Bruising, Excessive bleeding, Gland problems, HIV and Persistent Infections.  Vitals Misty Stanley Elmo RMA; 07/07/2020 10:21 AM) 07/07/2020 10:21 AM Weight: 234.13 lb Height: 66in Body Surface Area: 2.14 m Body Mass Index: 37.79 kg/m  Temp.: 98.33F  Pulse: 83 (Regular)  P.OX: 98% (Room air) BP: 132/74(Sitting, Left Arm, Standard)       Physical Exam Ardeth Sportsman MD; 07/07/2020 5:47 PM) General Mental Status-Alert. General Appearance-Not in acute distress, Not Sickly. Orientation-Oriented X3. Hydration-Well hydrated. Voice-Normal.  Integumentary Global Assessment Upon inspection and palpation of skin surfaces of the - Axillae: non-tender, no inflammation or ulceration, no drainage. and Distribution of scalp and body hair is normal. General Characteristics Temperature - normal warmth is noted.  Head and Neck Head-normocephalic, atraumatic with no lesions or palpable masses. Face Global Assessment - atraumatic, no absence of expression. Neck Global Assessment - no abnormal movements, no bruit auscultated on the right, no bruit auscultated on the left, no decreased range of motion,  non-tender. Trachea-midline. Thyroid Gland Characteristics - non-tender.  Eye Eyeball - Left-Extraocular movements intact, No Nystagmus - Left. Eyeball - Right-Extraocular movements intact, No Nystagmus - Right. Cornea - Left-No Hazy - Left. Cornea - Right-No Hazy - Right. Sclera/Conjunctiva - Left-No scleral icterus, No Discharge - Left. Sclera/Conjunctiva - Right-No scleral  icterus, No Discharge - Right. Pupil - Left-Direct reaction to light normal. Pupil - Right-Direct reaction to light normal.  ENMT Ears Pinna - Left - no drainage observed, no generalized tenderness observed. Pinna - Right - no drainage observed, no generalized tenderness observed. Nose and Sinuses External Inspection of the Nose - no destructive lesion observed. Inspection of the nares - Left - quiet respiration. Inspection of the nares - Right - quiet respiration. Mouth and Throat Lips - Upper Lip - no fissures observed, no pallor noted. Lower Lip - no fissures observed, no pallor noted. Nasopharynx - no discharge present. Oral Cavity/Oropharynx - Tongue - no dryness observed. Oral Mucosa - no cyanosis observed. Hypopharynx - no evidence of airway distress observed.  Chest and Lung Exam Inspection Movements - Normal and Symmetrical. Accessory muscles - No use of accessory muscles in breathing. Palpation Palpation of the chest reveals - Non-tender. Auscultation Breath sounds - Normal and Clear.  Cardiovascular Auscultation Rhythm - Regular. Murmurs & Other Heart Sounds - Auscultation of the heart reveals - No Murmurs and No Systolic Clicks.  Abdomen Inspection Inspection of the abdomen reveals - No Visible peristalsis and No Abnormal pulsations. Umbilicus - No Bleeding, No Urine drainage. Palpation/Percussion Palpation and Percussion of the abdomen reveal - Soft, Non Tender, No Rebound tenderness, No Rigidity (guarding) and No Cutaneous hyperesthesia. Note: Abdomen obese but soft. Mild  epigastric and upper quadrant discomfort but no Murphy sign. Soft. Not severely distended. No diastasis recti. No umbilical or other anterior abdominal wall hernias   Female Genitourinary Sexual Maturity Tanner 5 - Adult hair pattern. Note: No vaginal bleeding nor discharge   Peripheral Vascular Upper Extremity Inspection - Left - No Cyanotic nailbeds - Left, Not Ischemic. Inspection - Right - No Cyanotic nailbeds - Right, Not Ischemic.  Neurologic Neurologic evaluation reveals -normal attention span and ability to concentrate, able to name objects and repeat phrases. Appropriate fund of knowledge , normal sensation and normal coordination. Mental Status Affect - not angry, not paranoid. Cranial Nerves-Normal Bilaterally. Gait-Normal.  Neuropsychiatric Mental status exam performed with findings of-able to articulate well with normal speech/language, rate, volume and coherence, thought content normal with ability to perform basic computations and apply abstract reasoning and no evidence of hallucinations, delusions, obsessions or homicidal/suicidal ideation.  Musculoskeletal Global Assessment Spine, Ribs and Pelvis - no instability, subluxation or laxity. Right Upper Extremity - no instability, subluxation or laxity.  Lymphatic Head & Neck  General Head & Neck Lymphatics: Bilateral - Description - No Localized lymphadenopathy. Axillary  General Axillary Region: Bilateral - Description - No Localized lymphadenopathy. Femoral & Inguinal  Generalized Femoral & Inguinal Lymphatics: Left - Description - No Localized lymphadenopathy. Right - Description - No Localized lymphadenopathy.    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 Chest. Worsening episodes of dysphagia and heartburn. 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  She is frustrated wishes to  do something more aggressive. I think she be reasonable candidate for repair.  I discussed robotic/minimally invasive reduction and repair of hiatal hernia. Usually primary repair. Low threshold to use Phasix mesh to minimize recurrence given her obesity.  Another option is consider weight loss surgery given her elevated BMI to lower recurrence chance. She does not want to consider that and wishes to proceed with PEH repair first.  I would like to get preoperative manometry. Assess. Esophageal function. Ideally should do a Nissen fundoplication.  Consider  Toupet if I can get enough esophageal length. We will see. Hold off on any gastric imaging studies at this time.   Current Plans Pt Education - [facility] Esophageal Surgery Diet HCI (Kobie Matkins): discussed with patient and provided information. Pt Education - [facility] Laparoscopic Surgery HCI The anatomy & physiology of the foregut and anti-reflux mechanism was discussed. The pathophysiology of hiatal herniation and GERD was discussed. Natural history risks without surgery was discussed. The patient's symptoms are not adequately controlled by medicines and other non-operative treatments. I feel the risks of no intervention will lead to serious problems that outweigh the operative risks; therefore, I recommended surgery to reduce the hiatal hernia out of the chest and fundoplication to rebuild the anti-reflux valve and control reflux better. Need for a thorough workup to rule out the differential diagnosis and plan treatment was explained. I explained laparoscopic techniques with possible need for an open approach.  Risks such as bleeding, infection, abscess, leak, need for further treatment, heart attack, death, and other risks were discussed. I noted a good likelihood this will help address the problem. Goals of post-operative recovery were discussed as well. Possibility that this will not correct all symptoms was explained. Post-operative dysphagia,  need for short-term liquid & pureed diet, inability to vomit, possibility of reherniation, possible need for medicines to help control symptoms in addition to surgery were discussed. We will work to minimize complications. Educational handouts further explaining the pathology, treatment options, and dysphagia diet was given as well. Questions were answered. The patient expresses understanding & wishes to proceed with surgery.  Follow Up - Call [facility] office after tests / studies doneto discuss further plans You are being scheduled for surgery- Our schedulers will call you.  You should hear from our office's scheduling department within 5 working days about the location, date, and time of surgery. We try to make accommodations for patient's preferences in scheduling surgery, but sometimes the OR schedule or the surgeon's schedule prevents [place] from making those accommodations.  If you have not heard from our office [phone]) in 5 working days, call the office and ask for your surgeon's nurse.  If you have other questions about your diagnosis, plan, or surgery, call the office and ask for your surgeon's nurse.  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 I did discuss that sometimes cholecystectomy is done at the same time if she truly has symptoms. I suspect she would benefit from that. Another options consider hiatal hernia repair and regroup. If she has persistent symptoms, then to cholecystectomy. She would rather be aggressive and proceed with cholecystectomy in addition to the hiatal hernia repair. She not had too much time to the total surgery.  The anatomy & physiology of hepatobiliary & pancreatic function was discussed. The pathophysiology of gallbladder dysfunction was discussed. Natural history risks without surgery was discussed. I feel the risks of no  intervention will lead to serious problems that outweigh the operative risks; therefore, I recommended cholecystectomy to remove the pathology. I explained laparoscopic techniques with possible need for an open approach. Probable cholangiogram to evaluate the bilary tract was explained as well.  Risks such as bleeding, infection, abscess, leak, injury to other organs, need for further treatment, heart attack, death, and other risks were discussed. I noted a good likelihood this will help address the problem. Possibility that this will not correct all abdominal symptoms was explained. Goals of post-operative recovery were  discussed as well. We will work to minimize complications. An educational handout further explaining the pathology and treatment options was given as well. Questions were answered. The patient expresses understanding & wishes to proceed with surgery.  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 Hopefully with repair of the hiatal hernia, her iron deficiency will finally resolve.  I think it makes sense for her to get an IV iron infusion within a week or so surgery to get her better tanked up. 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 She is usually followed by Dr. Meda Coffee. She has a pacemaker followed by Dr. Caryl Comes. She is not on any aggressive blood thinners. I'm okay with her continuing aspirin perioperatively if needed. Cleared by them   Myocardial Perfusion Imaging: Result Notes    Vickie Epley, MD  07/21/2020 4:04 PM EDT      Normal stress test. Can proceed with surgery.      Adin Hector, MD, FACS, MASCRS Gastrointestinal and Minimally Invasive Surgery  [facility] [address] [place], [place] [address] [phone] Fax [phone] Main/Paging  CONTACT INFORMATION: Weekday (9AM-5PM) concerns: Call [facility] main office at [phone] Weeknight (5PM-9AM) or Weekend/Holiday concerns: Check [email or web address] for General Surgery [facility] coverage (Please, do not use SecureChat as it is not reliable communication to operating surgeons for immediate patient care)

## 2020-10-25 ENCOUNTER — Encounter: Payer: Self-pay | Admitting: Surgery

## 2020-10-25 DIAGNOSIS — E119 Type 2 diabetes mellitus without complications: Secondary | ICD-10-CM | POA: Insufficient documentation

## 2020-10-27 NOTE — Anesthesia Preprocedure Evaluation (Addendum)
Anesthesia Evaluation  Patient identified by MRN, date of birth, ID band Patient awake    Reviewed: Allergy & Precautions, NPO status , Patient's Chart, lab work & pertinent test results, reviewed documented beta blocker date and time   History of Anesthesia Complications Negative for: history of anesthetic complications  Airway Mallampati: II  TM Distance: >3 FB Neck ROM: Full    Dental  (+) Dental Advisory Given, Caps   Pulmonary asthma ,    Pulmonary exam normal        Cardiovascular negative cardio ROS Normal cardiovascular exam   '21 Myoperfusion - The left ventricular ejection fraction is hyperdynamic (>65%). Nuclear stress EF: 78%. There was no ST segment deviation noted during stress. The study is normal. This is a low risk study.     Neuro/Psych negative neurological ROS  negative psych ROS   GI/Hepatic Neg liver ROS, hiatal hernia, PUD, GERD  Medicated and Controlled,  Endo/Other  Hypothyroidism  Obesity Denies DM   Renal/GU negative Renal ROS     Musculoskeletal  (+) Arthritis ,   Abdominal   Peds  Hematology  (+) anemia ,   Anesthesia Other Findings Covid test negative   Reproductive/Obstetrics                            Anesthesia Physical Anesthesia Plan  ASA: II  Anesthesia Plan: General   Post-op Pain Management:    Induction: Intravenous  PONV Risk Score and Plan: 4 or greater and Treatment may vary due to age or medical condition, Ondansetron, Dexamethasone and Midazolam  Airway Management Planned: Oral ETT  Additional Equipment: None  Intra-op Plan:   Post-operative Plan: Extubation in OR  Informed Consent: I have reviewed the patients History and Physical, chart, labs and discussed the procedure including the risks, benefits and alternatives for the proposed anesthesia with the patient or authorized representative who has indicated his/her  understanding and acceptance.     Dental advisory given  Plan Discussed with: CRNA and Anesthesiologist  Anesthesia Plan Comments:       Anesthesia Quick Evaluation

## 2020-10-31 ENCOUNTER — Ambulatory Visit: Payer: Self-pay | Admitting: Surgery

## 2020-11-01 ENCOUNTER — Other Ambulatory Visit (HOSPITAL_COMMUNITY)
Admission: RE | Admit: 2020-11-01 | Discharge: 2020-11-01 | Disposition: A | Discharge status: Home / Self Care | Payer: Medicare Other | Source: Ambulatory Visit | Attending: Surgery | Admitting: Surgery

## 2020-11-01 DIAGNOSIS — Z20822 Contact with and (suspected) exposure to covid-19: Secondary | ICD-10-CM | POA: Insufficient documentation

## 2020-11-01 DIAGNOSIS — Z01818 Encounter for other preprocedural examination: Secondary | ICD-10-CM | POA: Insufficient documentation

## 2020-11-01 LAB — SARS CORONAVIRUS 2 (TAT 6-24 HRS): SARS Coronavirus 2: NEGATIVE

## 2020-11-03 ENCOUNTER — Encounter (HOSPITAL_COMMUNITY): Payer: Self-pay | Admitting: Surgery

## 2020-11-03 ENCOUNTER — Other Ambulatory Visit (HOSPITAL_COMMUNITY): Payer: Medicare Other

## 2020-11-03 MED ORDER — CLINDAMYCIN PHOSPHATE 900 MG/50ML IV SOLN
900.0000 mg | INTRAVENOUS | Status: AC
  Administered 2020-11-04: 900 mg via INTRAVENOUS
  Filled 2020-11-03: qty 50

## 2020-11-03 MED ORDER — GENTAMICIN SULFATE 40 MG/ML IJ SOLN
5.0000 mg/kg | INTRAVENOUS | Status: AC
  Administered 2020-11-04: 390 mg via INTRAVENOUS
  Filled 2020-11-03: qty 9.75

## 2020-11-03 MED ORDER — BUPIVACAINE LIPOSOME 1.3 % IJ SUSP
20.0000 mL | Freq: Once | INTRAMUSCULAR | Status: DC
  Filled 2020-11-03: qty 20

## 2020-11-04 ENCOUNTER — Ambulatory Visit (HOSPITAL_COMMUNITY): Payer: Medicare Other | Admitting: Anesthesiology

## 2020-11-04 ENCOUNTER — Encounter (HOSPITAL_COMMUNITY)
Admission: RE | Disposition: A | Discharge status: Home / Self Care | Payer: Self-pay | Source: Home / Self Care | Attending: Surgery

## 2020-11-04 ENCOUNTER — Other Ambulatory Visit: Payer: Self-pay

## 2020-11-04 ENCOUNTER — Ambulatory Visit (HOSPITAL_COMMUNITY): Payer: Medicare Other | Admitting: Physician Assistant

## 2020-11-04 ENCOUNTER — Encounter (HOSPITAL_COMMUNITY): Payer: Self-pay | Admitting: Surgery

## 2020-11-04 ENCOUNTER — Inpatient Hospital Stay (HOSPITAL_COMMUNITY)
Admission: RE | Admit: 2020-11-04 | Discharge: 2020-11-06 | DRG: 327 | Disposition: A | Discharge status: Home / Self Care | Payer: Medicare Other | Attending: Surgery | Admitting: Surgery

## 2020-11-04 DIAGNOSIS — Z950 Presence of cardiac pacemaker: Secondary | ICD-10-CM

## 2020-11-04 DIAGNOSIS — Z885 Allergy status to narcotic agent status: Secondary | ICD-10-CM | POA: Diagnosis not present

## 2020-11-04 DIAGNOSIS — R0609 Other forms of dyspnea: Secondary | ICD-10-CM | POA: Diagnosis present

## 2020-11-04 DIAGNOSIS — Z79890 Hormone replacement therapy (postmenopausal): Secondary | ICD-10-CM | POA: Diagnosis not present

## 2020-11-04 DIAGNOSIS — J45909 Unspecified asthma, uncomplicated: Secondary | ICD-10-CM | POA: Diagnosis present

## 2020-11-04 DIAGNOSIS — E119 Type 2 diabetes mellitus without complications: Secondary | ICD-10-CM | POA: Diagnosis present

## 2020-11-04 DIAGNOSIS — Z6837 Body mass index (BMI) 37.0-37.9, adult: Secondary | ICD-10-CM | POA: Diagnosis not present

## 2020-11-04 DIAGNOSIS — K219 Gastro-esophageal reflux disease without esophagitis: Secondary | ICD-10-CM | POA: Diagnosis present

## 2020-11-04 DIAGNOSIS — Z96641 Presence of right artificial hip joint: Secondary | ICD-10-CM | POA: Diagnosis present

## 2020-11-04 DIAGNOSIS — D509 Iron deficiency anemia, unspecified: Secondary | ICD-10-CM | POA: Diagnosis present

## 2020-11-04 DIAGNOSIS — K279 Peptic ulcer, site unspecified, unspecified as acute or chronic, without hemorrhage or perforation: Secondary | ICD-10-CM | POA: Diagnosis present

## 2020-11-04 DIAGNOSIS — Z888 Allergy status to other drugs, medicaments and biological substances status: Secondary | ICD-10-CM

## 2020-11-04 DIAGNOSIS — Z20822 Contact with and (suspected) exposure to covid-19: Secondary | ICD-10-CM | POA: Diagnosis present

## 2020-11-04 DIAGNOSIS — Z96653 Presence of artificial knee joint, bilateral: Secondary | ICD-10-CM | POA: Diagnosis present

## 2020-11-04 DIAGNOSIS — K8010 Calculus of gallbladder with chronic cholecystitis without obstruction: Secondary | ICD-10-CM | POA: Diagnosis present

## 2020-11-04 DIAGNOSIS — E669 Obesity, unspecified: Secondary | ICD-10-CM | POA: Diagnosis present

## 2020-11-04 DIAGNOSIS — K440 Diaphragmatic hernia with obstruction, without gangrene: Principal | ICD-10-CM | POA: Diagnosis present

## 2020-11-04 DIAGNOSIS — Z79899 Other long term (current) drug therapy: Secondary | ICD-10-CM

## 2020-11-04 DIAGNOSIS — Z9889 Other specified postprocedural states: Principal | ICD-10-CM

## 2020-11-04 DIAGNOSIS — D500 Iron deficiency anemia secondary to blood loss (chronic): Secondary | ICD-10-CM | POA: Diagnosis present

## 2020-11-04 DIAGNOSIS — R1310 Dysphagia, unspecified: Secondary | ICD-10-CM | POA: Diagnosis present

## 2020-11-04 DIAGNOSIS — R0600 Dyspnea, unspecified: Secondary | ICD-10-CM | POA: Diagnosis present

## 2020-11-04 DIAGNOSIS — E039 Hypothyroidism, unspecified: Secondary | ICD-10-CM | POA: Diagnosis present

## 2020-11-04 DIAGNOSIS — K257 Chronic gastric ulcer without hemorrhage or perforation: Secondary | ICD-10-CM | POA: Diagnosis present

## 2020-11-04 HISTORY — PX: XI ROBOTIC ASSISTED HIATAL HERNIA REPAIR: SHX6889

## 2020-11-04 LAB — TYPE AND SCREEN
ABO/RH(D): O POS
Antibody Screen: NEGATIVE

## 2020-11-04 SURGERY — REPAIR, HERNIA, HIATAL, ROBOT-ASSISTED
Anesthesia: General | Site: Abdomen

## 2020-11-04 MED ORDER — ROCURONIUM BROMIDE 10 MG/ML (PF) SYRINGE
PREFILLED_SYRINGE | INTRAVENOUS | Status: DC | PRN
  Administered 2020-11-04: 20 mg via INTRAVENOUS
  Administered 2020-11-04: 60 mg via INTRAVENOUS
  Administered 2020-11-04 (×2): 20 mg via INTRAVENOUS

## 2020-11-04 MED ORDER — SODIUM CHLORIDE 0.9 % IV SOLN: 250.0000 mL | INTRAVENOUS | Status: DC | PRN

## 2020-11-04 MED ORDER — ENOXAPARIN SODIUM 40 MG/0.4ML ~~LOC~~ SOLN
40.0000 mg | SUBCUTANEOUS | Status: DC
  Administered 2020-11-05 – 2020-11-06 (×2): 40 mg via SUBCUTANEOUS
  Filled 2020-11-04 (×2): qty 0.4

## 2020-11-04 MED ORDER — SIMETHICONE 80 MG PO CHEW
40.0000 mg | CHEWABLE_TABLET | Freq: Four times a day (QID) | ORAL | Status: DC | PRN
  Administered 2020-11-04 (×2): 40 mg via ORAL
  Filled 2020-11-04 (×2): qty 1

## 2020-11-04 MED ORDER — DIPHENHYDRAMINE HCL 12.5 MG/5ML PO ELIX: 12.5000 mg | ORAL_SOLUTION | Freq: Four times a day (QID) | ORAL | Status: DC | PRN

## 2020-11-04 MED ORDER — SUGAMMADEX SODIUM 500 MG/5ML IV SOLN
INTRAVENOUS | Status: AC
  Filled 2020-11-04: qty 5

## 2020-11-04 MED ORDER — CHLORHEXIDINE GLUCONATE CLOTH 2 % EX PADS: 6.0000 | MEDICATED_PAD | Freq: Once | CUTANEOUS | Status: DC

## 2020-11-04 MED ORDER — AZELASTINE HCL 0.1 % NA SOLN
1.0000 | Freq: Every day | NASAL | Status: DC
  Administered 2020-11-04: 1 via NASAL
  Filled 2020-11-04 (×2): qty 30

## 2020-11-04 MED ORDER — DEXAMETHASONE SODIUM PHOSPHATE 10 MG/ML IJ SOLN
INTRAMUSCULAR | Status: AC
  Filled 2020-11-04: qty 1

## 2020-11-04 MED ORDER — FENTANYL CITRATE (PF) 100 MCG/2ML IJ SOLN
INTRAMUSCULAR | Status: DC | PRN
  Administered 2020-11-04 (×2): 50 ug via INTRAVENOUS
  Administered 2020-11-04: 25 ug via INTRAVENOUS
  Administered 2020-11-04: 50 ug via INTRAVENOUS
  Administered 2020-11-04: 25 ug via INTRAVENOUS
  Administered 2020-11-04: 50 ug via INTRAVENOUS

## 2020-11-04 MED ORDER — OXYCODONE HCL 5 MG/5ML PO SOLN
5.0000 mg | ORAL | Status: DC | PRN
  Administered 2020-11-04 – 2020-11-05 (×2): 10 mg via ORAL
  Administered 2020-11-05 – 2020-11-06 (×2): 5 mg via ORAL
  Administered 2020-11-06: 10 mg via ORAL
  Filled 2020-11-04: qty 5
  Filled 2020-11-04 (×3): qty 10
  Filled 2020-11-04: qty 5

## 2020-11-04 MED ORDER — ENSURE PRE-SURGERY PO LIQD
296.0000 mL | Freq: Once | ORAL | Status: DC
  Filled 2020-11-04: qty 296

## 2020-11-04 MED ORDER — GABAPENTIN 300 MG PO CAPS
300.0000 mg | ORAL_CAPSULE | Freq: Two times a day (BID) | ORAL | Status: DC
  Administered 2020-11-04 – 2020-11-05 (×3): 300 mg via ORAL
  Filled 2020-11-04 (×3): qty 1

## 2020-11-04 MED ORDER — ACETAMINOPHEN 160 MG/5ML PO SOLN
650.0000 mg | Freq: Four times a day (QID) | ORAL | Status: DC
  Administered 2020-11-04 – 2020-11-06 (×7): 650 mg via ORAL
  Filled 2020-11-04 (×8): qty 20.3

## 2020-11-04 MED ORDER — FENTANYL CITRATE (PF) 100 MCG/2ML IJ SOLN
25.0000 ug | INTRAMUSCULAR | Status: DC | PRN
  Administered 2020-11-04: 25 ug via INTRAVENOUS

## 2020-11-04 MED ORDER — SUCCINYLCHOLINE CHLORIDE 200 MG/10ML IV SOSY
PREFILLED_SYRINGE | INTRAVENOUS | Status: DC | PRN
  Administered 2020-11-04: 140 mg via INTRAVENOUS

## 2020-11-04 MED ORDER — DEXAMETHASONE SODIUM PHOSPHATE 4 MG/ML IJ SOLN
4.0000 mg | Freq: Two times a day (BID) | INTRAMUSCULAR | Status: DC
  Administered 2020-11-04 – 2020-11-06 (×4): 4 mg via INTRAVENOUS
  Filled 2020-11-04 (×4): qty 1

## 2020-11-04 MED ORDER — LIDOCAINE HCL (PF) 2 % IJ SOLN
INTRAMUSCULAR | Status: AC
  Filled 2020-11-04: qty 5

## 2020-11-04 MED ORDER — MIDAZOLAM HCL 5 MG/5ML IJ SOLN
INTRAMUSCULAR | Status: DC | PRN
  Administered 2020-11-04 (×2): 1 mg via INTRAVENOUS

## 2020-11-04 MED ORDER — GABAPENTIN 300 MG PO CAPS
300.0000 mg | ORAL_CAPSULE | ORAL | Status: AC
  Administered 2020-11-04: 300 mg via ORAL
  Filled 2020-11-04: qty 1

## 2020-11-04 MED ORDER — BUPIVACAINE-EPINEPHRINE (PF) 0.25% -1:200000 IJ SOLN
INTRAMUSCULAR | Status: AC
  Filled 2020-11-04: qty 60

## 2020-11-04 MED ORDER — DEXAMETHASONE SODIUM PHOSPHATE 10 MG/ML IJ SOLN
INTRAMUSCULAR | Status: DC | PRN
  Administered 2020-11-04 (×2): 10 mg via INTRAVENOUS

## 2020-11-04 MED ORDER — OXYCODONE HCL 5 MG PO TABS: 5.0000 mg | ORAL_TABLET | Freq: Once | ORAL | Status: DC | PRN

## 2020-11-04 MED ORDER — ONDANSETRON HCL 4 MG/2ML IJ SOLN: 4.0000 mg | Freq: Once | INTRAMUSCULAR | Status: DC | PRN

## 2020-11-04 MED ORDER — PROPOFOL 10 MG/ML IV BOLUS
INTRAVENOUS | Status: DC | PRN
  Administered 2020-11-04: 150 mg via INTRAVENOUS

## 2020-11-04 MED ORDER — PROPOFOL 10 MG/ML IV BOLUS
INTRAVENOUS | Status: AC
  Filled 2020-11-04: qty 40

## 2020-11-04 MED ORDER — SODIUM CHLORIDE 0.9% FLUSH
3.0000 mL | Freq: Two times a day (BID) | INTRAVENOUS | Status: DC
  Administered 2020-11-04 – 2020-11-06 (×4): 3 mL via INTRAVENOUS

## 2020-11-04 MED ORDER — SCOPOLAMINE 1 MG/3DAYS TD PT72
1.0000 | MEDICATED_PATCH | TRANSDERMAL | Status: DC
  Administered 2020-11-04: 1.5 mg via TRANSDERMAL
  Filled 2020-11-04: qty 1

## 2020-11-04 MED ORDER — ONDANSETRON HCL 4 MG/2ML IJ SOLN
INTRAMUSCULAR | Status: DC | PRN
  Administered 2020-11-04: 4 mg via INTRAVENOUS

## 2020-11-04 MED ORDER — METOPROLOL TARTRATE 5 MG/5ML IV SOLN: 5.0000 mg | Freq: Four times a day (QID) | INTRAVENOUS | Status: DC | PRN

## 2020-11-04 MED ORDER — ROCURONIUM BROMIDE 10 MG/ML (PF) SYRINGE
PREFILLED_SYRINGE | INTRAVENOUS | Status: AC
  Filled 2020-11-04: qty 10

## 2020-11-04 MED ORDER — CHLORHEXIDINE GLUCONATE 0.12 % MT SOLN: 15.0000 mL | Freq: Once | OROMUCOSAL | Status: DC

## 2020-11-04 MED ORDER — LIDOCAINE 2% (20 MG/ML) 5 ML SYRINGE
INTRAMUSCULAR | Status: DC | PRN
  Administered 2020-11-04: 50 mg via INTRAVENOUS

## 2020-11-04 MED ORDER — ONDANSETRON HCL 4 MG/2ML IJ SOLN
INTRAMUSCULAR | Status: AC
  Filled 2020-11-04: qty 2

## 2020-11-04 MED ORDER — LEVOTHYROXINE SODIUM 100 MCG PO TABS
100.0000 ug | ORAL_TABLET | Freq: Every day | ORAL | Status: DC
  Administered 2020-11-05 – 2020-11-06 (×2): 100 ug via ORAL
  Filled 2020-11-04 (×2): qty 1

## 2020-11-04 MED ORDER — LIDOCAINE HCL 2 % IJ SOLN
INTRAMUSCULAR | Status: AC
  Filled 2020-11-04: qty 20

## 2020-11-04 MED ORDER — LACTATED RINGERS IV BOLUS: 1000.0000 mL | Freq: Three times a day (TID) | INTRAVENOUS | Status: DC | PRN

## 2020-11-04 MED ORDER — LIP MEDEX EX OINT
1.0000 "application " | TOPICAL_OINTMENT | Freq: Two times a day (BID) | CUTANEOUS | Status: DC
  Administered 2020-11-04 – 2020-11-06 (×4): 1 via TOPICAL
  Filled 2020-11-04: qty 7

## 2020-11-04 MED ORDER — ACETAMINOPHEN 500 MG PO TABS
1000.0000 mg | ORAL_TABLET | ORAL | Status: AC
  Administered 2020-11-04: 1000 mg via ORAL
  Filled 2020-11-04: qty 2

## 2020-11-04 MED ORDER — MAGNESIUM HYDROXIDE 400 MG/5ML PO SUSP: 30.0000 mL | Freq: Every day | ORAL | Status: DC | PRN

## 2020-11-04 MED ORDER — ENSURE PRE-SURGERY PO LIQD
592.0000 mL | Freq: Once | ORAL | Status: DC
  Filled 2020-11-04: qty 592

## 2020-11-04 MED ORDER — LACTATED RINGERS IV SOLN: INTRAVENOUS | Status: DC | PRN

## 2020-11-04 MED ORDER — EPHEDRINE SULFATE-NACL 50-0.9 MG/10ML-% IV SOSY
PREFILLED_SYRINGE | INTRAVENOUS | Status: DC | PRN
  Administered 2020-11-04: 5 mg via INTRAVENOUS
  Administered 2020-11-04 (×3): 10 mg via INTRAVENOUS
  Administered 2020-11-04 (×2): 5 mg via INTRAVENOUS

## 2020-11-04 MED ORDER — LIDOCAINE 20MG/ML (2%) 15 ML SYRINGE OPTIME
INTRAMUSCULAR | Status: DC | PRN
  Administered 2020-11-04: 1.5 mg/kg/h via INTRAVENOUS

## 2020-11-04 MED ORDER — MIDAZOLAM HCL 2 MG/2ML IJ SOLN
INTRAMUSCULAR | Status: AC
  Filled 2020-11-04: qty 2

## 2020-11-04 MED ORDER — 0.9 % SODIUM CHLORIDE (POUR BTL) OPTIME
TOPICAL | Status: DC | PRN
  Administered 2020-11-04: 1000 mL

## 2020-11-04 MED ORDER — OXYCODONE HCL 5 MG/5ML PO SOLN: 5.0000 mg | Freq: Once | ORAL | Status: DC | PRN

## 2020-11-04 MED ORDER — DIPHENHYDRAMINE HCL 50 MG/ML IJ SOLN: 12.5000 mg | Freq: Four times a day (QID) | INTRAMUSCULAR | Status: DC | PRN

## 2020-11-04 MED ORDER — ROCURONIUM BROMIDE 10 MG/ML (PF) SYRINGE
PREFILLED_SYRINGE | INTRAVENOUS | Status: AC
  Filled 2020-11-04: qty 20

## 2020-11-04 MED ORDER — MAGIC MOUTHWASH
15.0000 mL | Freq: Four times a day (QID) | ORAL | Status: DC | PRN
  Filled 2020-11-04: qty 15

## 2020-11-04 MED ORDER — PHENYLEPHRINE HCL-NACL 10-0.9 MG/250ML-% IV SOLN
INTRAVENOUS | Status: AC
  Filled 2020-11-04: qty 250

## 2020-11-04 MED ORDER — BUPIVACAINE-EPINEPHRINE 0.25% -1:200000 IJ SOLN
INTRAMUSCULAR | Status: DC | PRN
  Administered 2020-11-04: 60 mL

## 2020-11-04 MED ORDER — LACTATED RINGERS IR SOLN
Status: DC | PRN
  Administered 2020-11-04: 1000 mL

## 2020-11-04 MED ORDER — FLUTICASONE PROPIONATE 50 MCG/ACT NA SUSP
1.0000 | Freq: Every day | NASAL | Status: DC | PRN
  Filled 2020-11-04: qty 16

## 2020-11-04 MED ORDER — FENTANYL CITRATE (PF) 250 MCG/5ML IJ SOLN
INTRAMUSCULAR | Status: AC
  Filled 2020-11-04: qty 5

## 2020-11-04 MED ORDER — DEXAMETHASONE SODIUM PHOSPHATE 4 MG/ML IJ SOLN: 4.0000 mg | INTRAMUSCULAR | Status: DC

## 2020-11-04 MED ORDER — BISACODYL 10 MG RE SUPP
10.0000 mg | Freq: Every day | RECTAL | Status: DC | PRN
Start: 2020-11-04 — End: 2020-11-06
  Administered 2020-11-06: 10 mg via RECTAL
  Filled 2020-11-04: qty 1

## 2020-11-04 MED ORDER — SODIUM CHLORIDE 0.9 % IV SOLN: Freq: Three times a day (TID) | INTRAVENOUS | Status: DC | PRN

## 2020-11-04 MED ORDER — FENTANYL CITRATE (PF) 100 MCG/2ML IJ SOLN
INTRAMUSCULAR | Status: AC
  Filled 2020-11-04: qty 2

## 2020-11-04 MED ORDER — ORAL CARE MOUTH RINSE: 15.0000 mL | Freq: Once | OROMUCOSAL | Status: DC

## 2020-11-04 MED ORDER — BUPIVACAINE LIPOSOME 1.3 % IJ SUSP
INTRAMUSCULAR | Status: DC | PRN
  Administered 2020-11-04: 20 mL

## 2020-11-04 MED ORDER — EPHEDRINE 5 MG/ML INJ
INTRAVENOUS | Status: AC
  Filled 2020-11-04: qty 10

## 2020-11-04 MED ORDER — LACTATED RINGERS IV SOLN: INTRAVENOUS | Status: DC

## 2020-11-04 MED ORDER — SODIUM CHLORIDE 0.9% FLUSH: 3.0000 mL | INTRAVENOUS | Status: DC | PRN

## 2020-11-04 MED ORDER — ALBUTEROL SULFATE HFA 108 (90 BASE) MCG/ACT IN AERS
1.0000 | INHALATION_SPRAY | Freq: Four times a day (QID) | RESPIRATORY_TRACT | Status: DC | PRN
  Filled 2020-11-04: qty 6.7

## 2020-11-04 SURGICAL SUPPLY — 71 items
APPLICATOR COTTON TIP 6 STRL (MISCELLANEOUS) IMPLANT
APPLICATOR COTTON TIP 6IN STRL (MISCELLANEOUS)
APPLIER CLIP 5 13 M/L LIGAMAX5 (MISCELLANEOUS)
BLADE SURG SZ11 CARB STEEL (BLADE) ×3 IMPLANT
CANNULA REDUC XI 12-8 STAPL (CANNULA) ×1
CANNULA REDUCER 12-8 DVNC XI (CANNULA) ×2 IMPLANT
CHLORAPREP W/TINT 26 (MISCELLANEOUS) ×3 IMPLANT
CLIP APPLIE 5 13 M/L LIGAMAX5 (MISCELLANEOUS) IMPLANT
CLIP VESOLOCK LG 6/CT PURPLE (CLIP) ×6 IMPLANT
COVER SURGICAL LIGHT HANDLE (MISCELLANEOUS) ×3 IMPLANT
COVER TIP SHEARS 8 DVNC (MISCELLANEOUS) ×2 IMPLANT
COVER TIP SHEARS 8MM DA VINCI (MISCELLANEOUS) ×1
COVER WAND RF STERILE (DRAPES) ×3 IMPLANT
DECANTER SPIKE VIAL GLASS SM (MISCELLANEOUS) ×6 IMPLANT
DEVICE TROCAR PUNCTURE CLOSURE (ENDOMECHANICALS) ×3 IMPLANT
DRAIN CHANNEL 19F RND (DRAIN) ×3 IMPLANT
DRAIN PENROSE 0.5X18 (DRAIN) IMPLANT
DRAPE ARM DVNC X/XI (DISPOSABLE) ×8 IMPLANT
DRAPE COLUMN DVNC XI (DISPOSABLE) ×2 IMPLANT
DRAPE DA VINCI XI ARM (DISPOSABLE) ×4
DRAPE DA VINCI XI COLUMN (DISPOSABLE) ×1
DRAPE WARM FLUID 44X44 (DRAPES) ×3 IMPLANT
DRSG TEGADERM 2-3/8X2-3/4 SM (GAUZE/BANDAGES/DRESSINGS) ×15 IMPLANT
DRSG TEGADERM 4X4.75 (GAUZE/BANDAGES/DRESSINGS) ×3 IMPLANT
ELECT REM PT RETURN 15FT ADLT (MISCELLANEOUS) ×3 IMPLANT
ENDOLOOP SUT PDS II  0 18 (SUTURE)
ENDOLOOP SUT PDS II 0 18 (SUTURE) IMPLANT
EVACUATOR SILICONE 100CC (DRAIN) ×3 IMPLANT
FELT TEFLON 4 X1 (Mesh General) ×3 IMPLANT
GAUZE SPONGE 2X2 8PLY STRL LF (GAUZE/BANDAGES/DRESSINGS) ×2 IMPLANT
GLOVE ECLIPSE 8.0 STRL XLNG CF (GLOVE) ×6 IMPLANT
GLOVE INDICATOR 8.0 STRL GRN (GLOVE) ×6 IMPLANT
GOWN STRL REUS W/TWL XL LVL3 (GOWN DISPOSABLE) ×9 IMPLANT
GRASPER SUT TROCAR 14GX15 (MISCELLANEOUS) IMPLANT
IRRIG SUCT STRYKERFLOW 2 WTIP (MISCELLANEOUS) ×3
IRRIGATION SUCT STRKRFLW 2 WTP (MISCELLANEOUS) ×2 IMPLANT
KIT BASIN OR (CUSTOM PROCEDURE TRAY) ×3 IMPLANT
KIT TURNOVER KIT A (KITS) IMPLANT
MESH PHASIX RESORB RECT 10X15 (Mesh General) ×3 IMPLANT
NEEDLE HYPO 22GX1.5 SAFETY (NEEDLE) ×3 IMPLANT
NEEDLE INSUFFLATION 14GA 120MM (NEEDLE) ×3 IMPLANT
PACK CARDIOVASCULAR III (CUSTOM PROCEDURE TRAY) ×3 IMPLANT
PAD POSITIONING PINK XL (MISCELLANEOUS) ×3 IMPLANT
RTRCTR WOUND ALEXIS 18CM SML (INSTRUMENTS) ×3
SAVER CELL AAL HAEMONETICS (INSTRUMENTS) ×2 IMPLANT
SCISSORS LAP 5X45 EPIX DISP (ENDOMECHANICALS) ×3 IMPLANT
SEAL CANN UNIV 5-8 DVNC XI (MISCELLANEOUS) ×8 IMPLANT
SEAL XI 5MM-8MM UNIVERSAL (MISCELLANEOUS) ×4
SEALER VESSEL DA VINCI XI (MISCELLANEOUS) ×1
SEALER VESSEL EXT DVNC XI (MISCELLANEOUS) ×2 IMPLANT
SOLUTION ELECTROLUBE (MISCELLANEOUS) ×3 IMPLANT
SPONGE GAUZE 2X2 STER 10/PKG (GAUZE/BANDAGES/DRESSINGS) ×1
SPONGE LAP 18X18 RF (DISPOSABLE) ×3 IMPLANT
STAPLER CANNULA SEAL DVNC XI (STAPLE) ×2 IMPLANT
STAPLER CANNULA SEAL XI (STAPLE) ×1
STOPCOCK 4 WAY LG BORE MALE ST (IV SETS) ×6 IMPLANT
SUT ETHIBOND 0 36 GRN (SUTURE) ×9 IMPLANT
SUT ETHIBOND NAB CT1 #1 30IN (SUTURE) ×9 IMPLANT
SUT MNCRL AB 4-0 PS2 18 (SUTURE) ×6 IMPLANT
SUT PDS AB 1 CT1 27 (SUTURE) ×3 IMPLANT
SUT PROLENE 2 0 SH DA (SUTURE) ×3 IMPLANT
SUT VICRYL 0 TIES 12 18 (SUTURE) IMPLANT
SUT VICRYL 0 UR6 27IN ABS (SUTURE) IMPLANT
SUT VLOC 180 2-0 9IN GS21 (SUTURE) ×6 IMPLANT
SYR 10ML LL (SYRINGE) ×3 IMPLANT
SYR 20ML LL LF (SYRINGE) ×3 IMPLANT
TOWEL OR 17X26 10 PK STRL BLUE (TOWEL DISPOSABLE) ×3 IMPLANT
TOWEL OR NON WOVEN STRL DISP B (DISPOSABLE) ×3 IMPLANT
TRAY FOLEY MTR SLVR 14FR STAT (SET/KITS/TRAYS/PACK) ×3 IMPLANT
TROCAR ADV FIXATION 5X100MM (TROCAR) ×3 IMPLANT
TUBING INSUFFLATION 10FT LAP (TUBING) ×3 IMPLANT

## 2020-11-04 NOTE — Anesthesia Procedure Notes (Deleted)
Procedure Name: Intubation Date/Time: 11/04/2020 8:00 AM Performed by: Lavina Hamman, CRNA Pre-anesthesia Checklist: Patient identified, Emergency Drugs available, Suction available and Patient being monitored Patient Re-evaluated:Patient Re-evaluated prior to induction Oxygen Delivery Method: Circle System Utilized Preoxygenation: Pre-oxygenation with 100% oxygen Induction Type: IV induction Ventilation: Mask ventilation without difficulty Laryngoscope Size: Mac and 3 Grade View: Grade III Tube type: Oral Number of attempts: 1 Airway Equipment and Method: Stylet and Oral airway Placement Confirmation: ETT inserted through vocal cords under direct vision,  positive ETCO2 and breath sounds checked- equal and bilateral Tube secured with: Tape Dental Injury: Teeth and Oropharynx as per pre-operative assessment

## 2020-11-04 NOTE — Anesthesia Procedure Notes (Signed)
Performed by: Lavina Hamman, CRNA

## 2020-11-04 NOTE — Anesthesia Postprocedure Evaluation (Signed)
Anesthesia Post Note  Patient: Annette Frank  Procedure(s) Performed: ROBOTIC REPAIR OF PARAESOPHAGEAL HIATAL HERNIA WITH TOUPET FUNDOPLICATION WITH MESH, ROBOTIC CHOLECYSTECTOMY, PARTIAL OMENTECTOMY, BILATERAL TAP BLOCK (N/A Abdomen)     Patient location during evaluation: PACU Anesthesia Type: General Level of consciousness: awake and alert Pain management: pain level controlled Vital Signs Assessment: post-procedure vital signs reviewed and stable Respiratory status: spontaneous breathing, nonlabored ventilation, respiratory function stable and patient connected to nasal cannula oxygen Cardiovascular status: blood pressure returned to baseline and stable Postop Assessment: no apparent nausea or vomiting Anesthetic complications: no   No complications documented.  Last Vitals:  Vitals:   11/04/20 1330 11/04/20 1357  BP: (!) 168/82 (!) 157/85  Pulse: 80 80  Resp: 18 16  Temp: 36.7 C   SpO2: 97% 98%    Last Pain:  Vitals:   11/04/20 1400  TempSrc:   PainSc: [place] Latecia Miler

## 2020-11-04 NOTE — Transfer of Care (Signed)
Immediate Anesthesia Transfer of Care Note  Patient: Elli Groesbeck  Procedure(s) Performed: ROBOTIC REPAIR OF PARAESOPHAGEAL HIATAL HERNIA WITH TOUPET FUNDOPLICATION WITH MESH, ROBOTIC CHOLECYSTECTOMY, PARTIAL OMENTECTOMY, BILATERAL TAP BLOCK (N/A Abdomen)  Patient Location: PACU  Anesthesia Type:General  Level of Consciousness: awake, alert  and oriented  Airway & Oxygen Therapy: Patient connected to face mask oxygen  Post-op Assessment: Report given to RN, Post -op Vital signs reviewed and stable, Patient moving all extremities X 4 and Patient able to stick tongue midline  Post vital signs: stable  Last Vitals:  Vitals Value Taken Time  BP 149/82 11/04/20 1216  Temp    Pulse 78 11/04/20 1223  Resp 19 11/04/20 1223  SpO2 100 % 11/04/20 1223  Vitals shown include unvalidated device data.  Last Pain:  Vitals:   11/04/20 0552  TempSrc:   PainSc: 3          Complications: No complications documented.

## 2020-11-04 NOTE — Op Note (Addendum)
11/04/2020  12:25 PM  PATIENT:  Annette Frank  70 y.o. 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 An experienced assistant was required given the standard of surgical care given the complexity of the case.  This assistant was needed for exposure, dissection, suctioning, retraction, instrument exchange, etc.  ANESTHESIA:   local and general   Nerve block provided with liposomal bupivacaine (Experel) mixed with 0.25% bupivacaine as a Bilateral TAP block x 48mL each side at the level of the transverse abdominis & preperitoneal spaces & subcostal ridge along the flank at the anterior axillary line, from subcostal ridge to iliac crest under laparoscopic guidance    EBL:  Total I/O In: 1900 [I.V.:1900] Out: 275 [Urine:250; Blood:25]  Delay start of Pharmacological VTE agent (>24hrs) due to surgical blood loss or risk of bleeding:  no  ANESTHESIA: 1. General anesthesia. 2. Local anesthetic in a field block around all port sites.  SPECIMEN:   GALLBLADDER HERNIA SAC OMENTUM  DRAINS:  A 19-French Blake drain goes from the right upper quadrant along the lesser curvature of the stomach into the mediastinum.  COUNTS:  YES  PLAN OF CARE: Admit to inpatient    PATIENT DISPOSITION:  PACU - hemodynamically stable.  INDICATION:   Patient with symptomatic paraesophageal hiatal hernia.  The patient has had extensive work-up & we feel the patient will benefit from repair:  The anatomy & physiology of the foregut and anti-reflux mechanism was discussed.  The pathophysiology of hiatal herniation and GERD was discussed.  Natural history risks without surgery was discussed.   The patient's symptoms are not adequately controlled by medicines and other non-operative treatments.  I feel the risks of no intervention will lead to serious problems that outweigh the operative risks; therefore, I recommended surgery to reduce the hiatal hernia out of the chest and fundoplication to rebuild the anti-reflux valve and control reflux better.  Need for a thorough workup to rule out the differential diagnosis and plan treatment was explained.  I explained laparoscopic techniques with possible need for an open approach.  Risks such as bleeding, infection, abscess, leak, need for further treatment, heart attack, death, and other risks were discussed.   I noted a good likelihood this will help address the problem.  Goals of post-operative recovery were discussed as well.  Possibility that this will not correct all symptoms was explained.  Post-operative dysphagia, need for short-term liquid & pureed diet, inability to vomit, possibility of reherniation, possible need for medicines to help control symptoms in addition to surgery were discussed.  We will work to minimize complications.   Educational handouts further explaining the pathology, treatment options, and dysphagia diet was given as well.  Questions were answered.  The patient expresses understanding & wishes to proceed with surgery.  Patient also had evidence of biliary colic with postprandial nausea and right upper quadrant pain suspicious for gallbladder attacks.  I offered cholecystectomy at the  same time.  She was  interested in proceeding.  The anatomy & physiology of hepatobiliary & pancreatic function was discussed.  The pathophysiology of gallbladder dysfunction was discussed.  Natural history risks without surgery was discussed.   I feel the risks of no intervention will lead to serious problems that outweigh the operative risks; therefore, I recommended cholecystectomy to remove the pathology.  I explained laparoscopic techniques with possible need for an open approach.  Probable cholangiogram to evaluate the bilary tract was explained as well.    Risks such as bleeding, infection, diarrhea and other bowel changes, abscess, leak, injury to other organs, need for repair of tissues / organs, need for further treatment, stroke, heart attack, death, and other risks were discussed.  I noted a good likelihood this will help address the problem, but there is a chance it may not help.  Possibility that this will not correct all abdominal symptoms was explained.  Goals of post-operative recovery were discussed as well.  We will work to minimize complications.  An educational handout further explaining the pathology and treatment options was given as well.  Questions were answered.  The patient expresses understanding & wishes to proceed with surgery.    OR FINDINGS:   Large paraesophageal hiatal hernia with 70% of the stomach in the mediastinum.  There was a 11 x 7 cm hiatal defect.  Bilateral crural releases done.  Right greater than left.  Fair, thin right crural tissue quality.  It is a primary repair over pledgets. Mesh reinforcement was used with Mesh was used: PhasixT Mesh (a knitted monofilament mesh scaffold using Poly-4-hydroxybutyrate (P4HB), a biologically derived, fully resorbable material)  The patient has a 4 cm long Toupet (partial posterior [id number] degree) fundoplication.  The patient has had anterior and posterior gastropexy.  Patient had thinned out gallbladder turgid and swollen with some chronic  changes consistent with chronic cholecystitis.  Fatty change in the liver but no frank cirrhosis  DESCRIPTION:   Informed consent was confirmed.  The patient received IV antibiotics prior to incision.  The underwent general anesthesia without difficulty.  A Foley catheter sterilely placed.  The patient was positioned in split leg with arms tucked. The abdomen was prepped and draped in the sterile fashion.  Surgical time-out confirmed our plan.  I placed a 5 mm port in the left subcostal region using Varess entry technique with the patient in steep reverse Trendelenburg and left side up.  Entry was clean.  We induced carbon dioxide insufflation.  Camera inspection revealed no injury.  Under direct visualization, I placed extra ports.  I did have to do some laparoscopic lysed adhesions since there was moderate amount omentum in the upper abdomen.  She also had some infraumbilically from her prior hernia repair.  I freed adhesions to just below the umbilicus but left the infraumbilical ones in place since they were omentum only and were not going to be in the field of dissection.  I also placed a 5 mm port in the left subxiphoid region under direct visualization.  I removed that and placed an Omega-shaped rigid Nathanson liver retractor to lift the left lateral sector of the liver anteriorly to expose the esophageal hiatus.  The liver did have fatty change and was enlarged but soft.  The liver retractor was secured to the bed using the iron man system.  The Xi robot was carefully docked and instruments placed and advanced under direct visualization.  We focused on dissection.  We grasped the anterior  mediastinal sac at the apex of the crus.  I scored through that and got into the anterior mediastinum.  I was able to free the mediastinal sac from its attachments to the pericardium and bilateral pleura using primarily focused gentle blunt dissection as well as focused vessel sealer dissection.  I transected  phrenoesophageal attachments to the inner right crus, preserving a two centimeter cuff of mediastinal sac until I found the base of the crura.  I then came around anteriorly on the left side and freed up the phrenoesophageal attachments of the mediastinal sac on the medial part of the left crus on the superior half.  I did careful mediastinal dissection to free the mediastinal sac.  With that, we could relieve the suction cup affect of the hernia sac and help reduce the stomach back down into the abdomen, flipped back approriately.    We ligated the short gastrics along the lesser curvature of the stomach about a third the way down and then came up proximally over the fundus.  We released the attachments of the stomach to the retroperitoneum until we were able to connect with the prior dissection on the left crus.  We completed the release of phrenoesophageal attachments to the medial part of the left crus down to its base.  With this, we had circumferential mobilization.  Patient had thick fatty hernia sacs as well as a moderate amount of omentum that had been up in the anterior to the sac.  I carefully skeletonized and transected in the anterior and posterior hernia sacs as well as omentum and remove those for better visualization of the esophageal hiatus.  We placed the stomach and esophagus on axial tension.  I then did a Type II mediastinal dissection where I freed the esophagus from its attachments to the aorta, spine, pleura, and pericardium using primarily gentle blunt as well as focused ultrasonic dissection.  We saw the anterior & posterior vagus nerves intact.  We preserved it at all times.  I procedded to dissect about 20 cm proximally into the mediastinum.  With that I could straighten out the esophagus and get 4 cm of intra-abdominal length of the esophagus at a best estimation.  I freed the anterior mediastinal sac off the esophagus & stomach.  We saw the anterior vagus nerve and freed the sac  off of the vagus.  I dissected out & removed the fatty epiphrenic pads at the esophagogastric junction. With that, I could better define the esophagogastric junction.  I confirmed the the patient had 5 cm of intra-abdominal esophageal length off tension.  I brought the fundus of the stomach posterior to the esophagus over to the right side.  The wrap was mobile with the classic shoe shine maneuver.  Wrap became together gently.    We reflected the stomach left laterally.  We inspected the esophageal hiatal hernia at the diaphragm and noted it was quite large.  Crura are quite apart.  Would not come together easily without major tension.  So I did a crural release.  I did release on the right crura about 4 cm medially by taking the inferior layer of tenderness diaphragmatic fascia and transecting it parallel to the right crus.  That released several centimeters of tension.  The inferior part of the right crura did split.  I did another release parallel to the left corona on the diaphragm 5 cm lateral.  With that the crura could come together with much less tension.  I closed the esophageal  hiatus using #1 Ethibond stitch using horizontal mattress stitches with pledgets on both sides.  I did that x2 stitches.  I then did another #1 suture without pledgets.  That helped the crura close with a 2 cm gap around the esophagus.  Because of the larger defect, I reinforced the repair using a 15 x 10 cm absorbable Phasix mesh.  We cut out a 2x4 cm part of mesh in the middle third of the mesh such that the mesh had a broad U shape transversely, one tail 5 cm wide & the other 8cm.  We brought the mesh in and laid it over the crural repair, tails anterior over the crura.  I tucked the broader "U" tail of the mesh between the left diaphragm and the spleen, the narrower "U" tail over the right crus.   I secured the mesh using #1 interrupted Ethibonds on the diaphragm just lateral to the inferior vena cava to make sure that the  mesh covered the right crural release and separation as well.  Did that x3.  I then used a 2 OV lock serrated billable suture and ran that from the superior part of the you tail and ran down to secure it to the medial part of the right crus and the posterior hiatal crural closure.  I then secured the left lateral and left superior sides of the broader "U" tail to the left diaphragm band with 2-0 V lock running suture.   I brought the fundus of the stomach behind the esophagus and cardia to set up a fundoplication wrap.  I did a posterior gastropexy by taking of 0 Ethibond stitches to the posterior part of the right side of the wrap and thru the Phasix mesh and crural closure x 3.  I placed a similar stitch on the left anterior side as well.  That way the stomach covered the mesh and protected it from any esophageal exposure.  With the anterior and posterior gastropexy's, stomach laid well for a fundoplication wrap.  Given her advanced age with large repair I decided to go for a partial fundoplication.  Had already done a left superior gastropexy.  I did a right superior gastropexy as well by taking a bite of the esophagus, crus, mesh, and then most superior part of the right side the mesh to tie that down.  That provided a right anterior gastropexy with the stomach between the mesh and the esophagus.  I had already done a similar stitch in the left superior aspect.  I then did 0 Ethibond sutures from the inner part of the wrap to the lateral esophagus on each side.  2 more pairs.  I did an extra stitch on the left side as well between the wrap and the esophagus at the cardia.  This provided a 4 cm long partial posterior toupee fundoplication wrap.  The wrap was soft and floppy.  I could place a long blunt robotic instrument between the esophagus and the anterior hiatus, implying snug but not overly tight closure.  We went ahead and removed all needles and stitches and confirmed an accurate count.  I placed the  transected omentum and hernia sac in the left upper quadrant.  Next we focused on cholecystectomy.  We undocked the robot, rotated the patient right side up, and redocked the robot.  Gallbladder was enlarged and turgid but I was able to grasp it and hold it cephalad.  I freed the peritoneal coverings between the gallbladder and liver on the  anterior medial and posterior lateral aspects.  I was able to find the infundibulum and mobilized that off the gallbladder fossa of the liver and a lateral to medial fashion.  With that I I freed the middle part of the gallbladder off the liver bed.  That helped me place it on lateral tension.  I freed the gallbladder from attachments more superiorly as well such that only a timely cuff was sitting adherent between the gallbladder and liver bed.  With his excellent critical view, I focused on dissecting and skeletonizing the lymphatics as well as the cystic anterior posterior arterial branches off the gallbladder just proximal to the infundibulum since had a good critical view.  This allowed me to skeletonized the infundibulum and cystic duct.  After confirming a critical view I ligated the cystic duct using plastic hemolock clips x2 on the more proximal cystic duct side and one on the infundibulum.  We transected the gallbladder.  Freed the gallbladder from its lateral attachment on the liver.  I did copious irrigation.  We did inspection and noted hemostasis was good.  We turned attention back to the left upper quadrant.  Confirmed the wrap was soft and viable.  Because of the large mediastinal sac/empty space where most of the stomach had been a decided to leave a drain.  [place] drain from a port site and passed that through the diaphragmatic hiatus just superior to the esophagus.  Allowed the rest of it to run along the lesser curve.  Secured at the skin with Prolene suture.  Did irrigation assured hemostasis.  Robotic instruments were removed and the robot was  undocked.  We had been using the left lateral subcostal port site as a 12 mm port to allow needles to pass the mesh to come in and out.  We dilated that slightly and we placed a very small wound protector along the left subcostal port site and removed the gallbladder and omentum and hernia sac under laparoscopic visualization to good result.  We closed the fascial defect using #1 PDS using laparoscopic suture passer x2.  I removed the [facility] liver retractor under direct visualization.  I evacuated carbon dioxide and removed the ports.  The skin was closed with Monocryl and sterile dressings applied.  The patient is being extubated and brought back to the recovery room.  I discussed postop care in detail with the patient and family in in the office.  Discussed again with the patient in the holding area.  I discussed operative findings, updated the patient's status, discussed probable steps to recovery, and gave postoperative recommendations to the patient's spouse, Jeydi Klingel.   Recommendations were made.  Questions were answered.  He expressed understanding & appreciation.  Adin Hector, M.D., F.A.C.S. Gastrointestinal and Minimally Invasive Surgery [facility] [address], [place], [place] [address] [phone] Main / Paging

## 2020-11-04 NOTE — Discharge Instructions (Signed)
EATING AFTER YOUR ESOPHAGEAL SURGERY (Stomach Fundoplication, Hiatal Hernia repair, Achalasia surgery, etc)  ######################################################################  EAT Start with a pureed / full liquid diet (see below) Gradually transition to a high fiber diet with a fiber supplement over the next month after discharge.    WALK Walk an hour a day.  Control your pain to do that.    CONTROL PAIN Control pain so that you can walk, sleep, tolerate sneezing/coughing, go up/down stairs.  HAVE A BOWEL MOVEMENT DAILY Keep your bowels regular to avoid problems.  OK to try a laxative to override constipation.  OK to use an antidairrheal to slow down diarrhea.  Call if not better after 2 tries  CALL IF YOU HAVE PROBLEMS/CONCERNS Call if you are still struggling despite following these instructions. Call if you have concerns not answered by these instructions  ######################################################################   After your esophageal surgery, expect some sticking with swallowing over the next 1-2 months.    If food sticks when you eat, it is called "dysphagia".  This is due to swelling around your esophagus at the wrap & hiatal diaphragm repair.  It will gradually ease off over the next few months.  To help you through this temporary phase, we start you out on a pureed (blenderized) diet.  Your first meal in the hospital was thin liquids.  You should have been given a pureed diet by the time you left the hospital.  We ask patients to stay on a pureed diet for the first 2-3 weeks to avoid anything getting "stuck" near your recent surgery.  Don't be alarmed if your ability to swallow doesn't progress according to this plan.  Everyone is different and some diets can advance more or less quickly.    It is often helpful to crush your medications or split them as they can sometimes stick, especially the first week or so.   Some BASIC RULES to follow  are:  Maintain an upright position whenever eating or drinking.  Take small bites - just a teaspoon size bite at a time.  Eat slowly.  It may also help to eat only one food at a time.  Consider nibbling through smaller, more frequent meals & avoid the urge to eat BIG meals  Do not push through feelings of fullness, nausea, or bloatedness  Do not mix solid foods and liquids in the same mouthful  Try not to "wash foods down" with large gulps of liquids.  Avoid carbonated (bubbly/fizzy) drinks.    Avoid foods that make you feel gassy or bloated.  Start with bland foods first.  Wait on trying greasy, fried, or spicy meals until you are tolerating more bland solids well.  Understand that it will be hard to burp and belch at first.  This gradually improves with time.  Expect to be more gassy/flatulent/bloated initially.  Walking will help your body manage it better.  Consider using medications for bloating that contain simethicone such as  Maalox or Gas-X   Consider crushing her medications, especially smaller pills.  The ability to swallow pills should get easier after a few weeks  Eat in a relaxed atmosphere & minimize distractions.  Avoid talking while eating.    Do not use straws.  Following each meal, sit in an upright position (90 degree angle) for 60 to 90 minutes.  Going for a short walk can help as well  If food does stick, don't panic.  Try to relax and let the food pass on its own.    Sipping WARM LIQUID such as strong hot black tea can also help slide it down.   Be gradual in changes & use common sense:  -If you easily tolerating a certain "level" of foods, advance to the next level gradually -If you are having trouble swallowing a particular food, then avoid it.   -If food is sticking when you advance your diet, go back to thinner previous diet (the lower LEVEL) for 1-2 days.  LEVEL 1 = PUREED DIET  Do for the first 2 WEEKS AFTER SURGERY  -Foods in this group are  pureed or blenderized to a smooth, mashed potato-like consistency.  -If necessary, the pureed foods can keep their shape with the addition of a thickening agent.   -Meat should be pureed to a smooth, pasty consistency.  Hot broth or gravy may be added to the pureed meat, approximately 1 oz. of liquid per 3 oz. serving of meat. -CAUTION:  If any foods do not puree into a smooth consistency, swallowing will be more difficult.  (For example, nuts or seeds sometimes do not blend well.)  Hot Foods Cold Foods  Pureed scrambled eggs and cheese Pureed cottage cheese  Baby cereals Thickened juices and nectars  Thinned cooked cereals (no lumps) Thickened milk or eggnog  Pureed [place] toast or pancakes Ensure  Mashed potatoes Ice cream  Pureed parsley, au gratin, scalloped potatoes, candied sweet potatoes Fruit or [place] ice, sherbet  Pureed buttered or alfredo noodles Plain yogurt  Pureed vegetables (no corn or peas) Instant breakfast  Pureed soups and creamed soups Smooth pudding, mousse, custard  Pureed scalloped apples Whipped gelatin  Gravies Sugar, syrup, honey, jelly  Sauces, cheese, tomato, barbecue, white, creamed Cream  Any baby food Creamer  Alcohol in moderation (not beer or champagne) Margarine  Coffee or tea Mayonnaise   Ketchup, mustard   Apple sauce   SAMPLE MENU:  PUREED DIET Breakfast Lunch Dinner   Orange juice, 1/2 cup  Cream of wheat, 1/2 cup  Pineapple juice, 1/2 cup  Pureed [place], barley soup, 3/4 cup  Pureed Hawaiian chicken, 3 oz   Scrambled eggs, mashed or blended with cheese, 1/2 cup  Tea or coffee, 1 cup   Whole milk, 1 cup   Non-dairy creamer, 2 Tbsp.  Mashed potatoes, 1/2 cup  Pureed cooled broccoli, 1/2 cup  Apple sauce, 1/2 cup  Coffee or tea  Mashed potatoes, 1/2 cup  Pureed spinach, 1/2 cup  Frozen yogurt, 1/2 cup  Tea or coffee      LEVEL 2 = SOFT DIET  After your first 2 weeks, you can advance to a soft diet.   Keep on this  diet until everything goes down easily.  Hot Foods Cold Foods  White fish Cottage cheese  Stuffed fish Junior baby fruit  Baby food meals Semi thickened juices  Minced soft cooked, scrambled, poached eggs nectars  Souffle & omelets Ripe mashed bananas  Cooked cereals Canned fruit, pineapple sauce, milk  potatoes Milkshake  Buttered or Alfredo noodles Custard  Cooked cooled vegetable Puddings, including tapioca  Sherbet Yogurt  Vegetable soup or alphabet soup Fruit ice, [place] ice  Gravies Whipped gelatin  Sugar, syrup, honey, jelly Junior baby desserts  Sauces:  Cheese, creamed, barbecue, tomato, white Cream  Coffee or tea Margarine   SAMPLE MENU:  LEVEL 2 Breakfast Lunch Dinner   Orange juice, 1/2 cup  Oatmeal, 1/2 cup  Scrambled eggs with cheese, 1/2 cup  Decaffeinated tea, 1 cup  Whole milk, 1 cup  Non-dairy creamer, 2 Tbsp  Pineapple juice, 1/2 cup  Minced beef, 3 oz  Gravy, 2 Tbsp  Mashed potatoes, 1/2 cup  Minced fresh broccoli, 1/2 cup  Applesauce, 1/2 cup  Coffee, 1 cup  [place], barley soup, 3/4 cup  Minced Hawaiian chicken, 3 oz  Mashed potatoes, 1/2 cup  Cooked spinach, 1/2 cup  Frozen yogurt, 1/2 cup  Non-dairy creamer, 2 Tbsp      LEVEL 3 = CHOPPED DIET  -After all the foods in level 2 (soft diet) are passing through well you should advance up to more chopped foods.  -It is still important to cut these foods into small pieces and eat slowly.  Hot Foods Cold Foods  Poultry Cottage cheese  Chopped Swedish meatballs Yogurt  Meat salads (ground or flaked meat) Milk  Flaked fish (tuna) Milkshakes  Poached or scrambled eggs Soft, cold, dry cereal  Souffles and omelets Fruit juices or nectars  Cooked cereals Chopped canned fruit  Chopped French toast or pancakes Canned fruit cocktail  Noodles or pasta (no rice) Pudding, mousse, custard  Cooked vegetables (no frozen peas, corn, or mixed vegetables) Green salad  Canned small sweet peas  Ice cream  Creamed soup or vegetable soup Fruit ice, Italian ice  Pureed vegetable soup or alphabet soup Non-dairy creamer  Ground scalloped apples Margarine  Gravies Mayonnaise  Sauces:  Cheese, creamed, barbecue, tomato, white Ketchup  Coffee or tea Mustard   SAMPLE MENU:  LEVEL 3 Breakfast Lunch Dinner   Orange juice, 1/2 cup  Oatmeal, 1/2 cup  Scrambled eggs with cheese, 1/2 cup  Decaffeinated tea, 1 cup  Whole milk, 1 cup  Non-dairy creamer, 2 Tbsp  Ketchup, 1 Tbsp  Margarine, 1 tsp  Salt, 1/4 tsp  Sugar, 2 tsp  Pineapple juice, 1/2 cup  Ground beef, 3 oz  Gravy, 2 Tbsp  Mashed potatoes, 1/2 cup  Cooked spinach, 1/2 cup  Applesauce, 1/2 cup  Decaffeinated coffee  Whole milk  Non-dairy creamer, 2 Tbsp  Margarine, 1 tsp  Salt, 1/4 tsp  Pureed turkey, barley soup, 3/4 cup  Barbecue chicken, 3 oz  Mashed potatoes, 1/2 cup  Ground fresh broccoli, 1/2 cup  Frozen yogurt, 1/2 cup  Decaffeinated tea, 1 cup  Non-dairy creamer, 2 Tbsp  Margarine, 1 tsp  Salt, 1/4 tsp  Sugar, 1 tsp    LEVEL 4:  REGULAR FOODS  -Foods in this group are soft, moist, regularly textured foods.   -This level includes meat and breads, which tend to be the hardest things to swallow.   -Eat very slowly, chew well and continue to avoid carbonated drinks. -most people are at this level in 4-6 weeks  Hot Foods Cold Foods  Baked fish or skinned Soft cheeses - cottage cheese  Souffles and omelets Cream cheese  Eggs Yogurt  Stuffed shells Milk  Spaghetti with meat sauce Milkshakes  Cooked cereal Cold dry cereals (no nuts, dried fruit, coconut)  French toast or pancakes Crackers  Buttered toast Fruit juices or nectars  Noodles or pasta (no rice) Canned fruit  Potatoes (all types) Ripe bananas  Soft, cooked vegetables (no corn, lima, or baked beans) Peeled, ripe, fresh fruit  Creamed soups or vegetable soup Cakes (no nuts, dried fruit, coconut)  Canned chicken  noodle soup Plain doughnuts  Gravies Ice cream  Bacon dressing Pudding, mousse, custard  Sauces:  Cheese, creamed, barbecue, tomato, white Fruit ice, Italian ice, sherbet  Decaffeinated tea or coffee Whipped gelatin  Pork chops Regular gelatin     Canned fruited gelatin molds   Sugar, syrup, honey, jam, jelly   Cream   Non-dairy   Margarine   Oil   Mayonnaise   Ketchup   Mustard   TROUBLESHOOTING IRREGULAR BOWELS  1) Avoid extremes of bowel movements (no bad constipation/diarrhea)  2) Miralax 17gm mixed in 8oz. water or juice-daily. May use BID as needed.  3) Gas-x,Phazyme, etc. as needed for gas & bloating.  4) Soft,bland diet. No spicy,greasy,fried foods.  5) Prilosec over-the-counter as needed  6) May hold gluten/wheat products from diet to see if symptoms improve.  7) May try probiotics (Align, Activa, etc) to help calm the bowels down  7) If symptoms become worse call back immediately.    If you have any questions please call our office at [facility]: [phone].   LAPAROSCOPIC SURGERY: POST OP INSTRUCTIONS  ######################################################################  EAT Gradually transition to a high fiber diet with a fiber supplement over the next few weeks after discharge.  Start with a pureed / full liquid diet (see below)  WALK Walk an hour a day.  Control your pain to do that.    CONTROL PAIN Control pain so that you can walk, sleep, tolerate sneezing/coughing, go up/down stairs.  HAVE A BOWEL MOVEMENT DAILY Keep your bowels regular to avoid problems.  OK to try a laxative to override constipation.  OK to use an antidairrheal to slow down diarrhea.  Call if not better after 2 tries  CALL IF YOU HAVE PROBLEMS/CONCERNS Call if you are still struggling despite following these instructions. Call if you have concerns not answered by these  instructions  ######################################################################    1. DIET: Follow a light bland diet & liquids the first 24 hours after arrival home, such as soup, liquids, starches, etc.  Be sure to drink plenty of fluids.  Quickly advance to a usual solid diet within a few days.  Avoid fast food or heavy meals as your are more likely to get nauseated or have irregular bowels.  A low-fat, high-fiber diet for the rest of your life is ideal.  2. Take your usually prescribed home medications unless otherwise directed.  3. PAIN CONTROL: a. Pain is best controlled by a usual combination of three different methods TOGETHER: i. Ice/Heat ii. Over the counter pain medication iii. Prescription pain medication b. Most patients will experience some swelling and bruising around the incisions.  Ice packs or heating pads (30-60 minutes up to 6 times a day) will help. Use ice for the first few days to help decrease swelling and bruising, then switch to heat to help relax tight/sore spots and speed recovery.  Some people prefer to use ice alone, heat alone, alternating between ice & heat.  Experiment to what works for you.  Swelling and bruising can take several weeks to resolve.   c. It is helpful to take an over-the-counter pain medication regularly for the first few weeks.  Choose one of the following that works best for you: i. Naproxen (Aleve, etc)  Two 220mg tabs twice a day ii. Ibuprofen (Advil, etc) Three 200mg tabs four times a day (every meal & bedtime) iii. Acetaminophen (Tylenol, etc) 500-650mg four times a day (every meal & bedtime) d. A  prescription for pain medication (such as oxycodone, hydrocodone, tramadol, gabapentin, methocarbamol, etc) should be given to you upon discharge.  Take your pain medication as prescribed.  i. If you are having problems/concerns with the prescription medicine (does not control pain, nausea, vomiting, rash, itching, etc), please   call us [phone] to see if we need to switch you to a different pain medicine that will work better for you and/or control your side effect better. ii. If you need a refill on your pain medication, please give us 48 hour notice.  contact your pharmacy.  They will contact our office to request authorization. Prescriptions will not be filled after 5 pm or on week-ends  4. Avoid getting constipated.   a. Between the surgery and the pain medications, it is common to experience some constipation.   b. Increasing fluid intake and taking a fiber supplement (such as Metamucil, Citrucel, FiberCon, MiraLax, etc) 1-2 times a day regularly will usually help prevent this problem from occurring.   c. A mild laxative (prune juice, Milk of Magnesia, MiraLax, etc) should be taken according to package directions if there are no bowel movements after 48 hours.   5. Watch out for diarrhea.   a. If you have many loose bowel movements, simplify your diet to bland foods & liquids for a few days.   b. Stop any stool softeners and decrease your fiber supplement.   c. Switching to mild anti-diarrheal medications (Kayopectate, Pepto Bismol) can help.   d. If this worsens or does not improve, please call us.  6. Wash / shower every day.  You may shower over the dressings as they are waterproof.  Continue to shower over incision(s) after the dressing is off.  7. Remove your waterproof bandages 3 days after surgery.  You may leave the incision open to air.  You may replace a dressing/Band-Aid to cover the incision for comfort if you wish.   8. ACTIVITIES as tolerated:   i. You may resume regular (light) daily activities beginning the next day--such as daily self-care, walking, climbing stairs--gradually increasing activities as tolerated.  If you can walk 30 minutes without difficulty, it is safe to try more intense activity such as jogging, treadmill, bicycling, low-impact aerobics, swimming, etc. ii. Save the most intensive and  strenuous activity for last such as sit-ups, heavy lifting, contact sports, etc  Refrain from any heavy lifting or straining until you are off narcotics for pain control.   iii. DO NOT PUSH THROUGH PAIN.  Let pain be your guide: If it hurts to do something, don't do it.  Pain is your body warning you to avoid that activity for another week until the pain goes down. iv. You may drive when you are no longer taking prescription pain medication, you can comfortably wear a seatbelt, and you can safely maneuver your car and apply brakes. v. You may have sexual intercourse when it is comfortable.  9. FOLLOW UP in our office a. Please call [facility] at [phone] to set up an appointment to see your surgeon in the office for a follow-up appointment approximately 2-3 weeks after your surgery. b. Make sure that you call for this appointment the day you arrive home to insure a convenient appointment time.  10. IF YOU HAVE DISABILITY OR FAMILY LEAVE FORMS, BRING THEM TO THE OFFICE FOR PROCESSING.  DO NOT GIVE THEM TO YOUR DOCTOR.   WHEN TO CALL US [phone]: 1. Poor pain control 2. Reactions / problems with new medications (rash/itching, nausea, etc)  3. Fever over 101.5 F (38.5 C) 4. Inability to urinate 5. Nausea and/or vomiting 6. Worsening swelling or bruising 7. Continued bleeding from incision. 8. Increased pain, redness, or drainage from the incision   The clinic staff is available to   answer your questions during regular business hours (8:30am-5pm).  Please don't hesitate to call and ask to speak to one of our nurses for clinical concerns.   If you have a medical emergency, go to the nearest emergency room or call 911.  A surgeon from [facility] is always on call at the hospitals   [facility] [address], [address], [place], [place]  [address] ? MAIN: [phone] ? TOLL FREE: [phone] ?  FAX [phone] [email or web address]   

## 2020-11-04 NOTE — Interval H&P Note (Signed)
History and Physical Interval Note:  11/04/2020 7:11 AM  Annette Frank  has presented today for surgery, with the diagnosis of PARAESOPHAGEAL HIATAL HERNIA.  The various methods of treatment have been discussed with the patient and family. After consideration of risks, benefits and other options for treatment, the patient has consented to  Procedure(s): ROBOTIC REPAIR OF PARAESOPHAGEAL HIATAL HERNIA WITH FUNDOPLICATION,ROBOTIC CHOLECYSTGECTOMY NO IOC (N/A) POSSIBLE ESOPHAGOGASTRODUODENOSCOPY (EGD) (N/A) as a surgical intervention.  The patient's history has been reviewed, patient examined, no change in status, stable for surgery.  I have reviewed the patient's chart and labs.  Questions were answered to the patient's satisfaction.     Adin Hector

## 2020-11-05 ENCOUNTER — Inpatient Hospital Stay (HOSPITAL_COMMUNITY): Payer: Medicare Other

## 2020-11-05 LAB — MAGNESIUM: Magnesium: 2.3 mg/dL (ref 1.7–2.4)

## 2020-11-05 LAB — CBC
HCT: 28.5 % — ABNORMAL LOW (ref 36.0–46.0)
Hemoglobin: 7.9 g/dL — ABNORMAL LOW (ref 12.0–15.0)
MCH: 21.2 pg — ABNORMAL LOW (ref 26.0–34.0)
MCHC: 27.7 g/dL — ABNORMAL LOW (ref 30.0–36.0)
MCV: 76.6 fL — ABNORMAL LOW (ref 80.0–100.0)
Platelets: 308 10*3/uL (ref 150–400)
RBC: 3.72 MIL/uL — ABNORMAL LOW (ref 3.87–5.11)
RDW: 22 % — ABNORMAL HIGH (ref 11.5–15.5)
WBC: 13.8 10*3/uL — ABNORMAL HIGH (ref 4.0–10.5)
nRBC: 0 % (ref 0.0–0.2)

## 2020-11-05 LAB — BASIC METABOLIC PANEL
Anion gap: 11 (ref 5–15)
BUN: 13 mg/dL (ref 8–23)
CO2: 23 mmol/L (ref 22–32)
Calcium: 9.5 mg/dL (ref 8.9–10.3)
Chloride: 105 mmol/L (ref 98–111)
Creatinine, Ser: 0.88 mg/dL (ref 0.44–1.00)
GFR, Estimated: >60 mL/min (ref >60)
Glucose, Bld: 135 mg/dL — ABNORMAL HIGH (ref 70–99)
Potassium: 4.8 mmol/L (ref 3.5–5.1)
Sodium: 139 mmol/L (ref 135–145)

## 2020-11-05 MED ORDER — SODIUM CHLORIDE 0.9 % IV SOLN
25.0000 mg | Freq: Once | INTRAVENOUS | Status: AC
  Administered 2020-11-05: 25 mg via INTRAVENOUS
  Filled 2020-11-05: qty 0.5

## 2020-11-05 MED ORDER — SODIUM CHLORIDE 0.9 % IV SOLN
8.0000 mg | Freq: Four times a day (QID) | INTRAVENOUS | Status: DC | PRN
  Filled 2020-11-05: qty 4

## 2020-11-05 MED ORDER — METOCLOPRAMIDE HCL 5 MG/ML IJ SOLN: 5.0000 mg | Freq: Three times a day (TID) | INTRAMUSCULAR | Status: DC | PRN

## 2020-11-05 MED ORDER — PROCHLORPERAZINE EDISYLATE 10 MG/2ML IJ SOLN: 5.0000 mg | INTRAMUSCULAR | Status: DC | PRN

## 2020-11-05 MED ORDER — SODIUM CHLORIDE 0.9 % IV SOLN
500.0000 mg | Freq: Once | INTRAVENOUS | Status: AC
  Administered 2020-11-05: 500 mg via INTRAVENOUS
  Filled 2020-11-05: qty 10

## 2020-11-05 MED ORDER — ONDANSETRON HCL 4 MG/2ML IJ SOLN
4.0000 mg | Freq: Four times a day (QID) | INTRAMUSCULAR | Status: DC | PRN
  Administered 2020-11-05: 10:00:00 4 mg via INTRAVENOUS
  Filled 2020-11-05: qty 2

## 2020-11-05 MED ORDER — DIATRIZOATE MEGLUMINE & SODIUM 66-10 % PO SOLN
60.0000 mL | Freq: Once | ORAL | Status: AC
  Administered 2020-11-05: 35 mL via ORAL
  Filled 2020-11-05: qty 60

## 2020-11-05 NOTE — Progress Notes (Signed)
Annette Frank IT:[id number] 1951/03/24  CARE TEAM:  PCP: Ephriam Jenkins Frank  Outpatient Care Team: Patient Care Team: Annette Frank as PCP - General (Nurse Practitioner) Annette Epley, MD as PCP - Electrophysiology (Cardiology) Annette Frank, Annette Quince, MD as Consulting Physician (Gastroenterology) Annette Boston, MD as Consulting Physician (General Surgery)  Inpatient Treatment Team: Treatment Team: Attending Provider: Michael Boston, MD; Utilization Review: Annette Medina, RN; Registered Nurse: Annette Hill, RN; Technician: Annette Frank, [place]   Problem List:   Active Problems:   Incarcerated hiatal hernia   1 Day Post-Op  11/04/2020  POST-OPERATIVE DIAGNOSIS:   PARAESOPHAGEAL HIATAL HERNIA INCARCERATED WITH BLEEDING CHRONIC CHOLECYSYTITIS  PROCEDURE:  ROBOTIC REPAIR OF PARAESOPHAGEAL HIATAL HERNIA WITH MESH REINFORCEMENT TOUPET (PARTIAL [id number]) FUNDOPLICATION ROBOTIC CHOLECYSTECTOMY PARTIAL OMENTECTOMY LYSIS OF ADHESIONS BILATERAL TAP BLOCK  SURGEON:  Annette Hector, MD    Assessment  Annette Frank  [facility]nette [facility] Stay = 1 days)  Plan:  -Gastrografin swallow to assess wrap.  Suspect she is going to be tight at first -If no leak or obstruction, can start liquids and advance to dysphagia 1/pured as tolerated -Nausea control -Pain control -Acute postoperative on top of chronic anemia due to blood loss from [facility] ulcerations.  Give 1 dose of IV iron and follow -VTE prophylaxis- SCDs, etc -mobilize as tolerated to help recovery  Disposition:  Disposition:  The patient is from: Home  Anticipate discharge to:  Home  Anticipated Date of Discharge is:  November 09, 2020  Barriers to discharge:  Pending Clinical improvement (more likely than not  Patient currently is NOT MEDICALLY STABLE for discharge from the hospital from a surgery standpoint.   25 minutes spent in review, evaluation, examination, counseling, and coordination of care.  More than  50% of that time was spent in counseling.  I updated the patient's status to the patient and spouse  Recommendations were made.  Questions were answered.  They expressed understanding & appreciation.   11/05/2020    Subjective: (Chief complaint)  Somewhat nauseated with pills  Husband at bedside  Struggling with soreness last night, but feels better this morning  Objective:  Vital signs:  Vitals:   11/04/20 1728 11/04/20 2059 11/05/20 0151 11/05/20 0549  BP: (!) 161/83 140/65 135/70 (!) 145/70  Pulse: 79 74 75 66  Resp: 18 20 18 18   Temp: 97.6 Frank (36.4 C) (!) 97.3 Frank (36.3 C) 98.3 Frank (36.8 C) 97.9 Frank (36.6 C)  TempSrc: Oral Oral Oral Oral  SpO2: 92% 97% 100% 98%  Weight:      Height:        Last BM Date: 11/04/20  Intake/Output   Yesterday:  01/07 0701 - 01/08 0700 In: [place] [P.O.:1210; I.V.:3006] Out: 995 [Urine:250; Drains:720; Blood:25] This shift:  Total I/O In: -  Out: 25 [Other:25]  Bowel function:  Flatus: YES  BM:  No  Drain: Serosanguinous   Physical Exam:  General: Pt awake/alert in no acute distress.  Tired but not toxic  eyes: PERRL, normal EOM.  Sclera clear.  No icterus Neuro: CN II-XII intact w/o focal sensory/motor deficits. Lymph: No head/neck/groin lymphadenopathy Psych:  No delerium/psychosis/paranoia.  Oriented x 4 HENT: Normocephalic, Mucus membranes moist.  No thrush Neck: Supple, No tracheal deviation.  No obvious thyromegaly Chest: No pain to chest wall compression.  Good respiratory excursion.  No audible wheezing CV:  Pulses intact.  Regular rhythm.  No major extremity edema MS: Normal AROM mjr joints.  No obvious deformity  Abdomen: Soft.  Mildy distended.  Mildly tender at incisions only.  No evidence of peritonitis.  No incarcerated hernias.  Ext:   No deformity.  No mjr edema.  No cyanosis Skin: No petechiae / purpurea.  No major sores.  Warm and dry    Results:   Cultures: Recent Results (from the past 720  hour(s))  SARS CORONAVIRUS 2 (TAT 6-24 HRS) Nasopharyngeal Nasopharyngeal Swab     Status: None   Collection Time: 11/01/20 11:30 AM   Specimen: Nasopharyngeal Swab  Result Value Ref Range Status   SARS Coronavirus 2 NEGATIVE NEGATIVE Final    Comment: (NOTE) SARS-CoV-2 target nucleic acids are NOT DETECTED.  The SARS-CoV-2 RNA is generally detectable in upper and lower respiratory specimens during the acute phase of infection. Negative results do not preclude SARS-CoV-2 infection, do not rule out co-infections with other pathogens, and should not be used as the sole basis for treatment or other patient management decisions. Negative results must be combined with clinical observations, patient history, and epidemiological information. The expected result is Negative.  Fact Sheet for Patients: SugarRoll.be  Fact Sheet for Healthcare Providers: https://www.woods-mathews.com/  This test is not yet approved or cleared by the [place] FDA and  has been authorized for detection and/or diagnosis of SARS-CoV-2 by FDA under an Emergency Use Authorization (EUA). This EUA will remain  in effect (meaning this test can be used) for the duration of the COVID-19 declaration under Se ction 564(b)(1) of the Act, 21 U.S.C. section 360bbb-3(b)(1), unless the authorization is terminated or revoked sooner.  Performed at [facility], [place] [address] [place], [place] [address]     Labs: Results for orders placed or performed during the hospital encounter of 11/04/20 (from the past 48 hour(s))  Type and screen [place]     Status: None   Collection Time: 11/04/20  6:03 AM  Result Value Ref Range   ABO/RH(D) O POS    Antibody Screen NEG    Sample Expiration      11/07/2020,2359 Performed at [facility], [place] [address], [place], [place] [address]   Basic metabolic panel     Status: Abnormal    Collection Time: 11/05/20  5:53 AM  Result Value Ref Range   Sodium 139 135 - 145 mmol/L   Potassium 4.8 3.5 - 5.1 mmol/L   Chloride 105 98 - 111 mmol/L   CO2 23 22 - 32 mmol/L   Glucose, Bld 135 (H) 70 - 99 mg/dL    Comment: Glucose reference range applies only to samples taken after fasting for at least 8 hours.   BUN 13 8 - 23 mg/dL   Creatinine, Ser 0.88 0.44 - 1.00 mg/dL   Calcium 9.5 8.9 - 10.3 mg/dL   GFR, Estimated >60 >60 mL/min    Comment: (NOTE) Calculated using the CKD-EPI Creatinine Equation (2021)    Anion gap 11 5 - 15    Comment: Performed at [facility], [place] [address], [place], [place] [address]  Magnesium     Status: None   Collection Time: 11/05/20  5:53 AM  Result Value Ref Range   Magnesium 2.3 1.7 - 2.4 mg/dL    Comment: Performed at [facility], [place] [address] [place], [place] [address]  CBC     Status: Abnormal   Collection Time: 11/05/20  5:53 AM  Result Value Ref Range   WBC 13.8 (H) 4.0 - 10.5 K/uL   RBC 3.72 (L)  3.87 - 5.11 MIL/uL   Hemoglobin 7.9 (L) 12.0 - 15.0 g/dL    Comment: Reticulocyte Hemoglobin testing may be clinically indicated, consider ordering this additional test [id number]    HCT 28.5 (L) 36.0 - 46.0 %   MCV 76.6 (L) 80.0 - 100.0 fL   MCH 21.2 (L) 26.0 - 34.0 pg   MCHC 27.7 (L) 30.0 - 36.0 g/dL   RDW 22.0 (H) 11.5 - 15.5 %   Platelets 308 150 - 400 K/uL   nRBC 0.0 0.0 - 0.2 %    Comment: Performed at [facility], [place] [address] [place], [place] [address]    Imaging / Studies: No results found.  Medications / Allergies: per chart  Antibiotics: Anti-infectives (From admission, onward)   Start     Dose/Rate Route Frequency Ordered Stop   11/04/20 0600  clindamycin (CLEOCIN) IVPB 900 mg       "And" Linked Group Details   900 mg 100 mL/hr over 30 Minutes Intravenous On call to O.R. 11/03/20 0754 11/04/20 0808   11/04/20 0600  gentamicin (GARAMYCIN) 390 mg in  dextrose 5 % 100 mL IVPB       "And" Linked Group Details   5 mg/kg  77.7 kg (Adjusted) 109.8 mL/hr over 60 Minutes Intravenous On call to O.R. 11/03/20 0754 11/04/20 0750        Note: Portions of this report may have been transcribed using voice recognition software. Every effort was made to ensure accuracy; however, inadvertent computerized transcription errors may be present.   Any transcriptional errors that result from this process are unintentional.    Annette Hector, MD, FACS, MASCRS Gastrointestinal and Minimally Invasive Surgery  [facility] [address], [place], [place] [address] [phone] Fax [phone] Main/Paging  CONTACT INFORMATION: Weekday (9AM-5PM) concerns: Call [facility] main office at [phone] Weeknight (5PM-9AM) or Weekend/Holiday concerns: Check [email or web address] for General Surgery [facility] coverage (Please, do not use SecureChat as it is not reliable communication to operating surgeons for immediate patient care)      11/05/2020  11:04 AM

## 2020-11-05 NOTE — Progress Notes (Signed)
Gastrografin swallow shows no leak or obstruction.  Mildly slow passage.  Moved to unrestricted clears.  Advance to dysphagia 1 pured diet as tolerated.  Possibly discharge tomorrow if meets goals.  We will see.

## 2020-11-06 LAB — POTASSIUM: Potassium: 4.6 mmol/L (ref 3.5–5.1)

## 2020-11-06 LAB — HEMOGLOBIN: Hemoglobin: 8.1 g/dL — ABNORMAL LOW (ref 12.0–15.0)

## 2020-11-06 MED ORDER — OXYCODONE HCL 5 MG PO TABS: 5.0000 mg | ORAL_TABLET | Freq: Four times a day (QID) | ORAL | 0 refills | Status: DC | PRN

## 2020-11-06 MED ORDER — ONDANSETRON HCL 4 MG PO TABS: 4.0000 mg | ORAL_TABLET | Freq: Three times a day (TID) | ORAL | 5 refills | Status: DC | PRN

## 2020-11-06 MED ORDER — FUROSEMIDE 10 MG/ML IJ SOLN
40.0000 mg | Freq: Once | INTRAMUSCULAR | Status: AC
  Administered 2020-11-06: 40 mg via INTRAVENOUS
  Filled 2020-11-06: qty 4

## 2020-11-06 NOTE — Discharge Summary (Signed)
Physician Discharge Summary    Patient ID: Annette Frank MRN: [id number] DOB/AGE: 11/26/1950  70 y.o.  Patient Care Team: Annette Frank as PCP - General (Nurse Practitioner) Annette Epley, MD as PCP - Electrophysiology (Cardiology) Annette Frank, Annette Quince, MD as Consulting Physician (Gastroenterology) Annette Boston, MD as Consulting Physician (General Surgery)  Admit date: 11/04/2020  Discharge date: 11/06/2020  Hospital Stay = 2 days    Discharge Diagnoses:  Principal Problem:   Incarcerated hiatal hernia s/p robotic repair/Toupet fundoplication [id number] Active Problems:   Iron deficiency anemia   GERD (gastroesophageal reflux disease)   Chronic Annette Frank ulcer   Chronic calculous cholecystitis   Obesity (BMI 30-39.9)   DOE (dyspnea on exertion)   Diabetes mellitus ([place])   2 Days Post-Op  11/04/2020   POST-OPERATIVE DIAGNOSIS:   PARAESOPHAGEAL HIATAL HERNIA INCARCERATED WITH BLEEDING CHRONIC CHOLECYSYTITIS  PROCEDURE:  ROBOTIC REPAIR OF PARAESOPHAGEAL HIATAL HERNIA WITH MESH REINFORCEMENT TOUPET (PARTIAL [id number]) FUNDOPLICATION ROBOTIC CHOLECYSTECTOMY PARTIAL OMENTECTOMY LYSIS OF ADHESIONS BILATERAL TAP BLOCK  SURGEON:  Annette Hector, MD  Consults: None  Hospital Course:   Pleasant woman with giant hiatal hernia with Annette Frank ulcerations and symptomatic anemia crying transfusions.  Refractory to medical and acid blockade.  Intermittent pain and dysphagia.  Felt to benefit from hiatal hernia repair.  Also some episodes of right upper quadrant pain and nausea vomiting suspicious for biliary colic.  Offered cholecystectomy.  The patient underwent the surgery above.  Postoperatively, the patient gradually mobilized and advanced to a solid diet.  Pain and other symptoms were treated aggressively.    By the time of discharge, the patient was walking well the hallways, eating pureed food, having flatus.  Pain was well-controlled on an oral medications.   Based on meeting discharge criteria and continuing to recover, I felt it was safe for the patient to be discharged from the hospital to further recover with close followup. Postoperative recommendations were discussed in detail.  They are written as well.  Discharged Condition: good  Discharge Exam: Blood pressure 140/77, pulse 79, temperature 98.6 F (37 C), temperature source Oral, resp. rate 18, height 5\' 6"  (1.676 m), weight 105.3 kg, SpO2 97 %.  General: Pt awake/alert/oriented x4 in No acute distress Eyes: PERRL, normal EOM.  Sclera clear.  No icterus Neuro: CN II-XII intact w/o focal sensory/motor deficits. Lymph: No head/neck/groin lymphadenopathy Psych:  No delerium/psychosis/paranoia HENT: Normocephalic, Mucus membranes moist.  No thrush Neck: Supple, No tracheal deviation Chest: No chest wall pain w good excursion CV:  Pulses intact.  Regular rhythm MS: Normal AROM mjr joints.  No obvious deformity Abdomen: Soft.  Nondistended.  Mildly tender at incisions only.  No evidence of peritonitis.  No incarcerated hernias. Ext:  SCDs BLE.  No mjr edema.  No cyanosis Skin: No petechiae / purpura   Disposition:    Follow-up Information    Annette Boston, MD. Schedule an appointment as soon as possible for a visit in 3 weeks.   Specialty: General Surgery Why: To follow up after your operation, To follow up after your hospital stay Contact information: [place] [phone]               Discharge disposition: 01-Home or Self Care       Discharge Instructions    Call MD for:   Complete by: As directed    Temperature > 101.31F   Call MD for:  extreme fatigue   Complete by: As directed  Call MD for:  hives   Complete by: As directed    Call MD for:  persistant nausea and vomiting   Complete by: As directed    Call MD for:  redness, tenderness, or signs of infection (pain, swelling, redness, odor or green/yellow discharge  around incision site)   Complete by: As directed    Call MD for:  severe uncontrolled pain   Complete by: As directed    Diet general   Complete by: As directed    SEE ESOPHAGEAL SURGERY DIET INSTRUCTIONS  We using usually start you out on a pureed (blenderized) diet. Expect some sticking with swallowing over the next 1-2 months.   This is due to swelling around your esophagus at the wrap & hiatal diaphragm repair.  It will gradually ease off over the next few months.   Discharge instructions   Complete by: As directed    Please see discharge instruction sheets.   Also refer to any handouts/printouts that may have been given from the [facility] surgery office (if you visited us there before surgery) Please call our office if you have any questions or concerns [phone]   Driving Restrictions   Complete by: As directed    No driving until off narcotics and can safely swerve away without pain during an emergency   Increase activity slowly   Complete by: As directed    Lifting restrictions   Complete by: As directed    Avoid heavy lifting initially, <20 pounds at first.   Do not push through pain.   You have no specific weight limit: If it hurts to do, DON'T DO IT.    If you feel no pain, you are not injuring anything.  Pain will protect you from injury.   Coughing and sneezing are far more stressful to your incision than any lifting.   Avoid resuming heavy lifting (>50 pounds) or other intense activity until off all narcotic pain medications.   When want to exercise more, give yourself 2 weeks to gradually get back to full intense exercise/activity.   May shower / Bathe   Complete by: As directed    SHOWER EVERY DAY.  It is fine for dressings or wounds to be washed/rinsed.  Use gentle soap & water.  This will help the incisions and/or wounds get clean & minimize infection.   May walk up steps   Complete by: As directed    Sexual Activity Restrictions   Complete by: As directed     Sexual activity as tolerated.  Do not push through pain.  Pain will protect you from injury.   Walk with assistance   Complete by: As directed    Walk over an hour a day.  May use a walker/cane/companion to help with balance and stamina.      Allergies as of 11/06/2020      Reactions   Cefuroxime Axetil Nausea And Vomiting   Skelaxin [metaxalone] Nausea And Vomiting   Codeine Palpitations   Tincture Of Benzoin [benzoin] Rash      Medication List    TAKE these medications   acetaminophen 500 MG tablet Commonly known as: TYLENOL Take 1,000 mg by mouth every 6 (six) hours as needed for moderate pain.   albuterol 108 (90 Base) MCG/ACT inhaler Commonly known as: VENTOLIN HFA Inhale 1-2 puffs into the lungs every 6 (six) hours as needed for wheezing or shortness of breath.   atenolol 25 MG tablet Commonly known as: TENORMIN Take 25 mg by  mouth daily.   azelastine 0.1 % nasal spray Commonly known as: ASTELIN Place 1 spray into both nostrils at bedtime.   cephALEXin 500 MG capsule Commonly known as: KEFLEX Take 2,000 mg by mouth See admin instructions. Before dental procedures   fluticasone 50 MCG/ACT nasal spray Commonly known as: FLONASE Place 1 spray into both nostrils daily as needed for allergies.   ibuprofen 200 MG tablet Commonly known as: ADVIL Take 400 mg by mouth every 8 (eight) hours as needed for moderate pain.   levothyroxine 100 MCG tablet Commonly known as: SYNTHROID Take 100 mcg by mouth daily before breakfast.   ondansetron 4 MG tablet Commonly known as: ZOFRAN Take 1 tablet (4 mg total) by mouth every 8 (eight) hours as needed for nausea.   oxyCODONE 5 MG immediate release tablet Commonly known as: Oxy IR/ROXICODONE Take 1-2 tablets (5-10 mg total) by mouth every 6 (six) hours as needed for moderate pain, severe pain or breakthrough pain.   Vitamin D (Ergocalciferol) 1.25 MG (50000 UNIT) Caps capsule Commonly known as: DRISDOL Take 50,000 Units by  mouth once a week.       Significant Diagnostic Studies:  Results for orders placed or performed during the hospital encounter of 11/04/20 (from the past 72 hour(s))  Type and screen [place]     Status: None   Collection Time: 11/04/20  6:03 AM  Result Value Ref Range   ABO/RH(D) O POS    Antibody Screen NEG    Sample Expiration      11/07/2020,2359 Performed at [facility], [place] [address], [place], [place] [id number]   Basic metabolic panel     Status: Abnormal   Collection Time: 11/05/20  5:53 AM  Result Value Ref Range   Sodium 139 135 - 145 mmol/L   Potassium 4.8 3.5 - 5.1 mmol/L   Chloride 105 98 - 111 mmol/L   CO2 23 22 - 32 mmol/L   Glucose, Bld 135 (H) 70 - 99 mg/dL    Comment: Glucose reference range applies only to samples taken after fasting for at least 8 hours.   BUN 13 8 - 23 mg/dL   Creatinine, Ser 0.88 0.44 - 1.00 mg/dL   Calcium 9.5 8.9 - 10.3 mg/dL   GFR, Estimated >60 >60 mL/min    Comment: (NOTE) Calculated using the CKD-EPI Creatinine Equation (2021)    Anion gap 11 5 - 15    Comment: Performed at [facility], [place] [address], [place], [place] [address]  Magnesium     Status: None   Collection Time: 11/05/20  5:53 AM  Result Value Ref Range   Magnesium 2.3 1.7 - 2.4 mg/dL    Comment: Performed at [facility], [place] [address], [place], [place] [address]  CBC     Status: Abnormal   Collection Time: 11/05/20  5:53 AM  Result Value Ref Range   WBC 13.8 (H) 4.0 - 10.5 K/uL   RBC 3.72 (L) 3.87 - 5.11 MIL/uL   Hemoglobin 7.9 (L) 12.0 - 15.0 g/dL    Comment: Reticulocyte Hemoglobin testing may be clinically indicated, consider ordering this additional test [id number]    HCT 28.5 (L) 36.0 - 46.0 %   MCV 76.6 (L) 80.0 - 100.0 fL   MCH 21.2 (L) 26.0 - 34.0 pg   MCHC 27.7 (L) 30.0 - 36.0 g/dL   RDW 22.0 (H) 11.5 - 15.5 %   Platelets 308 150 - 400 K/uL  nRBC 0.0 0.0 - 0.2 %     Comment: Performed at [facility], [place] [address] [place], [place] [address]  Hemoglobin     Status: Abnormal   Collection Time: 11/06/20  5:34 AM  Result Value Ref Range   Hemoglobin 8.1 (L) 12.0 - 15.0 g/dL    Comment: Performed at [facility], [place] [address] [place], [place] [address]  Potassium     Status: None   Collection Time: 11/06/20  5:34 AM  Result Value Ref Range   Potassium 4.6 3.5 - 5.1 mmol/L    Comment: Performed at [facility], [place] [address], [place], [place] [address]    No results found.  Past Medical History:  Diagnosis Date  . Anemia   . Arthritis   . Asthma   . 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 Dysphagia   . Heartburn   . Hiatal hernia   . Hypothyroidism   . Obesity   . Thyroid disease     Past Surgical History:  Procedure Laterality Date  . ABDOMINAL HYSTERECTOMY    . BIOPSY  05/25/2020   Procedure: BIOPSY;  Surgeon: Harvel Quale, MD;  [facility];  Service: Gastroenterology;;  duodenum  . COLONOSCOPY WITH PROPOFOL N/A 05/25/2020   Procedure: COLONOSCOPY WITH PROPOFOL;  Surgeon: Harvel Quale, MD;  Location[facility];  Service: Gastroenterology;  Laterality: N/A;  1045  . ESOPHAGOGASTRODUODENOSCOPY (EGD) WITH PROPOFOL N/A 05/25/2020   Procedure: ESOPHAGOGASTRODUODENOSCOPY (EGD) WITH PROPOFOL;  Surgeon: Harvel Quale, MD;  Location[facility];  Service: Gastroenterology;  Laterality: N/A;  . LUMBAR LAMINECTOMY N/A 2001  . MEDIAL PARTIAL KNEE REPLACEMENT Bilateral 2009 & 2015  . POLYPECTOMY  05/25/2020   Procedure: POLYPECTOMY;  Surgeon: Harvel Quale, MD;  Location[facility];  Service: Gastroenterology;;  colon  . TOTAL HIP ARTHROPLASTY Right 2014  . UMBILICAL HERNIA REPAIR N/A 1955    Social History   Socioeconomic History  . Marital status: Married    Spouse name: Not on file  . Number of children:  2  . Years of education: Not on file  . Highest education level: Not on file  Occupational History  . Occupation: RETIRED  Tobacco Use  . Smoking status: Never Smoker  . Smokeless tobacco: Never Used  Vaping Use  . Vaping Use: Never used  Substance and Sexual Activity  . Alcohol use: Never  . Drug use: Not Currently  . Sexual activity: Not Currently  Other Topics Concern  . Not on file  Social History Narrative  . Not on file   Social Determinants of Health   Financial Resource Strain: Not on file  Food Insecurity: Not on file  Transportation Needs: Not on file  Physical Activity: Not on file  Stress: Not on file  Social Connections: Not on file  Intimate Partner Violence: Not on file    Family History  Problem Relation Age of Onset  . Hypertension Mother   . Cancer Mother        Endometrial cancer  . Hypothyroidism Father   . Arthritis Father   . Hypertension Brother   . Heart attack Maternal Grandfather   . Hypertension Son   . Healthy Son     Current Facility-Administered Medications  Medication Dose Route Frequency Provider Last Rate Last Admin  . 0.9 %  sodium chloride infusion  250 mL Intravenous PRN Annette Boston, MD      .  acetaminophen (TYLENOL) 160 MG/5ML solution 650 mg  650 mg Oral Lajuana Ripple, MD   650 mg at 11/06/20 0544  . albuterol (VENTOLIN HFA) 108 (90 Base) MCG/ACT inhaler 1-2 puff  1-2 puff Inhalation Q6H PRN Annette Boston, MD      . azelastine (ASTELIN) 0.1 % nasal spray 1 spray  1 spray Each Nare Ardeen Fillers, MD   1 spray at 11/04/20 2216  . bisacodyl (DULCOLAX) suppository 10 mg  10 mg Rectal Daily PRN Annette Boston, MD   10 mg at 11/06/20 1017  . dexamethasone (DECADRON) injection 4 mg  4 mg Intravenous Gorden Harms, MD   4 mg at 11/06/20 1015  . diphenhydrAMINE (BENADRYL) 12.5 MG/5ML elixir 12.5 mg  12.5 mg Oral Q6H PRN Annette Boston, MD       Or  . diphenhydrAMINE (BENADRYL) injection 12.5 mg  12.5 mg Intravenous Q6H PRN  Annette Boston, MD      . enoxaparin (LOVENOX) injection 40 mg  40 mg Subcutaneous Q24H Annette Boston, MD   40 mg at 11/06/20 0849  . fluticasone (FLONASE) 50 MCG/ACT nasal spray 1 spray  1 spray Each Nare Daily PRN Annette Boston, MD      . lactated ringers bolus 1,000 mL  1,000 mL Intravenous Q8H PRN Oziel Beitler, Remo Lipps, MD      . levothyroxine (SYNTHROID) tablet 100 mcg  100 mcg Oral QAC breakfast Annette Boston, MD   100 mcg at 11/06/20 0544  . lip balm (CARMEX) ointment 1 application  1 application Topical BID Annette Boston, MD   1 application at 96/78/93 1016  . magic mouthwash  15 mL Oral QID PRN Annette Boston, MD      . magnesium hydroxide (MILK OF MAGNESIA) suspension 30 mL  30 mL Oral Daily PRN Annette Boston, MD      . metoCLOPramide (REGLAN) injection 5-10 mg  5-10 mg Intravenous Q8H PRN Annette Boston, MD      . metoprolol tartrate (LOPRESSOR) injection 5 mg  5 mg Intravenous Q6H PRN Annette Boston, MD      . ondansetron [facility]) injection 4 mg  4 mg Intravenous Q6H PRN Annette Boston, MD   4 mg at 11/05/20 0939   Or  . ondansetron (ZOFRAN) 8 mg in sodium chloride 0.9 % 50 mL IVPB  8 mg Intravenous Q6H PRN Annette Boston, MD      . oxyCODONE (ROXICODONE) 5 MG/5ML solution 5-10 mg  5-10 mg Oral Q4H PRN Annette Boston, MD   5 mg at 11/06/20 0859  . prochlorperazine (COMPAZINE) injection 5-10 mg  5-10 mg Intravenous Q4H PRN Annette Boston, MD      . simethicone [facility]) chewable tablet 40 mg  40 mg Oral Q6H PRN Annette Boston, MD   40 mg at 11/04/20 2216  . sodium chloride flush (NS) 0.9 % injection 3 mL  3 mL Intravenous Gorden Harms, MD   3 mL at 11/06/20 1016  . sodium chloride flush (NS) 0.9 % injection 3 mL  3 mL Intravenous PRN Annette Boston, MD         Allergies  Allergen Reactions  . Cefuroxime Axetil Nausea And Vomiting  . Skelaxin [Metaxalone] Nausea And Vomiting  . Codeine Palpitations  . Tincture Of Benzoin [Benzoin] Rash    Signed: Morton Peters, MD,  FACS, MASCRS Gastrointestinal and Minimally Invasive Surgery  [facility] Surgery [address] [place], [place] [address] [phone] Fax [phone] Main/Paging  CONTACT INFORMATION: Weekday (9AM-5PM) concerns: Call [facility] main office at [phone] Weeknight (5PM-9AM) or Weekend/Holiday concerns: Check [email or web address] for General Surgery [facility] coverage (Please, do not use SecureChat as it is not reliable communication to operating surgeons for immediate patient care)      11/06/2020, 11:39 AM

## 2020-11-06 NOTE — Progress Notes (Signed)
JP drain removed intact, dry guaze dressing w tape applied to site

## 2020-11-06 NOTE — Plan of Care (Signed)
Reviewed d/c instructions w pt and all questions answered. Pt d/c via w/c w all belongings in stable condition

## 2020-11-07 ENCOUNTER — Encounter (HOSPITAL_COMMUNITY): Payer: Self-pay | Admitting: Surgery

## 2020-11-07 LAB — SURGICAL PATHOLOGY

## 2020-11-10 ENCOUNTER — Other Ambulatory Visit (HOSPITAL_COMMUNITY): Payer: Self-pay | Admitting: *Deleted

## 2020-11-10 ENCOUNTER — Ambulatory Visit (HOSPITAL_COMMUNITY): Payer: Medicare Other | Admitting: Nurse Practitioner

## 2020-11-10 DIAGNOSIS — D509 Iron deficiency anemia, unspecified: Secondary | ICD-10-CM

## 2020-11-11 ENCOUNTER — Other Ambulatory Visit: Payer: Self-pay

## 2020-11-11 ENCOUNTER — Inpatient Hospital Stay (HOSPITAL_COMMUNITY): Payer: Medicare Other | Attending: Hematology

## 2020-11-11 DIAGNOSIS — E039 Hypothyroidism, unspecified: Secondary | ICD-10-CM | POA: Insufficient documentation

## 2020-11-11 DIAGNOSIS — Z79899 Other long term (current) drug therapy: Secondary | ICD-10-CM | POA: Diagnosis not present

## 2020-11-11 DIAGNOSIS — Z8249 Family history of ischemic heart disease and other diseases of the circulatory system: Secondary | ICD-10-CM | POA: Insufficient documentation

## 2020-11-11 DIAGNOSIS — Z8049 Family history of malignant neoplasm of other genital organs: Secondary | ICD-10-CM | POA: Diagnosis not present

## 2020-11-11 DIAGNOSIS — Z9049 Acquired absence of other specified parts of digestive tract: Secondary | ICD-10-CM | POA: Diagnosis not present

## 2020-11-11 DIAGNOSIS — Z8261 Family history of arthritis: Secondary | ICD-10-CM | POA: Insufficient documentation

## 2020-11-11 DIAGNOSIS — D509 Iron deficiency anemia, unspecified: Secondary | ICD-10-CM | POA: Diagnosis present

## 2020-11-11 DIAGNOSIS — Z8349 Family history of other endocrine, nutritional and metabolic diseases: Secondary | ICD-10-CM | POA: Insufficient documentation

## 2020-11-11 DIAGNOSIS — J45909 Unspecified asthma, uncomplicated: Secondary | ICD-10-CM | POA: Diagnosis not present

## 2020-11-11 DIAGNOSIS — E669 Obesity, unspecified: Secondary | ICD-10-CM | POA: Insufficient documentation

## 2020-11-11 LAB — CBC WITH DIFFERENTIAL/PLATELET
Abs Immature Granulocytes: 0.08 10*3/uL — ABNORMAL HIGH (ref 0.00–0.07)
Basophils Absolute: 0 10*3/uL (ref 0.0–0.1)
Basophils Relative: 1 %
Eosinophils Absolute: 0.3 10*3/uL (ref 0.0–0.5)
Eosinophils Relative: 3 %
HCT: 32.8 % — ABNORMAL LOW (ref 36.0–46.0)
Hemoglobin: 9 g/dL — ABNORMAL LOW (ref 12.0–15.0)
Immature Granulocytes: 1 %
Lymphocytes Relative: 9 %
Lymphs Abs: 0.8 10*3/uL (ref 0.7–4.0)
MCH: 22.1 pg — ABNORMAL LOW (ref 26.0–34.0)
MCHC: 27.4 g/dL — ABNORMAL LOW (ref 30.0–36.0)
MCV: 80.6 fL (ref 80.0–100.0)
Monocytes Absolute: 0.7 10*3/uL (ref 0.1–1.0)
Monocytes Relative: 8 %
Neutro Abs: 6.8 10*3/uL (ref 1.7–7.7)
Neutrophils Relative %: 78 %
Platelets: 297 10*3/uL (ref 150–400)
RBC: 4.07 MIL/uL (ref 3.87–5.11)
RDW: 26 % — ABNORMAL HIGH (ref 11.5–15.5)
WBC: 8.6 10*3/uL (ref 4.0–10.5)
nRBC: 0 % (ref 0.0–0.2)

## 2020-11-11 LAB — COMPREHENSIVE METABOLIC PANEL
ALT: 42 U/L (ref 0–44)
AST: 15 U/L (ref 15–41)
Albumin: 3.4 g/dL — ABNORMAL LOW (ref 3.5–5.0)
Alkaline Phosphatase: 80 U/L (ref 38–126)
Anion gap: 10 (ref 5–15)
BUN: 11 mg/dL (ref 8–23)
CO2: 25 mmol/L (ref 22–32)
Calcium: 9.2 mg/dL (ref 8.9–10.3)
Chloride: 104 mmol/L (ref 98–111)
Creatinine, Ser: 0.84 mg/dL (ref 0.44–1.00)
GFR, Estimated: >60 mL/min (ref >60)
Glucose, Bld: 121 mg/dL — ABNORMAL HIGH (ref 70–99)
Potassium: 4.2 mmol/L (ref 3.5–5.1)
Sodium: 139 mmol/L (ref 135–145)
Total Bilirubin: 0.5 mg/dL (ref 0.3–1.2)
Total Protein: 6.7 g/dL (ref 6.5–8.1)

## 2020-11-11 LAB — SAMPLE TO BLOOD BANK

## 2020-11-14 ENCOUNTER — Other Ambulatory Visit (HOSPITAL_COMMUNITY): Payer: Medicare Other

## 2020-11-16 ENCOUNTER — Other Ambulatory Visit: Payer: Self-pay

## 2020-11-16 ENCOUNTER — Inpatient Hospital Stay (HOSPITAL_COMMUNITY): Payer: Medicare Other

## 2020-11-16 ENCOUNTER — Inpatient Hospital Stay (HOSPITAL_BASED_OUTPATIENT_CLINIC_OR_DEPARTMENT_OTHER): Payer: Medicare Other | Admitting: Hematology

## 2020-11-16 VITALS — BP 129/58 | HR 73 | Temp 97.2°F | Resp 17 | Wt 219.2 lb

## 2020-11-16 DIAGNOSIS — D509 Iron deficiency anemia, unspecified: Secondary | ICD-10-CM | POA: Diagnosis not present

## 2020-11-16 LAB — FOLATE: Folate: 10.5 ng/mL (ref >5.9)

## 2020-11-16 LAB — IRON AND TIBC
Iron: 27 ug/dL — ABNORMAL LOW (ref 28–170)
Saturation Ratios: 7 % — ABNORMAL LOW (ref 10.4–31.8)
TIBC: 388 ug/dL (ref 250–450)
UIBC: 361 ug/dL

## 2020-11-16 LAB — FERRITIN: Ferritin: 138 ng/mL (ref 11–307)

## 2020-11-16 LAB — VITAMIN B12: Vitamin B-12: 480 pg/mL (ref 180–914)

## 2020-11-16 NOTE — Patient Instructions (Signed)
[place] at [facility] Discharge Instructions  You were seen today by Dr. Delton Coombes. He went over your recent results. You had labs done today to see if you need an iron infusion. Dr. Delton Coombes will inform you when to return based on your labs from today.   Thank you for choosing [place] at [facility] to provide your oncology and hematology care.  To afford each patient quality time with our provider, please arrive at least 15 minutes before your scheduled appointment time.   If you have a lab appointment with the [facility] please come in thru the Main Entrance and check in at the main information desk  You need to re-schedule your appointment should you arrive 10 or more minutes late.  We strive to give you quality time with our providers, and arriving late affects you and other patients whose appointments are after yours.  Also, if you no show three or more times for appointments you may be dismissed from the clinic at the providers discretion.     Again, thank you for choosing [facility].  Our hope is that these requests will decrease the amount of time that you wait before being seen by our physicians.       _____________________________________________________________  Should you have questions after your visit to [facility], please contact our office at [phone] between the hours of 8:00 a.m. and 4:30 p.m.  Voicemails left after 4:00 p.m. will not be returned until the following business day.  For prescription refill requests, have your pharmacy contact our office and allow 72 hours.    Cancer Center Support Programs:   > Cancer Support Group  2nd Tuesday of the month 1pm-2pm, Journey Room

## 2020-11-16 NOTE — Progress Notes (Signed)
Annette Frank, [place] [address]   CLINIC:  Medical Oncology/Hematology  PCP:  Thea Alken [address] / [place] [address]  [phone]  REASON FOR VISIT:  Follow-up for IDA  PRIOR THERAPY: None  CURRENT THERAPY: Intermittent Feraheme last on 08/18/2020  INTERVAL HISTORY:  [name], a 70 y.o. female, returns for routine follow-up for her IDA. [name] was last contacted via telephone by Faythe Casa, NP, on 08/08/2020.  Today she reports feeling okay. She had a robotic paraesophageal hiatal repair done on 11/04/2020 with Dr. Michael Boston. Her incisions are healing nicely. She tolerated the previous iron infusion well. She denies having melena or leg swelling.   REVIEW OF SYSTEMS:  Review of Systems  Constitutional: Positive for appetite change (25%) and fatigue (25%).  Respiratory: Positive for shortness of breath (w/ exertion).   Cardiovascular: Negative for leg swelling.  Gastrointestinal: Positive for abdominal pain (5/10 L flank pain) and diarrhea.  Psychiatric/Behavioral: Positive for sleep disturbance.  All other systems reviewed and are negative.   PAST MEDICAL/SURGICAL HISTORY:  Past Medical History:  Diagnosis Date  . Anemia   . Arthritis   . Asthma   . Cancer ([place])    basal cell on left leg  . Dysphagia   . Heartburn   . Hiatal hernia   . Hypothyroidism   . Obesity   . Thyroid disease    Past Surgical History:  Procedure Laterality Date  . ABDOMINAL HYSTERECTOMY    . BIOPSY  05/25/2020   Procedure: BIOPSY;  Surgeon: Harvel Quale, MD;  [facility];  Service: Gastroenterology;;  duodenum  . COLONOSCOPY WITH PROPOFOL N/A 05/25/2020   Procedure: COLONOSCOPY WITH PROPOFOL;  Surgeon: Harvel Quale, MD;  [facility];  Service: Gastroenterology;  Laterality: N/A;  1045  . ESOPHAGOGASTRODUODENOSCOPY (EGD) WITH PROPOFOL N/A 05/25/2020   Procedure:  ESOPHAGOGASTRODUODENOSCOPY (EGD) WITH PROPOFOL;  Surgeon: Harvel Quale, MD;  [facility];  Service: Gastroenterology;  Laterality: N/A;  . LUMBAR LAMINECTOMY N/A 2001  . MEDIAL PARTIAL KNEE REPLACEMENT Bilateral 2009 & 2015  . POLYPECTOMY  05/25/2020   Procedure: POLYPECTOMY;  Surgeon: Harvel Quale, MD;  [facility];  Service: Gastroenterology;;  colon  . TOTAL HIP ARTHROPLASTY Right 2014  . UMBILICAL HERNIA REPAIR N/A 1955  . XI ROBOTIC ASSISTED HIATAL HERNIA REPAIR N/A 11/04/2020   Procedure: ROBOTIC REPAIR OF PARAESOPHAGEAL HIATAL HERNIA WITH TOUPET FUNDOPLICATION WITH MESH, ROBOTIC CHOLECYSTECTOMY, PARTIAL OMENTECTOMY, BILATERAL TAP BLOCK;  Surgeon: Michael Boston, MD;  Location: [facility];  Service: General;  Laterality: N/A;    SOCIAL HISTORY:  Social History   Socioeconomic History  . Marital status: Married    Spouse name: Not on file  . Number of children: 2  . Years of education: Not on file  . Highest education level: Not on file  Occupational History  . Occupation: RETIRED  Tobacco Use  . Smoking status: Never Smoker  . Smokeless tobacco: Never Used  Vaping Use  . Vaping Use: Never used  Substance and Sexual Activity  . Alcohol use: Never  . Drug use: Not Currently  . Sexual activity: Not Currently  Other Topics Concern  . Not on file  Social History Narrative  . Not on file   Social Determinants of Health   Financial Resource Strain: Not on file  Food Insecurity: Not on file  Transportation Needs: Not on file  Physical Activity: Not on  file  Stress: Not on file  Social Connections: Not on file  Intimate Partner Violence: Not on file    FAMILY HISTORY:  Family History  Problem Relation Age of Onset  . Hypertension Mother   . Cancer Mother        Endometrial cancer  . Hypothyroidism Father   . Arthritis Father   . Hypertension Brother   . Heart attack Maternal Grandfather   . Hypertension Son   . Healthy  Son     CURRENT MEDICATIONS:  Current Outpatient Medications  Medication Sig Dispense Refill  . atenolol (TENORMIN) 25 MG tablet Take 25 mg by mouth daily.     Marland Kitchen azelastine (ASTELIN) 0.1 % nasal spray Place 1 spray into both nostrils at bedtime.    . cephALEXin (KEFLEX) 500 MG capsule Take 2,000 mg by mouth See admin instructions. Before dental procedures    . fluticasone (FLONASE) 50 MCG/ACT nasal spray Place 1 spray into both nostrils daily as needed for allergies.     Marland Kitchen levothyroxine (SYNTHROID) 100 MCG tablet Take 100 mcg by mouth daily before breakfast.    . Vitamin D, Ergocalciferol, (DRISDOL) 1.25 MG (50000 UT) CAPS capsule Take 50,000 Units by mouth once a week.    Marland Kitchen acetaminophen (TYLENOL) 500 MG tablet Take 1,000 mg by mouth every 6 (six) hours as needed for moderate pain.  (Patient not taking: Reported on 11/16/2020)    . albuterol (VENTOLIN HFA) 108 (90 Base) MCG/ACT inhaler Inhale 1-2 puffs into the lungs every 6 (six) hours as needed for wheezing or shortness of breath.  (Patient not taking: Reported on 11/16/2020)    . ibuprofen (ADVIL) 200 MG tablet Take 400 mg by mouth every 8 (eight) hours as needed for moderate pain. (Patient not taking: Reported on 11/16/2020)    . ondansetron (ZOFRAN) 4 MG tablet Take 1 tablet (4 mg total) by mouth every 8 (eight) hours as needed for nausea. (Patient not taking: Reported on 11/16/2020) 8 tablet 5  . oxyCODONE (OXY IR/ROXICODONE) 5 MG immediate release tablet Take 1-2 tablets (5-10 mg total) by mouth every 6 (six) hours as needed for moderate pain, severe pain or breakthrough pain. (Patient not taking: Reported on 11/16/2020) 30 tablet 0   No current facility-administered medications for this visit.    ALLERGIES:  Allergies  Allergen Reactions  . Cefuroxime Axetil Nausea And Vomiting  . Skelaxin [Metaxalone] Nausea And Vomiting  . Codeine Palpitations  . Tincture Of Benzoin [Benzoin] Rash    PHYSICAL EXAM:  Performance status (ECOG): 1 -  Symptomatic but completely ambulatory  Vitals:   11/16/20 1145  BP: (!) 129/58  Pulse: 73  Resp: 17  Temp: (!) 97.2 F (36.2 C)  SpO2: 100%   Wt Readings from Last 3 Encounters:  11/16/20 219 lb 3.2 oz (99.4 kg)  11/04/20 232 lb 1 oz (105.3 kg)  10/24/20 232 lb 1 oz (105.3 kg)   Physical Exam Vitals reviewed.  Constitutional:      Appearance: Normal appearance. She is obese.  Cardiovascular:     Rate and Rhythm: Normal rate and regular rhythm.     Pulses: Normal pulses.     Heart sounds: Normal heart sounds.  Pulmonary:     Effort: Pulmonary effort is normal.     Breath sounds: Normal breath sounds.  Neurological:     General: No focal deficit present.     Mental Status: She is alert and oriented to person, place, and time.  Psychiatric:  Mood and Affect: Mood normal.        Behavior: Behavior normal.     LABORATORY DATA:  I have reviewed the labs as listed.  CBC Latest Ref Rng & Units 11/11/2020 11/06/2020 11/05/2020  WBC 4.0 - 10.5 K/uL 8.6 - 13.8(H)  Hemoglobin 12.0 - 15.0 g/dL 9.0(L) 8.1(L) 7.9(L)  Hematocrit 36.0 - 46.0 % 32.8(L) - 28.5(L)  Platelets 150 - 400 K/uL 297 - 308   CMP Latest Ref Rng & Units 11/11/2020 11/06/2020 11/05/2020  Glucose 70 - 99 mg/dL 121(H) - 135(H)  BUN 8 - 23 mg/dL 11 - 13  Creatinine 0.44 - 1.00 mg/dL 0.84 - 0.88  Sodium 135 - 145 mmol/L 139 - 139  Potassium 3.5 - 5.1 mmol/L 4.2 4.6 4.8  Chloride 98 - 111 mmol/L 104 - 105  CO2 22 - 32 mmol/L 25 - 23  Calcium 8.9 - 10.3 mg/dL 9.2 - 9.5  Total Protein 6.5 - 8.1 g/dL 6.7 - -  Total Bilirubin 0.3 - 1.2 mg/dL 0.5 - -  Alkaline Phos 38 - 126 U/L 80 - -  AST 15 - 41 U/L 15 - -  ALT 0 - 44 U/L 42 - -      Component Value Date/Time   RBC 4.07 11/11/2020 1052   MCV 80.6 11/11/2020 1052   MCH 22.1 (L) 11/11/2020 1052   MCHC 27.4 (L) 11/11/2020 1052   RDW 26.0 (H) 11/11/2020 1052   LYMPHSABS 0.8 11/11/2020 1052   MONOABS 0.7 11/11/2020 1052   EOSABS 0.3 11/11/2020 1052   BASOSABS  0.0 11/11/2020 1052    DIAGNOSTIC IMAGING:  I have independently reviewed the scans and discussed with the patient. DG ESOPHAGUS W SINGLE CM (SOL OR THIN BA)  Result Date: 11/05/2020 CLINICAL DATA:  Fundoplication performed yesterday. EXAM: ESOPHOGRAM/BARIUM SWALLOW TECHNIQUE: Single contrast examination was performed using  Gastrografin. FLUOROSCOPY TIME:  Fluoroscopy Time:  0 minutes 42 seconds Radiation Exposure Index (if provided by the fluoroscopic device): 1 6 8 5  micro gray meter squared COMPARISON:  None. FINDINGS: The patient swallowed Gastrografin without difficulty. There is mild relative delay passage of contrast through the region of fundoplication. Tiny outpouching in the region of the fundoplication but no leak. IMPRESSION: No obstruction. No leak. Slight delay to passage of contrast through the region of fundoplication as often seen in the early postoperative time frame. Tiny outpouching of contrast in the region of fundoplication but no actual leak. Electronically Signed   By: Nelson Chimes M.D.   On: 11/05/2020 12:55     ASSESSMENT:  1.  Iron deficiency anemia: -Originally noted in 2010 worked up by gastroenterology with endoscopy and colonoscopy. -Anemia thought to be secondary to duodenal ulcers and poor absorption. -Anemia continued, repeated endoscopy and colonoscopy in 2015 did not reveal any evidence of bleeding.  Anemia thought to be secondary to poor absorption. -Repeat endoscopy and colonoscopy in 2019 did not reveal any evidence of bleeding. -Bone marrow biopsy completed in September 2020 showed normal marrow.  No evidence of hematologic neoplasm or morphology.  Normal cytogenetics.  No increase in blasts on flow cytometry. -Patient is unable to tolerate p.o. iron supplements due to GI upset.  However she is continuing to take iron tablets on and off. -Patient is unable to tolerate Venofer due to flulike symptoms.  She does not respond to IV Ferrlecit. -Patient has been  extensively worked up for her anemia.  It appears anemia is most likely secondary to poor GI absorption. -Work-up labs done  on 12/03/2019 showed SPEP negative.  Creatinine 0.83, vitamin B12 221, WBC 6.5, hemoglobin 10.1, platelets 254. -Patient reports she felt better after her Injectafer infusions but is starting to feel fatigued again. -Last Feraheme infusions were on 04/21/2020, 05/17/2020, 05/05/2020 with premeds -We sent her to GI she had her colonoscopy on 05/25/2020 which showed examined portion of the ileum was normal.  A 4 mm polyp in the transverse colon, removed.  Diverticulosis in the sigmoid colon.  Also found a 2 mm polyp in the descending colon, removed.  The distal rectum and anal verge are normal on retroflexion view. -Endoscopy on 05/25/2020 showed normal esophagus.  5 cm hiatal hernia.  Cameron lesions.  Erosive gastropathy with no stigmata of recent bleeding, biopsied.  Normal examined duodenum. -Also showed on exam a large gallstone.  She is scheduled to have surgery to remove her gallstone.  GI suggests that the [facility] lesions were the cause of her anemia.    PLAN:  1.  Iron deficiency anemia: - She recently had hiatal hernia repair as well as cholecystectomy. - She reportedly received iron infusion in the hospital.  I could not find it in the records. - CBC on 11/11/2020 with hemoglobin 9, MCV 80.Last Feraheme on 08/18/2020. - Ferritin today is 138 with percent saturation 7.  Folic acid 12.9 and W90 480. - Recommend follow-up in 6 weeks with repeat labs.  Orders placed this encounter:  Orders Placed This Encounter  Procedures  . Iron and TIBC  . Vitamin B12  . Folate  . Ferritin     Derek Jack, MD [place] [phone]   I, Milinda Antis, am acting as a scribe for Dr. Sanda Linger.  I, Derek Jack MD, have reviewed the above documentation for accuracy and completeness, and I agree with the above.

## 2020-11-23 ENCOUNTER — Encounter (HOSPITAL_COMMUNITY): Payer: Self-pay

## 2020-11-23 ENCOUNTER — Other Ambulatory Visit (HOSPITAL_COMMUNITY): Payer: Self-pay

## 2020-11-23 DIAGNOSIS — D649 Anemia, unspecified: Secondary | ICD-10-CM

## 2020-11-23 DIAGNOSIS — D509 Iron deficiency anemia, unspecified: Secondary | ICD-10-CM

## 2020-12-22 ENCOUNTER — Other Ambulatory Visit: Payer: Self-pay

## 2020-12-22 ENCOUNTER — Inpatient Hospital Stay (HOSPITAL_COMMUNITY): Payer: Medicare Other | Attending: Hematology

## 2020-12-22 DIAGNOSIS — D649 Anemia, unspecified: Secondary | ICD-10-CM

## 2020-12-22 DIAGNOSIS — D509 Iron deficiency anemia, unspecified: Secondary | ICD-10-CM | POA: Insufficient documentation

## 2020-12-22 DIAGNOSIS — E538 Deficiency of other specified B group vitamins: Secondary | ICD-10-CM | POA: Insufficient documentation

## 2020-12-22 LAB — FOLATE: Folate: 8.1 ng/mL (ref >5.9)

## 2020-12-22 LAB — VITAMIN B12: Vitamin B-12: 174 pg/mL — ABNORMAL LOW (ref 180–914)

## 2020-12-22 LAB — IRON AND TIBC
Iron: 35 ug/dL (ref 28–170)
Saturation Ratios: 8 % — ABNORMAL LOW (ref 10.4–31.8)
TIBC: 452 ug/dL — ABNORMAL HIGH (ref 250–450)
UIBC: 417 ug/dL

## 2020-12-22 LAB — FERRITIN: Ferritin: 13 ng/mL (ref 11–307)

## 2020-12-28 ENCOUNTER — Other Ambulatory Visit (HOSPITAL_COMMUNITY): Payer: Medicare Other

## 2020-12-28 ENCOUNTER — Other Ambulatory Visit: Payer: Self-pay

## 2020-12-28 ENCOUNTER — Inpatient Hospital Stay (HOSPITAL_COMMUNITY): Payer: Medicare Other | Attending: Medical | Admitting: Hematology

## 2020-12-28 VITALS — BP 145/76 | HR 68 | Temp 97.0°F | Resp 18 | Wt 220.0 lb

## 2020-12-28 DIAGNOSIS — Z8249 Family history of ischemic heart disease and other diseases of the circulatory system: Secondary | ICD-10-CM | POA: Diagnosis not present

## 2020-12-28 DIAGNOSIS — D509 Iron deficiency anemia, unspecified: Secondary | ICD-10-CM | POA: Diagnosis present

## 2020-12-28 DIAGNOSIS — Z8049 Family history of malignant neoplasm of other genital organs: Secondary | ICD-10-CM | POA: Diagnosis not present

## 2020-12-28 DIAGNOSIS — Z79899 Other long term (current) drug therapy: Secondary | ICD-10-CM | POA: Insufficient documentation

## 2020-12-28 DIAGNOSIS — E538 Deficiency of other specified B group vitamins: Secondary | ICD-10-CM | POA: Insufficient documentation

## 2020-12-28 DIAGNOSIS — Z90710 Acquired absence of both cervix and uterus: Secondary | ICD-10-CM | POA: Insufficient documentation

## 2020-12-28 MED ORDER — CYANOCOBALAMIN 1000 MCG/ML IJ SOLN
1000.0000 ug | Freq: Once | INTRAMUSCULAR | Status: AC
  Administered 2020-12-28: 1000 ug via INTRAMUSCULAR
  Filled 2020-12-28: qty 1

## 2020-12-28 NOTE — Progress Notes (Signed)
[place], [place] [address]   CLINIC:  Medical Oncology/Hematology  PCP:  Thea Alken [address] / [place] [address]  [phone]  REASON FOR VISIT:  Follow-up for IDA  PRIOR THERAPY: Robotic assisted hiatal hernia repair on 11/04/2020  CURRENT THERAPY: Intermittent Feraheme last on 08/18/2020  INTERVAL HISTORY:  Ms. Tessica Cupo, a 70 y.o. female, returns for routine follow-up for her IDA. Junelle was last seen on 11/16/2020.  Today she reports feeling okay. She complains of having no energy since her hiatal hernia surgery on 01/07. She has not been taking vitamin B12 tablets recently. She denies having melena, hematochezia or hematuria, though her stool is softer since the surgery and she is having BM's more frequently daily. She tolerated the previous Feraheme infusion well, though she notes having aching in her joints for several weeks post-infusion.   REVIEW OF SYSTEMS:  Review of Systems  Constitutional: Positive for fatigue (75%). Negative for appetite change.  Gastrointestinal: Negative for blood in stool, constipation and diarrhea.  Genitourinary: Negative for hematuria.   Musculoskeletal: Positive for back pain (7/10 back pain).  All other systems reviewed and are negative.   PAST MEDICAL/SURGICAL HISTORY:  Past Medical History:  Diagnosis Date  . Anemia   . Arthritis   . Asthma   . Cancer ([place])    basal cell on left leg  . Dysphagia   . Heartburn   . Hiatal hernia   . Hypothyroidism   . Obesity   . Thyroid disease    Past Surgical History:  Procedure Laterality Date  . ABDOMINAL HYSTERECTOMY    . BIOPSY  05/25/2020   Procedure: BIOPSY;  Surgeon: Harvel Quale, MD;  [facility];  Service: Gastroenterology;;  duodenum  . COLONOSCOPY WITH PROPOFOL N/A 05/25/2020   Procedure: COLONOSCOPY WITH PROPOFOL;  Surgeon: Harvel Quale, MD;  [facility];  Service:  Gastroenterology;  Laterality: N/A;  1045  . ESOPHAGOGASTRODUODENOSCOPY (EGD) WITH PROPOFOL N/A 05/25/2020   Procedure: ESOPHAGOGASTRODUODENOSCOPY (EGD) WITH PROPOFOL;  Surgeon: Harvel Quale, MD;  [facility];  Service: Gastroenterology;  Laterality: N/A;  . LUMBAR LAMINECTOMY N/A 2001  . MEDIAL PARTIAL KNEE REPLACEMENT Bilateral 2009 & 2015  . POLYPECTOMY  05/25/2020   Procedure: POLYPECTOMY;  Surgeon: Harvel Quale, MD;  [facility];  Service: Gastroenterology;;  colon  . TOTAL HIP ARTHROPLASTY Right 2014  . UMBILICAL HERNIA REPAIR N/A 1955  . XI ROBOTIC ASSISTED HIATAL HERNIA REPAIR N/A 11/04/2020   Procedure: ROBOTIC REPAIR OF PARAESOPHAGEAL HIATAL HERNIA WITH TOUPET FUNDOPLICATION WITH MESH, ROBOTIC CHOLECYSTECTOMY, PARTIAL OMENTECTOMY, BILATERAL TAP BLOCK;  Surgeon: Michael Boston, MD;  Location[facility];  Service: General;  Laterality: N/A;    SOCIAL HISTORY:  Social History   Socioeconomic History  . Marital status: Married    Spouse name: Not on file  . Number of children: 2  . Years of education: Not on file  . Highest education level: Not on file  Occupational History  . Occupation: RETIRED  Tobacco Use  . Smoking status: Never Smoker  . Smokeless tobacco: Never Used  Vaping Use  . Vaping Use: Never used  Substance and Sexual Activity  . Alcohol use: Never  . Drug use: Not Currently  . Sexual activity: Not Currently  Other Topics Concern  . Not on file  Social History Narrative  . Not on file   Social Determinants of Health   Financial  Resource Strain: Not on file  Food Insecurity: Not on file  Transportation Needs: Not on file  Physical Activity: Not on file  Stress: Not on file  Social Connections: Not on file  Intimate Partner Violence: Not on file    FAMILY HISTORY:  Family History  Problem Relation Age of Onset  . Hypertension Mother   . Cancer Mother        Endometrial cancer  . Hypothyroidism Father    . Arthritis Father   . Hypertension Brother   . Heart attack Maternal Grandfather   . Hypertension Son   . Healthy Son     CURRENT MEDICATIONS:  Current Outpatient Medications  Medication Sig Dispense Refill  . acetaminophen (TYLENOL) 500 MG tablet Take 1,000 mg by mouth every 6 (six) hours as needed for moderate pain.    Marland Kitchen albuterol (VENTOLIN HFA) 108 (90 Base) MCG/ACT inhaler Inhale 1-2 puffs into the lungs every 6 (six) hours as needed for wheezing or shortness of breath.    Marland Kitchen atenolol (TENORMIN) 25 MG tablet Take 25 mg by mouth daily.     Marland Kitchen azelastine (ASTELIN) 0.1 % nasal spray Place 1 spray into both nostrils at bedtime.    . cephALEXin (KEFLEX) 500 MG capsule Take 2,000 mg by mouth See admin instructions. Before dental procedures    . fluticasone (FLONASE) 50 MCG/ACT nasal spray Place 1 spray into both nostrils daily as needed for allergies.     Marland Kitchen ibuprofen (ADVIL) 200 MG tablet Take 400 mg by mouth every 8 (eight) hours as needed for moderate pain.    Marland Kitchen levothyroxine (SYNTHROID) 100 MCG tablet Take 100 mcg by mouth daily before breakfast.    . ondansetron (ZOFRAN) 4 MG tablet Take 1 tablet (4 mg total) by mouth every 8 (eight) hours as needed for nausea. 8 tablet 5  . oxyCODONE (OXY IR/ROXICODONE) 5 MG immediate release tablet Take 1-2 tablets (5-10 mg total) by mouth every 6 (six) hours as needed for moderate pain, severe pain or breakthrough pain. 30 tablet 0  . Vitamin D, Ergocalciferol, (DRISDOL) 1.25 MG (50000 UT) CAPS capsule Take 50,000 Units by mouth once a week.     No current facility-administered medications for this visit.    ALLERGIES:  Allergies  Allergen Reactions  . Cefuroxime Axetil Nausea And Vomiting  . Skelaxin [Metaxalone] Nausea And Vomiting  . Codeine Palpitations  . Tincture Of Benzoin [Benzoin] Rash    PHYSICAL EXAM:  Performance status (ECOG): 1 - Symptomatic but completely ambulatory  Vitals:   12/28/20 1517  BP: (!) 145/76  Pulse: 68   Resp: 18  Temp: (!) 97 F (36.1 C)  SpO2: 99%   Wt Readings from Last 3 Encounters:  12/28/20 220 lb (99.8 kg)  11/16/20 219 lb 3.2 oz (99.4 kg)  11/04/20 232 lb 1 oz (105.3 kg)   Physical Exam Vitals reviewed.  Constitutional:      Appearance: Normal appearance. She is obese.  Cardiovascular:     Rate and Rhythm: Normal rate and regular rhythm.     Pulses: Normal pulses.     Heart sounds: Normal heart sounds.  Pulmonary:     Effort: Pulmonary effort is normal.     Breath sounds: Normal breath sounds.  Musculoskeletal:     Right lower leg: No edema.     Left lower leg: No edema.  Neurological:     General: No focal deficit present.     Mental Status: She is alert and oriented to person,  place, and time.  Psychiatric:        Mood and Affect: Mood normal.        Behavior: Behavior normal.     LABORATORY DATA:  I have reviewed the labs as listed.  CBC Latest Ref Rng & Units 11/11/2020 11/06/2020 11/05/2020  WBC 4.0 - 10.5 K/uL 8.6 - 13.8(H)  Hemoglobin 12.0 - 15.0 g/dL 9.0(L) 8.1(L) 7.9(L)  Hematocrit 36.0 - 46.0 % 32.8(L) - 28.5(L)  Platelets 150 - 400 K/uL 297 - 308   CMP Latest Ref Rng & Units 11/11/2020 11/06/2020 11/05/2020  Glucose 70 - 99 mg/dL 121(H) - 135(H)  BUN 8 - 23 mg/dL 11 - 13  Creatinine 0.44 - 1.00 mg/dL 0.84 - 0.88  Sodium 135 - 145 mmol/L 139 - 139  Potassium 3.5 - 5.1 mmol/L 4.2 4.6 4.8  Chloride 98 - 111 mmol/L 104 - 105  CO2 22 - 32 mmol/L 25 - 23  Calcium 8.9 - 10.3 mg/dL 9.2 - 9.5  Total Protein 6.5 - 8.1 g/dL 6.7 - -  Total Bilirubin 0.3 - 1.2 mg/dL 0.5 - -  Alkaline Phos 38 - 126 U/L 80 - -  AST 15 - 41 U/L 15 - -  ALT 0 - 44 U/L 42 - -      Component Value Date/Time   RBC 4.07 11/11/2020 1052   MCV 80.6 11/11/2020 1052   MCH 22.1 (L) 11/11/2020 1052   MCHC 27.4 (L) 11/11/2020 1052   RDW 26.0 (H) 11/11/2020 1052   LYMPHSABS 0.8 11/11/2020 1052   MONOABS 0.7 11/11/2020 1052   EOSABS 0.3 11/11/2020 1052   BASOSABS 0.0 11/11/2020 1052    Lab Results  Component Value Date   TIBC 452 (H) 12/22/2020   TIBC 388 11/16/2020   TIBC 502 (H) 08/05/2020   FERRITIN 13 12/22/2020   FERRITIN 138 11/16/2020   FERRITIN 4 (L) 08/05/2020   IRONPCTSAT 8 (L) 12/22/2020   IRONPCTSAT 7 (L) 11/16/2020   IRONPCTSAT 3 (L) 08/05/2020     DIAGNOSTIC IMAGING:  I have independently reviewed the scans and discussed with the patient. No results found.   ASSESSMENT:  1. Iron deficiency anemia: -Originally noted in 2010 worked up by gastroenterology with endoscopy and colonoscopy. -Anemia thought to be secondary to duodenal ulcers and poor absorption. -Anemia continued, repeated endoscopy and colonoscopy in 2015 did not reveal any evidence of bleeding. Anemia thought to be secondary to poor absorption. -Repeat endoscopy and colonoscopy in 2019 did not reveal any evidence of bleeding. -Bone marrow biopsy completed in September 2020 showed normal marrow. No evidence of hematologic neoplasm or morphology. Normal cytogenetics. No increase in blasts on flow cytometry. -Patient is unable to tolerate p.o. iron supplements due to GI upset. However she is continuing to take iron tablets on and off. -Patient is unable to tolerate Venofer due to flulike symptoms. She does not respond to IV Ferrlecit. -Patient has been extensively worked up for her anemia. It appears anemia is most likely secondary to poor GI absorption. -Work-up labs done on 12/03/2019 showed SPEP negative. Creatinine 0.83, vitamin B12 221, WBC 6.5, hemoglobin 10.1, platelets 254. -Patient reports she felt better after her Injectafer infusions but is starting to feel fatigued again. -Last Feraheme infusions were on 04/21/2020, 05/17/2020, 05/05/2020 with premeds -We sent her to GI she had her colonoscopy on 05/25/2020 which showed examined portion of the ileum was normal. A 4 mm polyp in the transverse colon, removed. Diverticulosis in the sigmoid colon. Also found a 2  mm polyp in the  descending colon, removed. The distal rectum and anal verge are normal on retroflexion view. -Endoscopy on 05/25/2020 showed normal esophagus. 5 cm hiatal hernia. [place] lesions. Erosive gastropathy with no stigmata of recent bleeding, biopsied. Normal examined duodenum. -Also showed on exam a large gallstone. She is scheduled to have surgery to remove her gallstone. GI suggests that the [facility] lesions were the cause of her anemia.   PLAN:  1. Iron deficiency anemia: -She denies any bleeding per rectum or melena. -We have reviewed her labs from 12/22/2020.  Ferritin dropped to 13 from 138. -Recommend Feraheme weekly x2. -RTC 8 weeks with repeat labs.  2.  Vitamin B12 deficiency: -Vitamin B12 level is 174.  Will give B12 1 mg injection. -She will start taking B12 1 mg tablet daily.  Will repeat B12 in 8 weeks.  Orders placed this encounter:  Orders Placed This Encounter  Procedures  . CBC with Differential/Platelet  . Ferritin  . Folate  . Vitamin B12  . Iron and TIBC  . Lactate dehydrogenase     Derek Jack, MD [place] [phone]   I, Milinda Antis, am acting as a scribe for Dr. Sanda Linger.  I, Derek Jack MD, have reviewed the above documentation for accuracy and completeness, and I agree with the above.

## 2020-12-28 NOTE — Progress Notes (Signed)
Patient was assessed by Dr. Delton Coombes and labs have been reviewed.  Patient is okay to proceed with Vitamin B12 injection today. Primary nurse and pharmacy aware.

## 2020-12-28 NOTE — Patient Instructions (Signed)
[place] at [facility] Discharge Instructions  You were seen today by Dr. Delton Coombes. He went over your recent results. You received a vitamin B12 injection today; purchase vitamin B12 over the counter and take 1 mg daily. You will be scheduled to receive 2 iron infusions 1 week apart. Dr. Delton Coombes will see you back in 2 months for labs and follow up.   Thank you for choosing [place] at [facility] to provide your oncology and hematology care.  To afford each patient quality time with our provider, please arrive at least 15 minutes before your scheduled appointment time.   If you have a lab appointment with the [place] please come in thru the Main Entrance and check in at the main information desk  You need to re-schedule your appointment should you arrive 10 or more minutes late.  We strive to give you quality time with our providers, and arriving late affects you and other patients whose appointments are after yours.  Also, if you no show three or more times for appointments you may be dismissed from the clinic at the providers discretion.     Again, thank you for choosing [facility].  Our hope is that these requests will decrease the amount of time that you wait before being seen by our physicians.       _____________________________________________________________  Should you have questions after your visit to [facility], please contact our office at [phone] between the hours of 8:00 a.m. and 4:30 p.m.  Voicemails left after 4:00 p.m. will not be returned until the following business day.  For prescription refill requests, have your pharmacy contact our office and allow 72 hours.    Cancer Center Support Programs:   > Cancer Support Group  2nd Tuesday of the month 1pm-2pm, Journey Room

## 2020-12-28 NOTE — Progress Notes (Signed)
Annette Frank presents today for office visit and injection per the provider's orders.  Vitamin B-12 administration to Left Deltoid without incident; injection site WNL; see MAR for injection details.  Patient tolerated procedure well and remained stable during injection.  No questions or complaints noted at this time. Patient discharged ambulatory and remains in stable condition.

## 2020-12-30 ENCOUNTER — Inpatient Hospital Stay (HOSPITAL_COMMUNITY): Payer: Medicare Other

## 2020-12-30 ENCOUNTER — Other Ambulatory Visit: Payer: Self-pay

## 2020-12-30 VITALS — BP 137/64 | HR 62 | Temp 97.1°F | Resp 18

## 2020-12-30 DIAGNOSIS — D509 Iron deficiency anemia, unspecified: Secondary | ICD-10-CM

## 2020-12-30 MED ORDER — LORATADINE 10 MG PO TABS
10.0000 mg | ORAL_TABLET | Freq: Once | ORAL | Status: AC
  Administered 2020-12-30: 10 mg via ORAL

## 2020-12-30 MED ORDER — ONDANSETRON HCL 4 MG/2ML IJ SOLN
4.0000 mg | Freq: Once | INTRAMUSCULAR | Status: AC
  Administered 2020-12-30: 4 mg via INTRAVENOUS

## 2020-12-30 MED ORDER — LORATADINE 10 MG PO TABS
ORAL_TABLET | ORAL | Status: AC
  Filled 2020-12-30: qty 1

## 2020-12-30 MED ORDER — ONDANSETRON HCL 4 MG/2ML IJ SOLN
INTRAMUSCULAR | Status: AC
  Filled 2020-12-30: qty 2

## 2020-12-30 MED ORDER — SODIUM CHLORIDE 0.9 % IV SOLN: Freq: Once | INTRAVENOUS | Status: AC

## 2020-12-30 MED ORDER — SODIUM CHLORIDE 0.9 % IV SOLN
510.0000 mg | Freq: Once | INTRAVENOUS | Status: AC
  Administered 2020-12-30: 510 mg via INTRAVENOUS
  Filled 2020-12-30: qty 510

## 2020-12-30 MED ORDER — METHYLPREDNISOLONE SODIUM SUCC 125 MG IJ SOLR
125.0000 mg | Freq: Once | INTRAMUSCULAR | Status: AC
  Administered 2020-12-30: 125 mg via INTRAVENOUS

## 2020-12-30 MED ORDER — METHYLPREDNISOLONE SODIUM SUCC 125 MG IJ SOLR
INTRAMUSCULAR | Status: AC
  Filled 2020-12-30: qty 2

## 2020-12-30 NOTE — Progress Notes (Signed)
Patient presents today for Feraheme infusion.  Vital signs WNL.  Patients only complaint is fatigue.  Peripheral IV started and blood return noted prior to starting infusion.    Feraheme infusion given today per MD orders.  Stable during infusion without adverse affects.  Vital signs stable.  No complaints at this time.  Discharge from clinic ambulatory in stable condition.  Alert and oriented X 3.  Follow up with [facility] as scheduled.

## 2020-12-30 NOTE — Patient Instructions (Signed)
Ferumoxytol infusion What is this medicine? FERUMOXYTOL is an iron complex. Iron is used to make healthy red blood cells, which carry oxygen and nutrients throughout the body. This medicine is used to treat iron deficiency anemia. This medicine may be used for other purposes; ask your health care provider or pharmacist if you have questions. COMMON BRAND NAME(S): Feraheme What should I tell my health care provider before I take this medicine? They need to know if you have any of these conditions:  anemia not caused by low iron levels  high levels of iron in the blood  magnetic resonance imaging (MRI) test scheduled  an unusual or allergic reaction to iron, other medicines, foods, dyes, or preservatives  pregnant or trying to get pregnant  breast-feeding How should I use this medicine? This medicine is for injection into a vein. It is given by a health care professional in a hospital or clinic setting. Talk to your pediatrician regarding the use of this medicine in children. Special care may be needed. Overdosage: If you think you have taken too much of this medicine contact a poison control center or emergency room at once. NOTE: This medicine is only for you. Do not share this medicine with others. What if I miss a dose? It is important not to miss your dose. Call your doctor or health care professional if you are unable to keep an appointment. What may interact with this medicine? This medicine may interact with the following medications:  other iron products This list may not describe all possible interactions. Give your health care provider a list of all the medicines, herbs, non-prescription drugs, or dietary supplements you use. Also tell them if you smoke, drink alcohol, or use illegal drugs. Some items may interact with your medicine. What should I watch for while using this medicine? Visit your doctor or healthcare professional regularly. Tell your doctor or healthcare  professional if your symptoms do not start to get better or if they get worse. You may need blood work done while you are taking this medicine. You may need to follow a special diet. Talk to your doctor. Foods that contain iron include: whole grains/cereals, dried fruits, beans, or peas, leafy green vegetables, and organ meats (liver, kidney). What side effects may I notice from receiving this medicine? Side effects that you should report to your doctor or health care professional as soon as possible:  allergic reactions like skin rash, itching or hives, swelling of the face, lips, or tongue  breathing problems  changes in blood pressure  feeling faint or lightheaded, falls  fever or chills  flushing, sweating, or hot feelings  swelling of the ankles or feet Side effects that usually do not require medical attention (report to your doctor or health care professional if they continue or are bothersome):  diarrhea  headache  nausea, vomiting  stomach pain This list may not describe all possible side effects. Call your doctor for medical advice about side effects. You may report side effects to FDA at 1-800-FDA-1088. Where should I keep my medicine? This drug is given in a hospital or clinic and will not be stored at home. NOTE: This sheet is a summary. It may not cover all possible information. If you have questions about this medicine, talk to your doctor, pharmacist, or health care provider.  2021 Elsevier/Gold Standard (2016-12-03 20:21:10)  

## 2021-01-06 ENCOUNTER — Inpatient Hospital Stay (HOSPITAL_COMMUNITY): Payer: Medicare Other

## 2021-01-06 ENCOUNTER — Other Ambulatory Visit: Payer: Self-pay

## 2021-01-06 VITALS — BP 129/74 | HR 66 | Temp 98.0°F | Resp 18

## 2021-01-06 DIAGNOSIS — D509 Iron deficiency anemia, unspecified: Secondary | ICD-10-CM | POA: Diagnosis not present

## 2021-01-06 MED ORDER — LORATADINE 10 MG PO TABS
10.0000 mg | ORAL_TABLET | Freq: Once | ORAL | Status: AC
  Administered 2021-01-06: 10 mg via ORAL

## 2021-01-06 MED ORDER — METHYLPREDNISOLONE SODIUM SUCC 125 MG IJ SOLR
INTRAMUSCULAR | Status: AC
  Filled 2021-01-06: qty 2

## 2021-01-06 MED ORDER — ONDANSETRON HCL 4 MG/2ML IJ SOLN
4.0000 mg | Freq: Once | INTRAMUSCULAR | Status: AC
  Administered 2021-01-06: 4 mg via INTRAVENOUS

## 2021-01-06 MED ORDER — SODIUM CHLORIDE 0.9 % IV SOLN
510.0000 mg | Freq: Once | INTRAVENOUS | Status: AC
  Administered 2021-01-06: 510 mg via INTRAVENOUS
  Filled 2021-01-06: qty 510

## 2021-01-06 MED ORDER — LORATADINE 10 MG PO TABS
ORAL_TABLET | ORAL | Status: AC
  Filled 2021-01-06: qty 1

## 2021-01-06 MED ORDER — ONDANSETRON HCL 4 MG/2ML IJ SOLN
INTRAMUSCULAR | Status: AC
  Filled 2021-01-06: qty 2

## 2021-01-06 MED ORDER — SODIUM CHLORIDE 0.9 % IV SOLN: INTRAVENOUS | Status: DC

## 2021-01-06 MED ORDER — METHYLPREDNISOLONE SODIUM SUCC 125 MG IJ SOLR
125.0000 mg | Freq: Once | INTRAMUSCULAR | Status: AC
  Administered 2021-01-06: 125 mg via INTRAVENOUS

## 2021-01-06 NOTE — Patient Instructions (Signed)
Ferumoxytol infusion What is this medicine? FERUMOXYTOL is an iron complex. Iron is used to make healthy red blood cells, which carry oxygen and nutrients throughout the body. This medicine is used to treat iron deficiency anemia. This medicine may be used for other purposes; ask your health care provider or pharmacist if you have questions. COMMON BRAND NAME(S): Feraheme What should I tell my health care provider before I take this medicine? They need to know if you have any of these conditions:  anemia not caused by low iron levels  high levels of iron in the blood  magnetic resonance imaging (MRI) test scheduled  an unusual or allergic reaction to iron, other medicines, foods, dyes, or preservatives  pregnant or trying to get pregnant  breast-feeding How should I use this medicine? This medicine is for injection into a vein. It is given by a health care professional in a hospital or clinic setting. Talk to your pediatrician regarding the use of this medicine in children. Special care may be needed. Overdosage: If you think you have taken too much of this medicine contact a poison control center or emergency room at once. NOTE: This medicine is only for you. Do not share this medicine with others. What if I miss a dose? It is important not to miss your dose. Call your doctor or health care professional if you are unable to keep an appointment. What may interact with this medicine? This medicine may interact with the following medications:  other iron products This list may not describe all possible interactions. Give your health care provider a list of all the medicines, herbs, non-prescription drugs, or dietary supplements you use. Also tell them if you smoke, drink alcohol, or use illegal drugs. Some items may interact with your medicine. What should I watch for while using this medicine? Visit your doctor or healthcare professional regularly. Tell your doctor or healthcare  professional if your symptoms do not start to get better or if they get worse. You may need blood work done while you are taking this medicine. You may need to follow a special diet. Talk to your doctor. Foods that contain iron include: whole grains/cereals, dried fruits, beans, or peas, leafy green vegetables, and organ meats (liver, kidney). What side effects may I notice from receiving this medicine? Side effects that you should report to your doctor or health care professional as soon as possible:  allergic reactions like skin rash, itching or hives, swelling of the face, lips, or tongue  breathing problems  changes in blood pressure  feeling faint or lightheaded, falls  fever or chills  flushing, sweating, or hot feelings  swelling of the ankles or feet Side effects that usually do not require medical attention (report to your doctor or health care professional if they continue or are bothersome):  diarrhea  headache  nausea, vomiting  stomach pain This list may not describe all possible side effects. Call your doctor for medical advice about side effects. You may report side effects to FDA at 1-800-FDA-1088. Where should I keep my medicine? This drug is given in a hospital or clinic and will not be stored at home. NOTE: This sheet is a summary. It may not cover all possible information. If you have questions about this medicine, talk to your doctor, pharmacist, or health care provider.  2021 Elsevier/Gold Standard (2016-12-03 20:21:10)

## 2021-01-06 NOTE — Progress Notes (Signed)
Patient presents today for Feraheme infusion.  Vital signs WNL.  Patient has no new complaints since last visit.  Peripheral IV started, good blood return noted before and after infusion.    Feraheme infusion given today per MD orders.  Stable during infusion without adverse affects.  Vital signs stable.  No complaints at this time.  Discharge from clinic ambulatory in stable condition.  Alert and oriented X 3.  Follow up with [facility] as scheduled.

## 2021-01-18 ENCOUNTER — Other Ambulatory Visit (HOSPITAL_COMMUNITY): Payer: Self-pay | Admitting: *Deleted

## 2021-01-18 ENCOUNTER — Encounter (HOSPITAL_COMMUNITY): Payer: Self-pay

## 2021-01-19 ENCOUNTER — Encounter (HOSPITAL_COMMUNITY): Payer: Self-pay

## 2021-01-19 ENCOUNTER — Other Ambulatory Visit (HOSPITAL_COMMUNITY): Payer: Self-pay

## 2021-01-20 ENCOUNTER — Other Ambulatory Visit (HOSPITAL_COMMUNITY): Payer: Self-pay | Admitting: Physician Assistant

## 2021-01-20 ENCOUNTER — Other Ambulatory Visit (HOSPITAL_COMMUNITY): Payer: Self-pay

## 2021-01-20 ENCOUNTER — Other Ambulatory Visit (HOSPITAL_COMMUNITY): Payer: Self-pay | Admitting: Hematology

## 2021-01-20 ENCOUNTER — Encounter (HOSPITAL_COMMUNITY): Payer: Self-pay

## 2021-01-20 DIAGNOSIS — R197 Diarrhea, unspecified: Secondary | ICD-10-CM

## 2021-01-20 DIAGNOSIS — D649 Anemia, unspecified: Secondary | ICD-10-CM

## 2021-01-20 DIAGNOSIS — D509 Iron deficiency anemia, unspecified: Secondary | ICD-10-CM

## 2021-01-20 MED ORDER — DIPHENOXYLATE-ATROPINE 2.5-0.025 MG PO TABS: 2.0000 | ORAL_TABLET | Freq: Four times a day (QID) | ORAL | 1 refills | Status: DC | PRN

## 2021-01-20 NOTE — Progress Notes (Signed)
c 

## 2021-02-23 ENCOUNTER — Other Ambulatory Visit: Payer: Self-pay

## 2021-02-23 ENCOUNTER — Inpatient Hospital Stay (HOSPITAL_COMMUNITY): Payer: Medicare Other | Attending: Hematology

## 2021-02-23 DIAGNOSIS — D509 Iron deficiency anemia, unspecified: Secondary | ICD-10-CM | POA: Insufficient documentation

## 2021-02-23 LAB — LACTATE DEHYDROGENASE: LDH: 145 U/L (ref 98–192)

## 2021-02-23 LAB — IRON AND TIBC
Iron: 67 ug/dL (ref 28–170)
Saturation Ratios: 18 % (ref 10.4–31.8)
TIBC: 369 ug/dL (ref 250–450)
UIBC: 302 ug/dL

## 2021-02-23 LAB — CBC WITH DIFFERENTIAL/PLATELET
Abs Immature Granulocytes: 0.02 10*3/uL (ref 0.00–0.07)
Basophils Absolute: 0.1 10*3/uL (ref 0.0–0.1)
Basophils Relative: 1 %
Eosinophils Absolute: 0.7 10*3/uL — ABNORMAL HIGH (ref 0.0–0.5)
Eosinophils Relative: 10 %
HCT: 45.6 % (ref 36.0–46.0)
Hemoglobin: 14 g/dL (ref 12.0–15.0)
Immature Granulocytes: 0 %
Lymphocytes Relative: 15 %
Lymphs Abs: 1.1 10*3/uL (ref 0.7–4.0)
MCH: 27.5 pg (ref 26.0–34.0)
MCHC: 30.7 g/dL (ref 30.0–36.0)
MCV: 89.4 fL (ref 80.0–100.0)
Monocytes Absolute: 0.7 10*3/uL (ref 0.1–1.0)
Monocytes Relative: 11 %
Neutro Abs: 4.4 10*3/uL (ref 1.7–7.7)
Neutrophils Relative %: 63 %
Platelets: 220 10*3/uL (ref 150–400)
RBC: 5.1 MIL/uL (ref 3.87–5.11)
RDW: 18 % — ABNORMAL HIGH (ref 11.5–15.5)
WBC: 7 10*3/uL (ref 4.0–10.5)
nRBC: 0 % (ref 0.0–0.2)

## 2021-02-23 LAB — VITAMIN B12: Vitamin B-12: 669 pg/mL (ref 180–914)

## 2021-02-23 LAB — FERRITIN: Ferritin: 97 ng/mL (ref 11–307)

## 2021-02-23 LAB — FOLATE: Folate: 14.7 ng/mL (ref >5.9)

## 2021-03-02 ENCOUNTER — Encounter (HOSPITAL_COMMUNITY): Payer: Self-pay | Admitting: Hematology

## 2021-03-02 ENCOUNTER — Other Ambulatory Visit: Payer: Self-pay

## 2021-03-02 ENCOUNTER — Inpatient Hospital Stay (HOSPITAL_COMMUNITY): Payer: Medicare Other | Attending: Medical | Admitting: Hematology

## 2021-03-02 VITALS — BP 144/66 | HR 76 | Temp 98.3°F | Resp 19 | Wt 218.9 lb

## 2021-03-02 DIAGNOSIS — D528 Other folate deficiency anemias: Secondary | ICD-10-CM | POA: Diagnosis not present

## 2021-03-02 DIAGNOSIS — Z79899 Other long term (current) drug therapy: Secondary | ICD-10-CM | POA: Diagnosis not present

## 2021-03-02 DIAGNOSIS — E538 Deficiency of other specified B group vitamins: Secondary | ICD-10-CM | POA: Diagnosis present

## 2021-03-02 DIAGNOSIS — D518 Other vitamin B12 deficiency anemias: Secondary | ICD-10-CM

## 2021-03-02 DIAGNOSIS — D509 Iron deficiency anemia, unspecified: Secondary | ICD-10-CM | POA: Diagnosis present

## 2021-03-02 NOTE — Patient Instructions (Signed)
[place] at [facility] Discharge Instructions  You were seen today by Dr. Delton Coombes. He went over your recent results. If your energy levels drop, please call the office to be seen before your next office visit. Dr. Delton Coombes will see you back in 3 months for labs and follow up.   Thank you for choosing [place] at [facility] to provide your oncology and hematology care.  To afford each patient quality time with our provider, please arrive at least 15 minutes before your scheduled appointment time.   If you have a lab appointment with the [place] please come in thru the Main Entrance and check in at the main information desk  You need to re-schedule your appointment should you arrive 10 or more minutes late.  We strive to give you quality time with our providers, and arriving late affects you and other patients whose appointments are after yours.  Also, if you no show three or more times for appointments you may be dismissed from the clinic at the providers discretion.     Again, thank you for choosing [facility].  Our hope is that these requests will decrease the amount of time that you wait before being seen by our physicians.       _____________________________________________________________  Should you have questions after your visit to [facility], please contact our office at [phone] between the hours of 8:00 a.m. and 4:30 p.m.  Voicemails left after 4:00 p.m. will not be returned until the following business day.  For prescription refill requests, have your pharmacy contact our office and allow 72 hours.    Cancer Center Support Programs:   > Cancer Support Group  2nd Tuesday of the month 1pm-2pm, Journey Room

## 2021-03-02 NOTE — Progress Notes (Signed)
Annette Frank, [place] [address]   CLINIC:  Medical Oncology/Hematology  PCP:  Thea Alken [address] / [place] [address]  [phone]  REASON FOR VISIT:  Follow-up for IDA  PRIOR THERAPY: Robotic assisted hiatal hernia repair on 11/04/2020  CURRENT THERAPY: Intermittent Feraheme last on 01/06/2021  INTERVAL HISTORY:  [name], a 70 y.o. female, returns for routine follow-up for her IDA. Misa was last seen on 12/28/2020.  Today she reports feeling okay. Her energy levels are improving and she denies having melena, hematochezia or hematuria. She reports that she had an episode of diarrhea after the Feraheme on 12/30/2020, then had 3 weeks of nausea and diarrhea after the Feraheme on 03/11. She still has occasional diarrhea, about 3 BM's per day, though it is slowing down since she is taking Lomotil. She reports having some diarrhea and joint aches after her previous Feraheme infusions. She denies having any ice pica. She has also tried cutting down on eating greasy foods since her cholecystectomy.   REVIEW OF SYSTEMS:  Review of Systems  Constitutional: Positive for appetite change and fatigue (50%; improving).  Gastrointestinal: Positive for diarrhea (occasional after Feraheme). Negative for blood in stool and nausea.  Genitourinary: Negative for hematuria.   All other systems reviewed and are negative.   PAST MEDICAL/SURGICAL HISTORY:  Past Medical History:  Diagnosis Date  . Anemia   . Arthritis   . Asthma   . 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 Dysphagia   . Heartburn   . Hiatal hernia   . Hypothyroidism   . Obesity   . Thyroid disease    Past Surgical History:  Procedure Laterality Date  . ABDOMINAL HYSTERECTOMY    . BIOPSY  05/25/2020   Procedure: BIOPSY;  Surgeon: Harvel Quale, MD;  [facility];  Service: Gastroenterology;;  duodenum  . COLONOSCOPY WITH PROPOFOL N/A  05/25/2020   Procedure: COLONOSCOPY WITH PROPOFOL;  Surgeon: Harvel Quale, MD;  Location: [facility];  Service: Gastroenterology;  Laterality: N/A;  1045  . ESOPHAGOGASTRODUODENOSCOPY (EGD) WITH PROPOFOL N/A 05/25/2020   Procedure: ESOPHAGOGASTRODUODENOSCOPY (EGD) WITH PROPOFOL;  Surgeon: Harvel Quale, MD;  Location: [facility];  Service: Gastroenterology;  Laterality: N/A;  . LUMBAR LAMINECTOMY N/A 2001  . MEDIAL PARTIAL KNEE REPLACEMENT Bilateral 2009 & 2015  . POLYPECTOMY  05/25/2020   Procedure: POLYPECTOMY;  Surgeon: Harvel Quale, MD;  Location: [facility];  Service: Gastroenterology;;  colon  . TOTAL HIP ARTHROPLASTY Right 2014  . UMBILICAL HERNIA REPAIR N/A 1955  . XI ROBOTIC ASSISTED HIATAL HERNIA REPAIR N/A 11/04/2020   Procedure: ROBOTIC REPAIR OF PARAESOPHAGEAL HIATAL HERNIA WITH TOUPET FUNDOPLICATION WITH MESH, ROBOTIC CHOLECYSTECTOMY, PARTIAL OMENTECTOMY, BILATERAL TAP BLOCK;  Surgeon: Michael Boston, MD;  Location: [facility];  Service: General;  Laterality: N/A;    SOCIAL HISTORY:  Social History   Socioeconomic History  . Marital status: Married    Spouse name: Not on file  . Number of children: 2  . Years of education: Not on file  . Highest education level: Not on file  Occupational History  . Occupation: RETIRED  Tobacco Use  . Smoking status: Never Smoker  . Smokeless tobacco: Never Used  Vaping Use  . Vaping Use: Never used  Substance and Sexual Activity  . Alcohol use: Never  . Drug use: Not Currently  . Sexual activity: Not Currently  Other Topics  Concern  . Not on file  Social History Narrative  . Not on file   Social Determinants of Health   Financial Resource Strain: Not on file  Food Insecurity: Not on file  Transportation Needs: Not on file  Physical Activity: Not on file  Stress: Not on file  Social Connections: Not on file  Intimate Partner Violence: Not on file    FAMILY HISTORY:  Family History   Problem Relation Age of Onset  . Hypertension Mother   . Cancer Mother        Endometrial cancer  . Hypothyroidism Father   . Arthritis Father   . Hypertension Brother   . Heart attack Maternal Grandfather   . Hypertension Son   . Healthy Son     CURRENT MEDICATIONS:  Current Outpatient Medications  Medication Sig Dispense Refill  . acetaminophen (TYLENOL) 500 MG tablet Take 1,000 mg by mouth every 6 (six) hours as needed for moderate pain.    Marland Kitchen albuterol (VENTOLIN HFA) 108 (90 Base) MCG/ACT inhaler Inhale 1-2 puffs into the lungs every 6 (six) hours as needed for wheezing or shortness of breath.    Marland Kitchen atenolol (TENORMIN) 25 MG tablet Take 25 mg by mouth daily.     Marland Kitchen azelastine (ASTELIN) 0.1 % nasal spray Place 1 spray into both nostrils at bedtime.    . cephALEXin (KEFLEX) 500 MG capsule Take 2,000 mg by mouth See admin instructions. Before dental procedures    . diphenoxylate-atropine (LOMOTIL) 2.5-0.025 MG tablet Take 2 tablets by mouth 4 (four) times daily as needed for diarrhea or loose stools. 30 tablet 1  . fluticasone (FLONASE) 50 MCG/ACT nasal spray Place 1 spray into both nostrils daily as needed for allergies.     Marland Kitchen ibuprofen (ADVIL) 200 MG tablet Take 400 mg by mouth every 8 (eight) hours as needed for moderate pain.    Marland Kitchen levothyroxine (SYNTHROID) 100 MCG tablet Take 100 mcg by mouth daily before breakfast.    . ondansetron (ZOFRAN) 4 MG tablet Take 1 tablet (4 mg total) by mouth every 8 (eight) hours as needed for nausea. 8 tablet 5  . Vitamin D, Ergocalciferol, (DRISDOL) 1.25 MG (50000 UT) CAPS capsule Take 50,000 Units by mouth once a week.     No current facility-administered medications for this visit.    ALLERGIES:  Allergies  Allergen Reactions  . Cefuroxime Axetil Nausea And Vomiting  . Skelaxin [Metaxalone] Nausea And Vomiting  . Codeine Palpitations  . Tincture Of Benzoin [Benzoin] Rash    PHYSICAL EXAM:  Performance status (ECOG): 1 - Symptomatic but  completely ambulatory  Vitals:   03/02/21 1529  BP: (!) 144/66  Pulse: 76  Resp: 19  Temp: 98.3 F (36.8 C)  SpO2: 99%   Wt Readings from Last 3 Encounters:  03/02/21 218 lb 14.7 oz (99.3 kg)  12/28/20 220 lb (99.8 kg)  11/16/20 219 lb 3.2 oz (99.4 kg)   Physical Exam Vitals reviewed.  Constitutional:      Appearance: Normal appearance. She is obese.  Cardiovascular:     Rate and Rhythm: Normal rate and regular rhythm.     Pulses: Normal pulses.     Heart sounds: Normal heart sounds.  Pulmonary:     Effort: Pulmonary effort is normal.     Breath sounds: Normal breath sounds.  Chest:     Chest wall: Tenderness (L lower lateral rib wall TTP) present.  Musculoskeletal:     Right lower leg: No edema.  Left lower leg: No edema.  Neurological:     General: No focal deficit present.     Mental Status: She is alert and oriented to person, place, and time.  Psychiatric:        Mood and Affect: Mood normal.        Behavior: Behavior normal.     LABORATORY DATA:  I have reviewed the labs as listed.  CBC Latest Ref Rng & Units 02/23/2021 11/11/2020 11/06/2020  WBC 4.0 - 10.5 K/uL 7.0 8.6 -  Hemoglobin 12.0 - 15.0 g/dL 14.0 9.0(L) 8.1(L)  Hematocrit 36.0 - 46.0 % 45.6 32.8(L) -  Platelets 150 - 400 K/uL 220 297 -   CMP Latest Ref Rng & Units 11/11/2020 11/06/2020 11/05/2020  Glucose 70 - 99 mg/dL 121(H) - 135(H)  BUN 8 - 23 mg/dL 11 - 13  Creatinine 0.44 - 1.00 mg/dL 0.84 - 0.88  Sodium 135 - 145 mmol/L 139 - 139  Potassium 3.5 - 5.1 mmol/L 4.2 4.6 4.8  Chloride 98 - 111 mmol/L 104 - 105  CO2 22 - 32 mmol/L 25 - 23  Calcium 8.9 - 10.3 mg/dL 9.2 - 9.5  Total Protein 6.5 - 8.1 g/dL 6.7 - -  Total Bilirubin 0.3 - 1.2 mg/dL 0.5 - -  Alkaline Phos 38 - 126 U/L 80 - -  AST 15 - 41 U/L 15 - -  ALT 0 - 44 U/L 42 - -      Component Value Date/Time   RBC 5.10 02/23/2021 1244   MCV 89.4 02/23/2021 1244   MCH 27.5 02/23/2021 1244   MCHC 30.7 02/23/2021 1244   RDW 18.0 (H)  02/23/2021 1244   LYMPHSABS 1.1 02/23/2021 1244   MONOABS 0.7 02/23/2021 1244   EOSABS 0.7 (H) 02/23/2021 1244   BASOSABS 0.1 02/23/2021 1244   Lab Results  Component Value Date   LDH 145 02/23/2021   LDH 126 08/23/2020   LDH 119 08/05/2020   Lab Results  Component Value Date   TIBC 369 02/23/2021   TIBC 452 (H) 12/22/2020   TIBC 388 11/16/2020   FERRITIN 97 02/23/2021   FERRITIN 13 12/22/2020   FERRITIN 138 11/16/2020   IRONPCTSAT 18 02/23/2021   IRONPCTSAT 8 (L) 12/22/2020   IRONPCTSAT 7 (L) 11/16/2020    DIAGNOSTIC IMAGING:  I have independently reviewed the scans and discussed with the patient. No results found.   ASSESSMENT:  1. Iron deficiency anemia: -Originally noted in 2010 worked up by gastroenterology with endoscopy and colonoscopy. -Anemia thought to be secondary to duodenal ulcers and poor absorption. -Anemia continued, repeated endoscopy and colonoscopy in 2015 did not reveal any evidence of bleeding. Anemia thought to be secondary to poor absorption. -Repeat endoscopy and colonoscopy in 2019 did not reveal any evidence of bleeding. -Bone marrow biopsy completed in September 2020 showed normal marrow. No evidence of hematologic neoplasm or morphology. Normal cytogenetics. No increase in blasts on flow cytometry. -Patient is unable to tolerate p.o. iron supplements due to GI upset. However she is continuing to take iron tablets on and off. -Patient is unable to tolerate Venofer due to flulike symptoms. She does not respond to IV Ferrlecit. -Patient has been extensively worked up for her anemia. It appears anemia is most likely secondary to poor GI absorption. -Work-up labs done on 12/03/2019 showed SPEP negative. Creatinine 0.83, vitamin B12 221, WBC 6.5, hemoglobin 10.1, platelets 254. -Patient reports she felt better after her Injectafer infusions but is starting to feel fatigued again. -Last  Feraheme infusions were on 04/21/2020, 05/17/2020, 05/05/2020 with  premeds -We sent her to GI she had her colonoscopy on 05/25/2020 which showed examined portion of the ileum was normal. A 4 mm polyp in the transverse colon, removed. Diverticulosis in the sigmoid colon. Also found a 2 mm polyp in the descending colon, removed. The distal rectum and anal verge are normal on retroflexion view. -Endoscopy on 05/25/2020 showed normal esophagus. 5 cm hiatal hernia. [place] lesions. Erosive gastropathy with no stigmata of recent bleeding, biopsied. Normal examined duodenum. -Also showed on exam a large gallstone. She is scheduled to have surgery to remove her gallstone. GI suggests that the [facility] lesions were the cause of her anemia.   PLAN:  1. Iron deficiency anemia: -She denies any bleeding per rectum or melena. - She has received Feraheme on 12/30/2020 and 01/06/2021. - She has experienced diarrhea for up to 3 weeks after last Feraheme.  She had received Feraheme during last year but had mild diarrhea.  She also developed some diarrhea since cholecystectomy.  She thinks the combination of cholecystectomy diarrhea and Feraheme would have caused worsening of diarrhea.  She has about 2-3 stools per day at this time. - We reviewed labs from 02/23/2021.  Ferritin is 97 and percent saturation 18.  Hemoglobin improved 14. - Her energy levels are really good.  No indication for parenteral iron therapy.  RTC 12 weeks for follow-up.  2.  Vitamin B12 deficiency: -Continue vitamin B12 1 mg tablet daily.  Vitamin B12 level is 669.  Orders placed this encounter:  No orders of the defined types were placed in this encounter.    Derek Jack, MD [place] [phone]   I, Milinda Antis, am acting as a scribe for Dr. Sanda Linger.  I, Derek Jack MD, have reviewed the above documentation for accuracy and completeness, and I agree with the above.

## 2021-05-29 DEATH — deceased

## 2021-05-30 ENCOUNTER — Encounter (HOSPITAL_COMMUNITY): Payer: Self-pay | Admitting: Hematology

## 2021-05-31 ENCOUNTER — Inpatient Hospital Stay (HOSPITAL_COMMUNITY): Payer: Medicare Other | Attending: Hematology

## 2021-05-31 ENCOUNTER — Other Ambulatory Visit: Payer: Self-pay

## 2021-05-31 DIAGNOSIS — D509 Iron deficiency anemia, unspecified: Secondary | ICD-10-CM | POA: Diagnosis present

## 2021-05-31 DIAGNOSIS — J45909 Unspecified asthma, uncomplicated: Secondary | ICD-10-CM | POA: Diagnosis not present

## 2021-05-31 DIAGNOSIS — D528 Other folate deficiency anemias: Secondary | ICD-10-CM

## 2021-05-31 DIAGNOSIS — Z79899 Other long term (current) drug therapy: Secondary | ICD-10-CM | POA: Insufficient documentation

## 2021-05-31 DIAGNOSIS — E538 Deficiency of other specified B group vitamins: Secondary | ICD-10-CM | POA: Diagnosis not present

## 2021-05-31 DIAGNOSIS — D518 Other vitamin B12 deficiency anemias: Secondary | ICD-10-CM

## 2021-05-31 DIAGNOSIS — E039 Hypothyroidism, unspecified: Secondary | ICD-10-CM | POA: Diagnosis present

## 2021-05-31 LAB — CBC WITH DIFFERENTIAL/PLATELET
Abs Immature Granulocytes: 0.02 10*3/uL (ref 0.00–0.07)
Basophils Absolute: 0 10*3/uL (ref 0.0–0.1)
Basophils Relative: 0 %
Eosinophils Absolute: 0.2 10*3/uL (ref 0.0–0.5)
Eosinophils Relative: 2 %
HCT: 44.4 % (ref 36.0–46.0)
Hemoglobin: 14.4 g/dL (ref 12.0–15.0)
Immature Granulocytes: 0 %
Lymphocytes Relative: 16 %
Lymphs Abs: 1.2 10*3/uL (ref 0.7–4.0)
MCH: 30.8 pg (ref 26.0–34.0)
MCHC: 32.4 g/dL (ref 30.0–36.0)
MCV: 94.9 fL (ref 80.0–100.0)
Monocytes Absolute: 0.6 10*3/uL (ref 0.1–1.0)
Monocytes Relative: 9 %
Neutro Abs: 5.4 10*3/uL (ref 1.7–7.7)
Neutrophils Relative %: 73 %
Platelets: 203 10*3/uL (ref 150–400)
RBC: 4.68 MIL/uL (ref 3.87–5.11)
RDW: 13.4 % (ref 11.5–15.5)
WBC: 7.4 10*3/uL (ref 4.0–10.5)
nRBC: 0 % (ref 0.0–0.2)

## 2021-05-31 LAB — IRON AND TIBC
Iron: 78 ug/dL (ref 28–170)
Saturation Ratios: 20 % (ref 10.4–31.8)
TIBC: 390 ug/dL (ref 250–450)
UIBC: 312 ug/dL

## 2021-05-31 LAB — VITAMIN B12: Vitamin B-12: 311 pg/mL (ref 180–914)

## 2021-05-31 LAB — FOLATE: Folate: 13.9 ng/mL (ref >5.9)

## 2021-05-31 LAB — FERRITIN: Ferritin: 58 ng/mL (ref 11–307)

## 2021-05-31 LAB — LACTATE DEHYDROGENASE: LDH: 132 U/L (ref 98–192)

## 2021-06-07 ENCOUNTER — Other Ambulatory Visit: Payer: Self-pay

## 2021-06-07 ENCOUNTER — Ambulatory Visit (HOSPITAL_COMMUNITY): Payer: Medicare Other | Admitting: Hematology

## 2021-06-07 ENCOUNTER — Inpatient Hospital Stay (HOSPITAL_BASED_OUTPATIENT_CLINIC_OR_DEPARTMENT_OTHER): Payer: Medicare Other | Admitting: Oncology

## 2021-06-07 VITALS — BP 132/73 | HR 67 | Temp 97.0°F | Resp 18 | Wt 226.6 lb

## 2021-06-07 DIAGNOSIS — D509 Iron deficiency anemia, unspecified: Secondary | ICD-10-CM | POA: Diagnosis not present

## 2021-06-07 DIAGNOSIS — D518 Other vitamin B12 deficiency anemias: Secondary | ICD-10-CM | POA: Diagnosis not present

## 2021-06-07 DIAGNOSIS — E039 Hypothyroidism, unspecified: Secondary | ICD-10-CM | POA: Diagnosis not present

## 2021-06-07 MED ORDER — CYANOCOBALAMIN 1000 MCG/ML IJ SOLN
1000.0000 ug | Freq: Once | INTRAMUSCULAR | Status: AC
  Administered 2021-06-07: 1000 ug via INTRAMUSCULAR
  Filled 2021-06-07: qty 1

## 2021-06-07 NOTE — Progress Notes (Signed)
[place], [place] [address]   CLINIC:  Medical Oncology/Hematology  PCP:  Thea Alken [address] / [place] [address]  [phone]  REASON FOR VISIT:  Follow-up for IDA  PRIOR THERAPY: Robotic assisted hiatal hernia repair on 11/04/2020  CURRENT THERAPY: Intermittent Feraheme last on 01/06/2021  I connected with Annette Frank on 06/07/21 at 10:00 AM EDT by video enabled telemedicine visit and verified that I am speaking with the correct person using two identifiers.   I discussed the limitations, risks, security and privacy concerns of performing an evaluation and management service by telemedicine and the availability of in-person appointments. I also discussed with the patient that there may be a patient responsible charge related to this service. The patient expressed understanding and agreed to proceed.   Other persons participating in the visit and their role in the encounter: RN, NP and patient    Patient's location: [facility]  Provider's location: [facility]   INTERVAL HISTORY:  Annette Frank, a 70 y.o. female, returns for routine follow-up for her IDA. Annette Frank was last seen on 03/02/2021.  She did not require any additional iron at her last visit.  Today she reports feeling well.  Reports having hot flashes and some joint pain. Not as unsteady on her feet. Less Sob. Not taking iron supplements. Takes a multivitamin that has iron in it.  Takes B12 and vit d as instructed.  Still having some diarrhea but reports it has improved.  States she eats a lot of carbs because meats increase frequency of her diarrhea (> 4 episodes mainly at nighttime and first thing in the morning).  Reports poor tolerance of IV iron infusions in the past requiring Lomotil for worsening diarrhea.  She also takes Pepto-Bismol.  Reports no abdominal pain but has cramping right before bowel movement.  Denies any bleeding.     REVIEW OF SYSTEMS:  Review of  Systems  Constitutional:  Positive for fatigue.  Respiratory:  Positive for shortness of breath (Exertional).   Gastrointestinal:  Positive for diarrhea.       Abdominal cramping prior to [facility]  Musculoskeletal:  Positive for myalgias.   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 leg   Dysphagia    Heartburn    Hiatal hernia    Hypothyroidism    Obesity    Thyroid disease    Past Surgical History:  Procedure Laterality Date   ABDOMINAL HYSTERECTOMY     BIOPSY  05/25/2020   Procedure: BIOPSY;  Surgeon: Harvel Quale, MD;  Location: [facility];  Service: Gastroenterology;;  duodenum   COLONOSCOPY WITH PROPOFOL N/A 05/25/2020   Procedure: COLONOSCOPY WITH PROPOFOL;  Surgeon: Harvel Quale, MD;  Location: [facility];  Service: Gastroenterology;  Laterality: N/A;  1045   ESOPHAGOGASTRODUODENOSCOPY (EGD) WITH PROPOFOL N/A 05/25/2020   Procedure: ESOPHAGOGASTRODUODENOSCOPY (EGD) WITH PROPOFOL;  Surgeon: Harvel Quale, MD;  Location: [facility];  Service: Gastroenterology;  Laterality: N/A;   LUMBAR LAMINECTOMY N/A 2001   MEDIAL PARTIAL KNEE REPLACEMENT Bilateral 2009 & 2015   POLYPECTOMY  05/25/2020   Procedure: POLYPECTOMY;  Surgeon: Harvel Quale, MD;  Location: [facility];  Service: Gastroenterology;;  colon   TOTAL HIP ARTHROPLASTY Right 8022   UMBILICAL HERNIA REPAIR N/A 1955   XI ROBOTIC ASSISTED  HIATAL HERNIA REPAIR N/A 11/04/2020   Procedure: ROBOTIC REPAIR OF PARAESOPHAGEAL HIATAL HERNIA WITH TOUPET FUNDOPLICATION WITH MESH, ROBOTIC CHOLECYSTECTOMY, PARTIAL OMENTECTOMY, BILATERAL TAP BLOCK;  Surgeon: Michael Boston, MD;  Location: [facility];  Service: General;  Laterality: N/A;    SOCIAL HISTORY:  Social History   Socioeconomic History   Marital status: Married    Spouse name: Not on file   Number of children: 2   Years of  education: Not on file   Highest education level: Not on file  Occupational History   Occupation: RETIRED  Tobacco Use   Smoking status: Never   Smokeless tobacco: Never  Vaping Use   Vaping Use: Never used  Substance and Sexual Activity   Alcohol use: Never   Drug use: Not Currently   Sexual activity: Not Currently  Other Topics Concern   Not on file  Social History Narrative   Not on file   Social Determinants of Health   Financial Resource Strain: Not on file  Food Insecurity: Not on file  Transportation Needs: Not on file  Physical Activity: Not on file  Stress: Not on file  Social Connections: Not on file  Intimate Partner Violence: Not on file    FAMILY HISTORY:  Family History  Problem Relation Age of Onset   Hypertension Mother    Cancer Mother        Endometrial cancer   Hypothyroidism Father    Arthritis Father    Hypertension Brother    Heart attack Maternal Grandfather    Hypertension Son    Healthy Son     CURRENT MEDICATIONS:  Current Outpatient Medications  Medication Sig Dispense Refill   acetaminophen (TYLENOL) 500 MG tablet Take 1,000 mg by mouth every 6 (six) hours as needed for moderate pain.     albuterol (VENTOLIN HFA) 108 (90 Base) MCG/ACT inhaler Inhale 1-2 puffs into the lungs every 6 (six) hours as needed for wheezing or shortness of breath.     atenolol (TENORMIN) 25 MG tablet Take 25 mg by mouth daily.      azelastine (ASTELIN) 0.1 % nasal spray Place 1 spray into both nostrils at bedtime.     cephALEXin (KEFLEX) 500 MG capsule Take 2,000 mg by mouth See admin instructions. Before dental procedures     fluticasone (FLONASE) 50 MCG/ACT nasal spray Place 1 spray into both nostrils daily as needed for allergies.      ibuprofen (ADVIL) 200 MG tablet Take 400 mg by mouth every 8 (eight) hours as needed for moderate pain.     levothyroxine (SYNTHROID) 100 MCG tablet Take 100 mcg by mouth daily before breakfast.     vitamin B-12  (CYANOCOBALAMIN) 100 MCG tablet Take 100 mcg by mouth daily.     Vitamin D, Ergocalciferol, (DRISDOL) 1.25 MG (50000 UT) CAPS capsule Take 50,000 Units by mouth once a week.     No current facility-administered medications for this visit.    ALLERGIES:  Allergies  Allergen Reactions   Cefuroxime Axetil Nausea And Vomiting   Skelaxin [Metaxalone] Nausea And Vomiting   Codeine Palpitations   Tincture Of Benzoin [Benzoin] Rash    PHYSICAL EXAM:  Performance status (ECOG): 1 - Symptomatic but completely ambulatory  There were no vitals filed for this visit.  Wt Readings from Last 3 Encounters:  03/02/21 218 lb 14.7 oz (99.3 kg)  12/28/20 220 lb (99.8 kg)  11/16/20 219 lb 3.2 oz (99.4 kg)   Physical Exam Constitutional:  Appearance: Normal appearance. She is obese.  Pulmonary:     Effort: Pulmonary effort is normal.  Neurological:     Mental Status: She is alert and oriented to person, place, and time.    LABORATORY DATA:  I have reviewed the labs as listed.  CBC Latest Ref Rng & Units 05/31/2021 02/23/2021 11/11/2020  WBC 4.0 - 10.5 K/uL 7.4 7.0 8.6  Hemoglobin 12.0 - 15.0 g/dL 14.4 14.0 9.0(L)  Hematocrit 36.0 - 46.0 % 44.4 45.6 32.8(L)  Platelets 150 - 400 K/uL 203 220 297   CMP Latest Ref Rng & Units 11/11/2020 11/06/2020 11/05/2020  Glucose 70 - 99 mg/dL 121(H) - 135(H)  BUN 8 - 23 mg/dL 11 - 13  Creatinine 0.44 - 1.00 mg/dL 0.84 - 0.88  Sodium 135 - 145 mmol/L 139 - 139  Potassium 3.5 - 5.1 mmol/L 4.2 4.6 4.8  Chloride 98 - 111 mmol/L 104 - 105  CO2 22 - 32 mmol/L 25 - 23  Calcium 8.9 - 10.3 mg/dL 9.2 - 9.5  Total Protein 6.5 - 8.1 g/dL 6.7 - -  Total Bilirubin 0.3 - 1.2 mg/dL 0.5 - -  Alkaline Phos 38 - 126 U/L 80 - -  AST 15 - 41 U/L 15 - -  ALT 0 - 44 U/L 42 - -      Component Value Date/Time   RBC 4.68 05/31/2021 1316   MCV 94.9 05/31/2021 1316   MCH 30.8 05/31/2021 1316   MCHC 32.4 05/31/2021 1316   RDW 13.4 05/31/2021 1316   LYMPHSABS 1.2 05/31/2021  1316   MONOABS 0.6 05/31/2021 1316   EOSABS 0.2 05/31/2021 1316   BASOSABS 0.0 05/31/2021 1316   Lab Results  Component Value Date   LDH 132 05/31/2021   LDH 145 02/23/2021   LDH 126 08/23/2020   Lab Results  Component Value Date   TIBC 390 05/31/2021   TIBC 369 02/23/2021   TIBC 452 (H) 12/22/2020   FERRITIN 58 05/31/2021   FERRITIN 97 02/23/2021   FERRITIN 13 12/22/2020   IRONPCTSAT 20 05/31/2021   IRONPCTSAT 18 02/23/2021   IRONPCTSAT 8 (L) 12/22/2020    DIAGNOSTIC IMAGING:  I have independently reviewed the scans and discussed with the patient. No results found.   ASSESSMENT:  1.  Iron deficiency anemia: She was initially noted to be iron deficient back in 2010 prompting work-up including endoscopy and colonoscopy which showed duodenal ulcers and poor absorption.  She has had several repeat EGDs and colonoscopies since last being on 2019 which was negative. Bone marrow biopsy and September 2020 showed normal marrow.  She is unable to tolerate p.o. iron supplements due to GI concerns.  She takes half a tablet intermittently with fair tolerance.  She also has tried Venofer which caused flulike symptoms and did not respond well to IV Ferrlecit.  Had an SPEP on 12/03/2019 which was negative.  Her last iron infusions (Injectafer) were on 04/21/2020, 05/17/2020 and 05/05/2020 with premeds.  She was evaluated by GI and had a colonoscopy on 05/25/2020 which showed several polyps in the transverse colon and descending colon which were both removed.  Endoscopy showed hiatal hernia and Cameron lesions with erosive gastropathy.  Also revealed large gallstones.  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   PLAN:  1.  Iron deficiency anemia: She currently denies any bleeding.  Last received Feraheme on 12/30/2020 and 03/07/2021.  Receives intermittent B12 injections last on 12/28/2020.  States she gets these by her PCP.  Labs from 05/31/2021 show ferritin 58, iron saturations 20% with a  hemoglobin of 14.4.  Patient does not tolerate oral or IV iron well and require several premeds.  Recommend continued observation and for her to continue multivitamin with iron.  Return to clinic in 3 months for repeat lab work and additional evaluation.  2.  Vitamin B12 deficiency: Her B12 level continues to drop although she is taking B12 supplements.  B12 level from 05/31/2021 was 311.  Recommend B12 injection today while in clinic and monthly with her PCP.  Dispostion-  B12 injections today.  Continue multivitamin with iron supplements.  Increase iron in her diet.  B12 injections monthly at her PCPs office.  RTC in 3 months for repeat lab work (CBC, CMP, iron panel, ferritin and B12 level) MD assessment and B12 injection.   I spent 15 minutes dedicated to the care of this patient (face-to-face and non-face-to-face) on the date of the encounter to include what is described in the assessment and plan.  Orders placed this encounter:  No orders of the defined types were placed in this encounter.  Faythe Casa, NP 06/08/2021 7:51 AM

## 2021-06-07 NOTE — Patient Instructions (Signed)
[place] at [facility] Discharge Instructions  You were seen today by Faythe Casa, NP. You received your Vitamin B12 injection today. Please follow up as scheduled.   Thank you for choosing [place] at [facility] to provide your oncology and hematology care.  To afford each patient quality time with our provider, please arrive at least 15 minutes before your scheduled appointment time.   If you have a lab appointment with the [facility] please come in thru the Main Entrance and check in at the main information desk.  You need to re-schedule your appointment should you arrive 10 or more minutes late.  We strive to give you quality time with our providers, and arriving late affects you and other patients whose appointments are after yours.  Also, if you no show three or more times for appointments you may be dismissed from the clinic at the providers discretion.     Again, thank you for choosing [facility].  Our hope is that these requests will decrease the amount of time that you wait before being seen by our physicians.       _____________________________________________________________  Should you have questions after your visit to [facility], please contact our office at [phone] and follow the prompts.  Our office hours are 8:00 a.m. and 4:30 p.m. Monday - Friday.  Please note that voicemails left after 4:00 p.m. may not be returned until the following business day.  We are closed weekends and major holidays.  You do have access to a nurse 24-7, just call the main number to the clinic [phone] and do not press any options, hold on the line and a nurse will answer the phone.    For prescription refill requests, have your pharmacy contact our office and allow 72 hours.    Due to Covid, you will need to wear a mask upon entering the hospital. If you do not have a mask, a mask will be given to you at the  Main Entrance upon arrival. For doctor visits, patients may have 1 support person age 70 or older with them. For treatment visits, patients can not have anyone with them due to social distancing guidelines and our immunocompromised population.

## 2021-06-07 NOTE — Progress Notes (Signed)
Annette Frank presents today for injection per the provider's orders.  Vitamin B12 administration without incident; injection site WNL; see MAR for injection details.  Patient tolerated procedure well and without incident and remained stable the entire visit. No questions or complaints noted at this time.

## 2021-06-08 ENCOUNTER — Encounter (HOSPITAL_COMMUNITY): Payer: Self-pay | Admitting: Hematology

## 2021-06-29 DEATH — deceased

## 2021-08-01 IMAGING — CT CT CHEST W/O CM
2 of 4 series · 15 of 36 positions shown, 18 images · non-contrast
Comparison: None.

CLINICAL DATA: Dysphagia and hiatal hernia. Evaluate hernia.
Patient reports chronic anemia and GERD. Endoscopy 1 week ago.

EXAM:
CT CHEST WITHOUT CONTRAST
TECHNIQUE: Multidetector CT imaging of the chest was performed following the
standard protocol without IV contrast.

[Series 2: routine chest without · axial · non-contrast · 0.72mm/px · z∈[+1056,+1338]mm · 12 of 167 slices shown, 15 images]
[im 13/167  mediastinal]
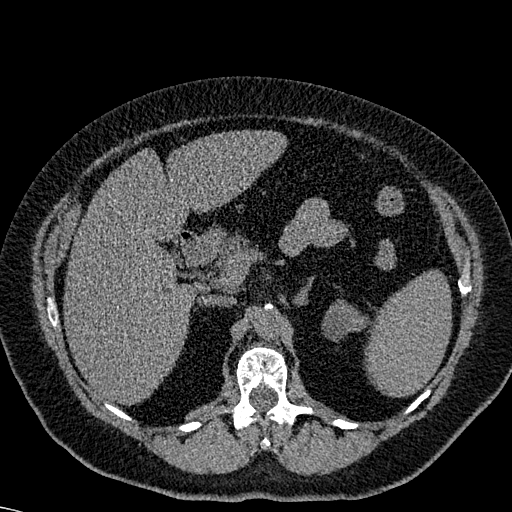
[im 13/167  lung]
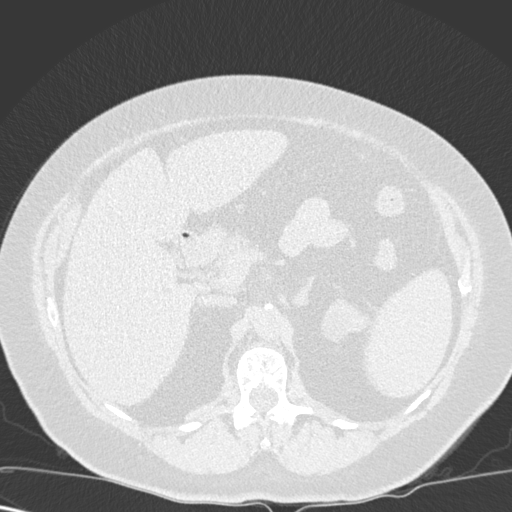
[im 26/167  lung]
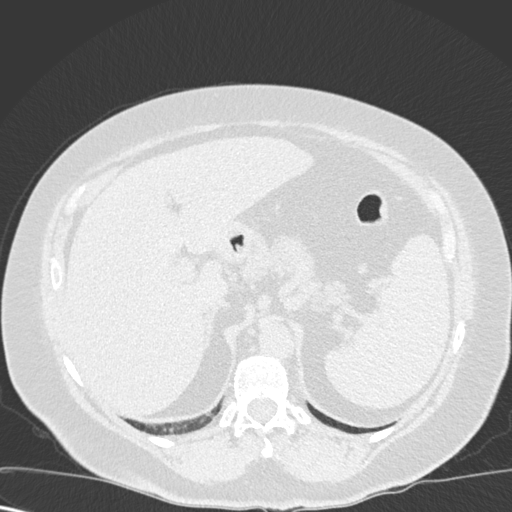
[im 39/167  lung]
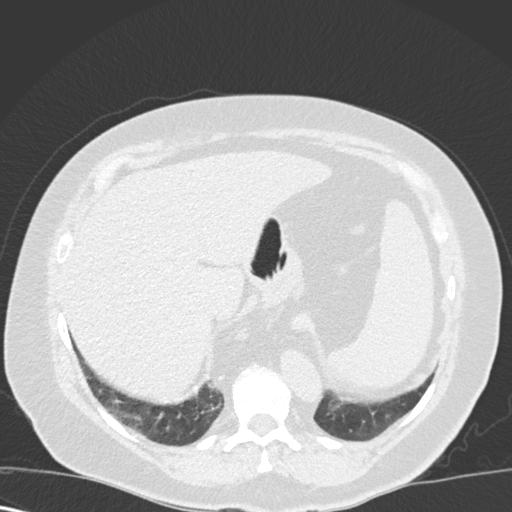
[im 52/167  lung]
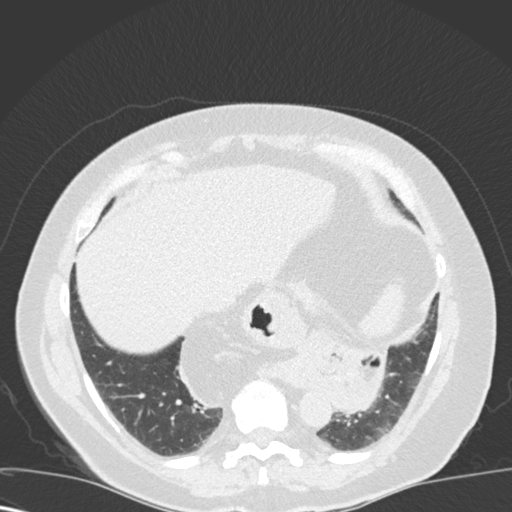
[im 64/167  mediastinal]
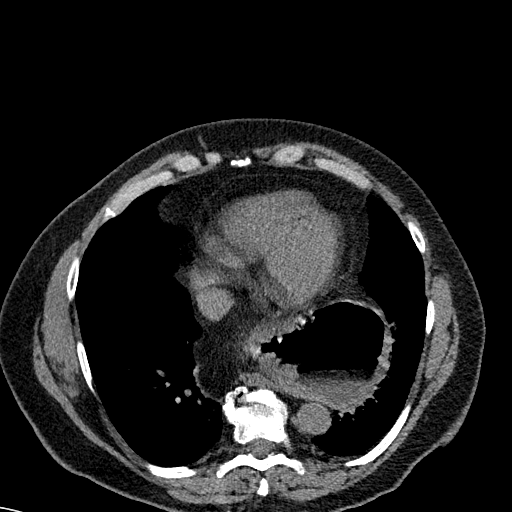
[im 64/167  lung]
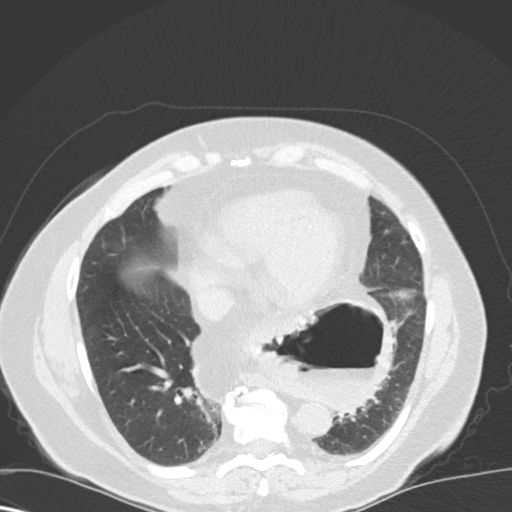
[im 77/167  lung]
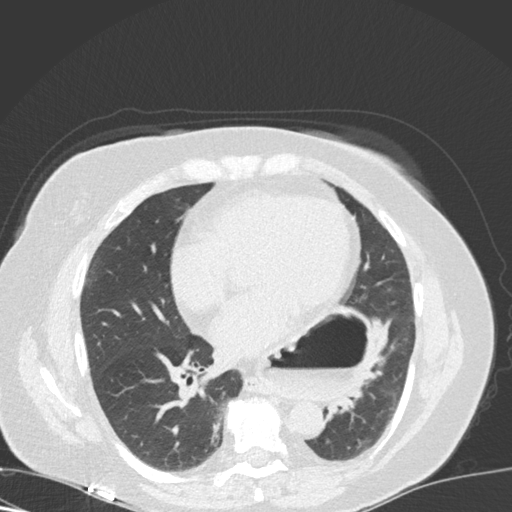
[im 90/167  lung]
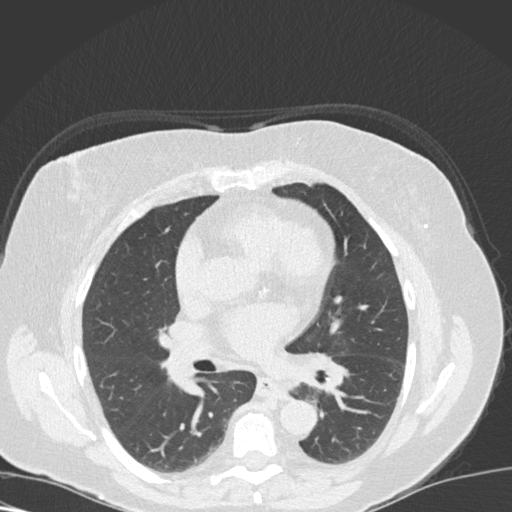
[im 103/167  lung]
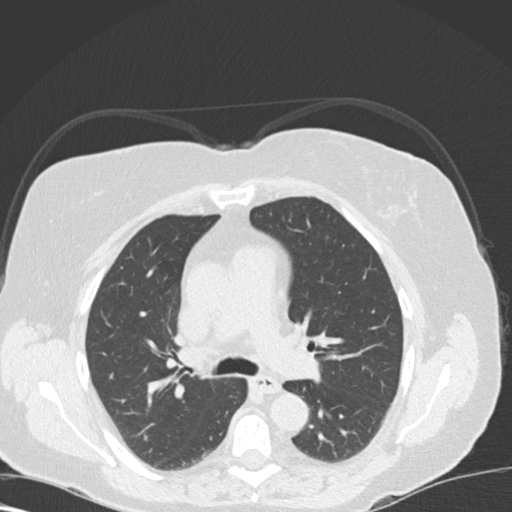
[im 115/167  mediastinal]
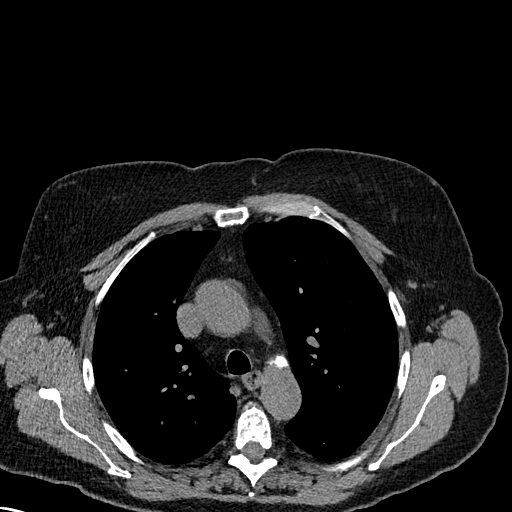
[im 115/167  lung]
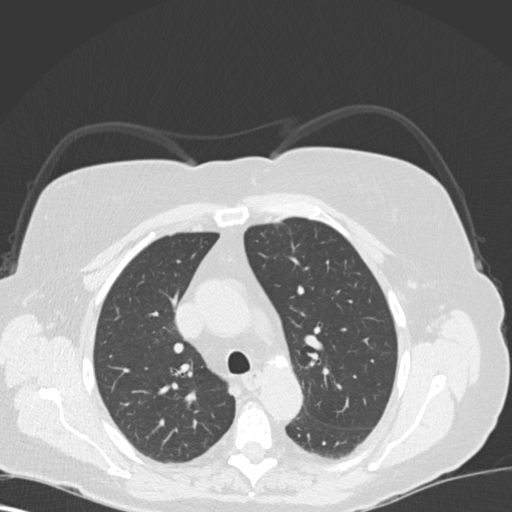
[im 128/167  lung]
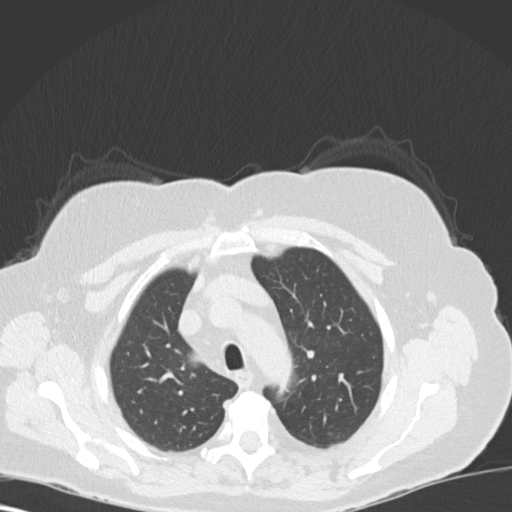
[im 141/167  lung]
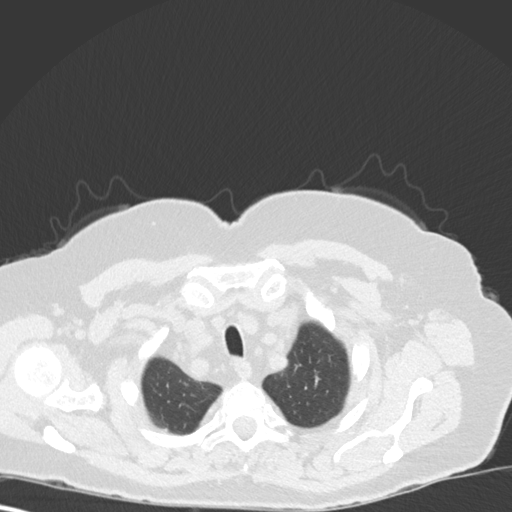
[im 154/167  lung]
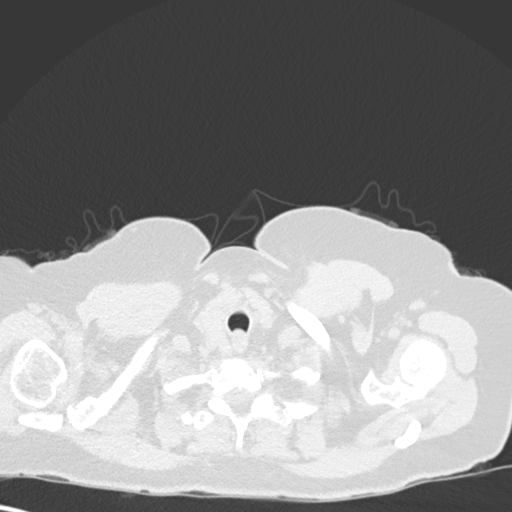

[Series 5: coronal · coronal · 0.66mm/px · 3 of 138 slices shown]
[im 28/138  lung]
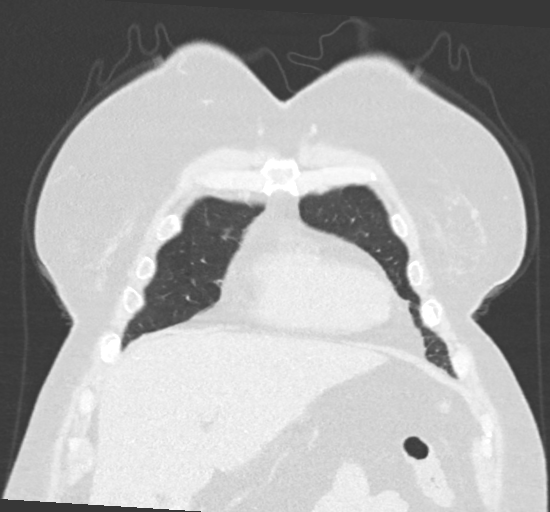
[im 55/138  lung]
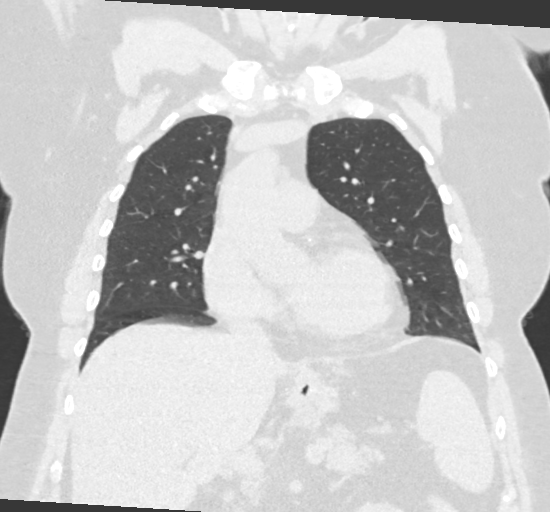
[im 83/138  lung]
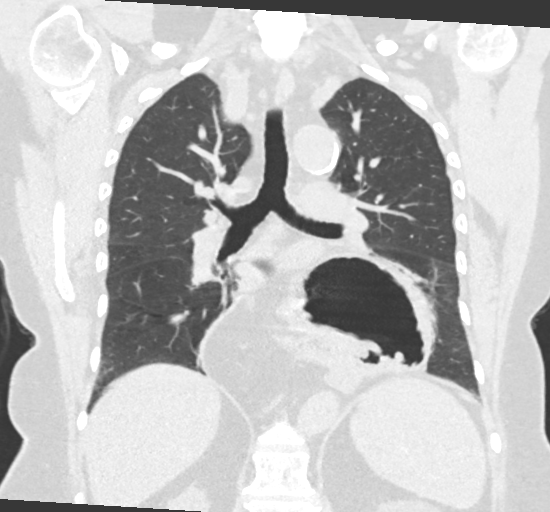

[15 of 36 positions shown; findings below may reference images not displayed]

FINDINGS: Cardiovascular: Normal caliber thoracic aorta with mild
atherosclerosis. There is mild aortic tortuosity. Heart is normal in
size. No pericardial effusion. Trace coronary artery calcifications.

Mediastinum/Nodes: Small mediastinal lymph nodes not enlarged by
size criteria. Limited assessment for hilar adenopathy in the
absence of IV contrast. No visualized thyroid nodule. Large
hiatal/paraesophageal hernia with greater than 50% of the stomach
intrathoracic. The gastroesophageal junction is just above the
diaphragmatic hiatus, and below the gastric fundus. Gastric fundus
extends greater than 2 cm cranial to the gastroesophageal junction.
There is no significant gastric wall thickening. No significant
esophageal thickening. Diaphragmatic hiatus also contains herniation
of intra-abdominal fat. There is no other upper abdominal organ
involvement.

Lungs/Pleura: Compressive atelectasis in the left lower lobe
adjacent to herniated stomach. Mild compressive atelectasis in the
medial right lower lobe due to herniation of intra-abdominal fat. No
confluent airspace disease. No findings of pulmonary edema. There is
no pleural fluid. No pulmonary mass or suspicious nodule. Trachea
and central bronchi are patent.

Upper Abdomen: Calcified granuloma in the liver. Calcified gallstone
partially included. Exophytic cyst from the left kidney is partially
included measures up to 5.7 cm. There is an additional cortical cyst
in the upper left kidney measuring 2.2 cm.

Musculoskeletal: Multilevel degenerative change in the spine with
endplate spurring. There are no acute or suspicious osseous
abnormalities.
IMPRESSION: 1. Large hiatal/paraesophageal hernia with greater than 50% of the
stomach intrathoracic, type 3.
2. Diaphragmatic hiatus also contains herniation of intra-abdominal
fat.
3. No acute intrathoracic abnormality.
4. Incidental findings in the upper abdomen of cholelithiasis and
left renal cysts.
5. Minimal coronary artery calcification. Aortic Atherosclerosis
([id number]).

## 2021-08-22 ENCOUNTER — Other Ambulatory Visit (HOSPITAL_COMMUNITY): Payer: Self-pay | Admitting: *Deleted

## 2021-08-22 DIAGNOSIS — D518 Other vitamin B12 deficiency anemias: Secondary | ICD-10-CM

## 2021-08-22 DIAGNOSIS — D509 Iron deficiency anemia, unspecified: Secondary | ICD-10-CM

## 2021-08-23 ENCOUNTER — Other Ambulatory Visit: Payer: Self-pay

## 2021-08-23 ENCOUNTER — Inpatient Hospital Stay (HOSPITAL_COMMUNITY): Payer: Medicare Other | Attending: Hematology

## 2021-08-23 DIAGNOSIS — D509 Iron deficiency anemia, unspecified: Secondary | ICD-10-CM | POA: Diagnosis not present

## 2021-08-23 DIAGNOSIS — D518 Other vitamin B12 deficiency anemias: Secondary | ICD-10-CM

## 2021-08-23 LAB — COMPREHENSIVE METABOLIC PANEL
ALT: 25 U/L (ref 0–44)
AST: 22 U/L (ref 15–41)
Albumin: 4.1 g/dL (ref 3.5–5.0)
Alkaline Phosphatase: 109 U/L (ref 38–126)
Anion gap: 8 (ref 5–15)
BUN: 11 mg/dL (ref 8–23)
CO2: 25 mmol/L (ref 22–32)
Calcium: 9.5 mg/dL (ref 8.9–10.3)
Chloride: 103 mmol/L (ref 98–111)
Creatinine, Ser: 0.91 mg/dL (ref 0.44–1.00)
GFR, Estimated: >60 mL/min (ref >60)
Glucose, Bld: 116 mg/dL — ABNORMAL HIGH (ref 70–99)
Potassium: 4.1 mmol/L (ref 3.5–5.1)
Sodium: 136 mmol/L (ref 135–145)
Total Bilirubin: 0.8 mg/dL (ref 0.3–1.2)
Total Protein: 7.1 g/dL (ref 6.5–8.1)

## 2021-08-23 LAB — CBC WITH DIFFERENTIAL/PLATELET
Abs Immature Granulocytes: 0.04 10*3/uL (ref 0.00–0.07)
Basophils Absolute: 0 10*3/uL (ref 0.0–0.1)
Basophils Relative: 0 %
Eosinophils Absolute: 0.2 10*3/uL (ref 0.0–0.5)
Eosinophils Relative: 2 %
HCT: 45.7 % (ref 36.0–46.0)
Hemoglobin: 14.6 g/dL (ref 12.0–15.0)
Immature Granulocytes: 1 %
Lymphocytes Relative: 13 %
Lymphs Abs: 1 10*3/uL (ref 0.7–4.0)
MCH: 30.5 pg (ref 26.0–34.0)
MCHC: 31.9 g/dL (ref 30.0–36.0)
MCV: 95.6 fL (ref 80.0–100.0)
Monocytes Absolute: 0.6 10*3/uL (ref 0.1–1.0)
Monocytes Relative: 8 %
Neutro Abs: 6.1 10*3/uL (ref 1.7–7.7)
Neutrophils Relative %: 76 %
Platelets: 203 10*3/uL (ref 150–400)
RBC: 4.78 MIL/uL (ref 3.87–5.11)
RDW: 13.3 % (ref 11.5–15.5)
WBC: 8 10*3/uL (ref 4.0–10.5)
nRBC: 0 % (ref 0.0–0.2)

## 2021-08-23 LAB — FERRITIN: Ferritin: 54 ng/mL (ref 11–307)

## 2021-08-23 LAB — IRON AND TIBC
Iron: 95 ug/dL (ref 28–170)
Saturation Ratios: 23 % (ref 10.4–31.8)
TIBC: 415 ug/dL (ref 250–450)
UIBC: 320 ug/dL

## 2021-08-23 LAB — VITAMIN B12: Vitamin B-12: 339 pg/mL (ref 180–914)

## 2021-08-29 NOTE — Progress Notes (Signed)
Virtual Visit via Telephone Note [facility]  I connected with Annette Frank  on 08/30/21  at  8:43 AM  by telephone and verified that I am speaking with the correct person using two identifiers.  Location: Patient: Home Provider: [facility]   I discussed the limitations, risks, security and privacy concerns of performing an evaluation and management service by telephone and the availability of in person appointments. I also discussed with the patient that there may be a patient responsible charge related to this service. The patient expressed understanding and agreed to proceed.   HISTORY OF PRESENT ILLNESS: Annette Frank follows at our clinic for iron deficiency anemia.  She was last evaluated by NP Faythe Casa via telemedicine visit on 06/07/2021.  She has been treated with intermittent IV Feraheme, most recently given on 01/06/2021.  She has not noticed any recent bleeding such as epistaxis, hematemesis, hematochezia, or melena. She reports that her fatigue is much better than it used to be, currently reports that her energy is about 70%. Her pica symptoms have resolved. Her dyspnea is also much better, she reports that she only gets short of breath if she overexerts herself, and that she is able to tolerate much more activity than she used to. She denies any chest pain, palpitations, lightheadedness, or syncope.  She does not take any iron supplements, but does take a Blood Builder multivitamin about 3 times per week, which has some iron in it.  She takes vitamin B12 and vitamin D daily as instructed.   Her chronic diarrhea, which has been present since her cholecystectomy has been improving.  Her energy today is 70% and her appetite is 75%.  She reports that she is maintaining a stable weight.    OBSERVATIONS/OBJECTIVE: Review of Systems  Constitutional:  Positive for malaise/fatigue (Fatigue improved, energy 70%). Negative for chills,  diaphoresis, fever and weight loss.  HENT:  Positive for congestion (Allergies).   Respiratory:  Positive for cough (Allergies). Negative for shortness of breath.   Cardiovascular:  Positive for chest pain (Left-sided musculoskeletal rib pain). Negative for palpitations.  Gastrointestinal:  Positive for diarrhea (Chronic but improved). Negative for abdominal pain, blood in stool, melena, nausea and vomiting.  Musculoskeletal:  Positive for joint pain.  Neurological:  Positive for headaches (Secondary to sinus congestion). Negative for dizziness.    PHYSICAL EXAM (per limitations of virtual telephone visit): The patient is alert and oriented x 3, exhibiting adequate mentation, good mood, and ability to speak in full sentences and execute sound judgement.   ASSESSMENT & PLAN: 1.  Iron deficiency anemia: - Ongoing anemia since 2010.  Patient has been extensively worked up for her anemia.  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  No evidence of hematologic neoplasm or morphology.  Normal cytogenetics.  No increase in blasts on flow cytometry. - Patient is unable to tolerate p.o. iron supplements due to GI upset.   - Patient is unable to tolerate Venofer due to flulike symptoms.  She does not respond to IV Ferrlecit.  She reports diarrhea after Feraheme,  which has also been worsened after she had cholecystectomy. - Most recent IV iron with Feraheme on 01/06/2021 with steroids/premedications.  Patient reports that she usually feels better after her iron infusions. - No  current signs or symptoms of blood loss such as epistaxis, hematemesis, hematochezia, or melena - Most recent labs (08/23/2021): Hgb 14.6 with normal CBC.  Ferritin is borderline at 54, with iron saturation 23%. - PLAN: Normal hemoglobin with adequate iron levels for the time being.  Recommend repeat labs and RTC in 3 months. - Instructed patient to start taking her Blood Builder multivitamin daily (instead of 3 times per week) - If she has marginal iron deficiency in the future, we may try ferrous bisglycinate oral supplementation to see if this is more gentle on her digestive tract.  2.  Vitamin B12 deficiency - She was taking vitamin B12 cyanocobalamin 1 mg tablet daily, but was noted to have decreasing vitamin B12 despite oral oral supplementation, likely in the setting of malabsorption - She was given vitamin B12 injection x1 on 06/07/2021 - She continues to take B12 cyanocobalamin 1 mg tablet daily- Most recent vitamin B12 (08/23/2021) is stable at 339 - PLAN: Continue B12 tablet daily.  Recheck vitamin B12 and methylmalonic acid in 3 months.   FOLLOW UP INSTRUCTIONS: -Labs in 3 months - RTC after labs    I discussed the assessment and treatment plan with the patient. The patient was provided an opportunity to ask questions and all were answered. The patient agreed with the plan and demonstrated an understanding of the instructions.   The patient was advised to call back or seek an in-person evaluation if the symptoms worsen or if the condition fails to improve as anticipated.  I provided 17 minutes of non-face-to-face time during this encounter.   Harriett Rush, PA-C 08/30/2021 9:18 AM

## 2021-08-30 ENCOUNTER — Other Ambulatory Visit: Payer: Self-pay

## 2021-08-30 ENCOUNTER — Encounter (HOSPITAL_COMMUNITY): Payer: Self-pay

## 2021-08-30 ENCOUNTER — Inpatient Hospital Stay (HOSPITAL_COMMUNITY): Payer: Medicare Other | Attending: Hematology | Admitting: Physician Assistant

## 2021-08-30 ENCOUNTER — Ambulatory Visit (HOSPITAL_COMMUNITY): Payer: Medicare Other | Admitting: Hematology

## 2021-08-30 DIAGNOSIS — D500 Iron deficiency anemia secondary to blood loss (chronic): Secondary | ICD-10-CM

## 2021-08-30 DIAGNOSIS — E538 Deficiency of other specified B group vitamins: Secondary | ICD-10-CM | POA: Diagnosis not present

## 2021-11-30 ENCOUNTER — Other Ambulatory Visit: Payer: Self-pay

## 2021-11-30 ENCOUNTER — Inpatient Hospital Stay (HOSPITAL_COMMUNITY): Payer: Medicare Other | Attending: Hematology

## 2021-11-30 DIAGNOSIS — D509 Iron deficiency anemia, unspecified: Secondary | ICD-10-CM | POA: Diagnosis not present

## 2021-11-30 DIAGNOSIS — E538 Deficiency of other specified B group vitamins: Secondary | ICD-10-CM | POA: Diagnosis not present

## 2021-11-30 DIAGNOSIS — D500 Iron deficiency anemia secondary to blood loss (chronic): Secondary | ICD-10-CM

## 2021-11-30 DIAGNOSIS — Z79899 Other long term (current) drug therapy: Secondary | ICD-10-CM | POA: Insufficient documentation

## 2021-11-30 LAB — CBC WITH DIFFERENTIAL/PLATELET
Abs Immature Granulocytes: 0.05 10*3/uL (ref 0.00–0.07)
Basophils Absolute: 0 10*3/uL (ref 0.0–0.1)
Basophils Relative: 0 %
Eosinophils Absolute: 0.2 10*3/uL (ref 0.0–0.5)
Eosinophils Relative: 2 %
HCT: 45.2 % (ref 36.0–46.0)
Hemoglobin: 14.5 g/dL (ref 12.0–15.0)
Immature Granulocytes: 1 %
Lymphocytes Relative: 15 %
Lymphs Abs: 1.1 10*3/uL (ref 0.7–4.0)
MCH: 29.9 pg (ref 26.0–34.0)
MCHC: 32.1 g/dL (ref 30.0–36.0)
MCV: 93.2 fL (ref 80.0–100.0)
Monocytes Absolute: 0.7 10*3/uL (ref 0.1–1.0)
Monocytes Relative: 8 %
Neutro Abs: 5.8 10*3/uL (ref 1.7–7.7)
Neutrophils Relative %: 74 %
Platelets: 198 10*3/uL (ref 150–400)
RBC: 4.85 MIL/uL (ref 3.87–5.11)
RDW: 13.3 % (ref 11.5–15.5)
WBC: 7.8 10*3/uL (ref 4.0–10.5)
nRBC: 0 % (ref 0.0–0.2)

## 2021-11-30 LAB — VITAMIN B12: Vitamin B-12: 304 pg/mL (ref 180–914)

## 2021-11-30 LAB — IRON AND TIBC
Iron: 73 ug/dL (ref 28–170)
Saturation Ratios: 17 % (ref 10.4–31.8)
TIBC: 438 ug/dL (ref 250–450)
UIBC: 365 ug/dL

## 2021-11-30 LAB — FERRITIN: Ferritin: 48 ng/mL (ref 11–307)

## 2021-12-01 NOTE — Progress Notes (Signed)
Annette Frank, [place] [address]   CLINIC:  Medical Oncology/Hematology  PCP:  Thea Alken [address] [place] [address] [phone]   REASON FOR VISIT:  Follow-up for iron deficiency anemia  PRIOR THERAPY: Prior PRBC transfusions and IV iron  CURRENT THERAPY: Daily multivitamin (blood builder)  INTERVAL HISTORY:  Annette Frank 71 y.o. female returns for routine follow-up of her iron deficiency anemia.  She was last evaluated via telemedicine visit by Tarri Abernethy PA-C on 08/30/2021.  At today's visit, she reports feeling fairly well.  No recent hospitalizations, surgeries, or changes in baseline health status.  She denies any recent signs or symptoms of blood loss such as epistaxis, hematemesis, hematochezia, or melena.  She has some mild fatigue with energy about 60%, but reports that this is "much better than it was when she was anemic."  She is symptomatic with some restless leg symptoms, but denies any pica.  She has sinus headaches intermittently.  Reports left-sided rib pain, but denies any anginal type chest pain, dyspnea on exertion, lightheadedness, or syncope.  She is continuing to take her daily blood builder multivitamin and vitamin B12 supplementation.  Her diet is high in carbohydrates, she is not able to tolerate meat; she eats occasional chicken, but rarely eats red meat.  She has 60% energy and 80% appetite. She endorses that she is maintaining a stable weight.   REVIEW OF SYSTEMS:  Review of Systems  Constitutional:  Positive for fatigue (Energy 60%). Negative for appetite change, chills, diaphoresis, fever and unexpected weight change.  HENT:   Negative for lump/mass and nosebleeds.   Eyes:  Negative for eye problems.  Respiratory:  Negative for cough, hemoptysis and shortness of breath.   Cardiovascular:  Positive for chest pain (Left rib pain/chest wall pain). Negative for leg swelling and palpitations.   Gastrointestinal:  Positive for diarrhea and nausea. Negative for abdominal pain, blood in stool, constipation and vomiting.  Genitourinary:  Negative for hematuria.   Musculoskeletal:  Positive for arthralgias (arthritis, received recent cortisone shot).  Skin: Negative.   Neurological:  Positive for headaches. Negative for dizziness and light-headedness.  Hematological:  Does not bruise/bleed easily.  Psychiatric/Behavioral:  Positive for sleep disturbance.      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   Dysphagia    Heartburn    Hiatal hernia    Hypothyroidism    Obesity    Thyroid disease    Past Surgical History:  Procedure Laterality Date   ABDOMINAL HYSTERECTOMY     BIOPSY  05/25/2020   Procedure: BIOPSY;  Surgeon: Harvel Quale, MD;  [facility];  Service: Gastroenterology;;  duodenum   COLONOSCOPY WITH PROPOFOL N/A 05/25/2020   Procedure: COLONOSCOPY WITH PROPOFOL;  Surgeon: Harvel Quale, MD;  [facility];  Service: Gastroenterology;  Laterality: N/A;  1045   ESOPHAGOGASTRODUODENOSCOPY (EGD) WITH PROPOFOL N/A 05/25/2020   Procedure: ESOPHAGOGASTRODUODENOSCOPY (EGD) WITH PROPOFOL;  Surgeon: Harvel Quale, MD;  [facility];  Service: Gastroenterology;  Laterality: N/A;   LUMBAR LAMINECTOMY N/A 2001   MEDIAL PARTIAL KNEE REPLACEMENT Bilateral 2009 & 2015   POLYPECTOMY  05/25/2020   Procedure: POLYPECTOMY;  Surgeon: Harvel Quale, MD;  [facility];  Service: Gastroenterology;;  colon   TOTAL HIP ARTHROPLASTY  Right 8182   UMBILICAL HERNIA REPAIR N/A 1955   XI ROBOTIC ASSISTED HIATAL HERNIA REPAIR N/A 11/04/2020   Procedure: ROBOTIC REPAIR OF PARAESOPHAGEAL HIATAL HERNIA WITH TOUPET FUNDOPLICATION WITH MESH, ROBOTIC CHOLECYSTECTOMY, PARTIAL OMENTECTOMY, BILATERAL TAP BLOCK;   Surgeon: Michael Boston, MD;  [facility];  Service: General;  Laterality: N/A;     SOCIAL HISTORY:  Social History   Socioeconomic History   Marital status: Married    Spouse name: Not on file   Number of children: 2   Years of education: Not on file   Highest education level: Not on file  Occupational History   Occupation: RETIRED  Tobacco Use   Smoking status: Never   Smokeless tobacco: Never  Vaping Use   Vaping Use: Never used  Substance and Sexual Activity   Alcohol use: Never   Drug use: Not Currently   Sexual activity: Not Currently  Other Topics Concern   Not on file  Social History Narrative   Not on file   Social Determinants of Health   Financial Resource Strain: Not on file  Food Insecurity: Not on file  Transportation Needs: Not on file  Physical Activity: Not on file  Stress: Not on file  Social Connections: Not on file  Intimate Partner Violence: Not on file    FAMILY HISTORY:  Family History  Problem Relation Age of Onset   Hypertension Mother    Cancer Mother        Endometrial cancer   Hypothyroidism Father    Arthritis Father    Hypertension Brother    Heart attack Maternal Grandfather    Hypertension Son    Healthy Son     CURRENT MEDICATIONS:  Outpatient Encounter Medications as of 12/04/2021  Medication Sig Note   acetaminophen (TYLENOL) 500 MG tablet Take 1,000 mg by mouth every 6 (six) hours as needed for moderate pain.    albuterol (VENTOLIN HFA) 108 (90 Base) MCG/ACT inhaler Inhale 1-2 puffs into the lungs every 6 (six) hours as needed for wheezing or shortness of breath.    atenolol (TENORMIN) 25 MG tablet Take 25 mg by mouth daily.     azelastine (ASTELIN) 0.1 % nasal spray Place 1 spray into both nostrils at bedtime.    cephALEXin (KEFLEX) 500 MG capsule Take 2,000 mg by mouth See admin instructions. Before dental procedures    fluticasone (FLONASE) 50 MCG/ACT nasal spray Place 1 spray into both nostrils daily as needed for  allergies.    ibuprofen (ADVIL) 200 MG tablet Take 400 mg by mouth every 8 (eight) hours as needed for moderate pain.    levothyroxine (SYNTHROID) 100 MCG tablet Take 100 mcg by mouth daily before breakfast.    PREVALITE 4 GM/DOSE powder Take by mouth.    vitamin B-12 (CYANOCOBALAMIN) 100 MCG tablet Take 100 mcg by mouth daily.    Vitamin D, Ergocalciferol, (DRISDOL) 1.25 MG (50000 UT) CAPS capsule Take 50,000 Units by mouth once a week. 10/17/2020: Mondays    No facility-administered encounter medications on file as of 12/04/2021.    ALLERGIES:  Allergies  Allergen Reactions   Cefuroxime Axetil Nausea And Vomiting   Skelaxin [Metaxalone] Nausea And Vomiting   Codeine Palpitations   Tincture Of Benzoin [Benzoin] Rash     PHYSICAL EXAM:  ECOG PERFORMANCE STATUS: 1 - Symptomatic but completely ambulatory  There were no vitals filed for this visit. There were no vitals filed for this visit. Physical Exam Constitutional:      Appearance: Normal  appearance. She is obese.  HENT:     Head: Normocephalic and atraumatic.     Mouth/Throat:     Mouth: Mucous membranes are moist.  Eyes:     Extraocular Movements: Extraocular movements intact.     Pupils: Pupils are equal, round, and reactive to light.  Cardiovascular:     Rate and Rhythm: Normal rate and regular rhythm.     Pulses: Normal pulses.     Heart sounds: Normal heart sounds.  Pulmonary:     Effort: Pulmonary effort is normal.     Breath sounds: Normal breath sounds.  Abdominal:     General: Bowel sounds are normal.     Palpations: Abdomen is soft.     Tenderness: There is no abdominal tenderness.  Musculoskeletal:        General: No swelling.     Right lower leg: No edema.     Left lower leg: No edema.  Lymphadenopathy:     Cervical: No cervical adenopathy.  Skin:    General: Skin is warm and dry.  Neurological:     General: No focal deficit present.     Mental Status: She is alert and oriented to person, place,  and time.  Psychiatric:        Mood and Affect: Mood normal.        Behavior: Behavior normal.     LABORATORY DATA:  I have reviewed the labs as listed.  CBC    Component Value Date/Time   WBC 7.8 11/30/2021 1012   RBC 4.85 11/30/2021 1012   HGB 14.5 11/30/2021 1012   HCT 45.2 11/30/2021 1012   PLT 198 11/30/2021 1012   MCV 93.2 11/30/2021 1012   MCH 29.9 11/30/2021 1012   MCHC 32.1 11/30/2021 1012   RDW 13.3 11/30/2021 1012   LYMPHSABS 1.1 11/30/2021 1012   MONOABS 0.7 11/30/2021 1012   EOSABS 0.2 11/30/2021 1012   BASOSABS 0.0 11/30/2021 1012   CMP Latest Ref Rng & Units 08/23/2021 11/11/2020 11/06/2020  Glucose 70 - 99 mg/dL 116(H) 121(H) -  BUN 8 - 23 mg/dL 11 11 -  Creatinine 0.44 - 1.00 mg/dL 0.91 0.84 -  Sodium 135 - 145 mmol/L 136 139 -  Potassium 3.5 - 5.1 mmol/L 4.1 4.2 4.6  Chloride 98 - 111 mmol/L 103 104 -  CO2 22 - 32 mmol/L 25 25 -  Calcium 8.9 - 10.3 mg/dL 9.5 9.2 -  Total Protein 6.5 - 8.1 g/dL 7.1 6.7 -  Total Bilirubin 0.3 - 1.2 mg/dL 0.8 0.5 -  Alkaline Phos 38 - 126 U/L 109 80 -  AST 15 - 41 U/L 22 15 -  ALT 0 - 44 U/L 25 42 -    DIAGNOSTIC IMAGING:  I have independently reviewed the relevant imaging and discussed with the patient.  ASSESSMENT & PLAN: 1.  Iron deficiency anemia: - Ongoing anemia since 2010.  Patient has been extensively worked up for her anemia.  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  No evidence of hematologic neoplasm or morphology.  Normal cytogenetics.  No increase in blasts  on flow cytometry. -  Patient is unable to tolerate p.o. iron supplements due to GI upset.   - Patient is unable to tolerate Venofer due to flulike symptoms.  She does not respond to IV Ferrlecit.  She reports diarrhea after Feraheme, which has also been worsened after she had cholecystectomy. - Most recent IV iron with Feraheme on 01/06/2021 with steroids/premedications.  Patient reports that she usually feels better after her iron infusions. - No current signs or symptoms of blood loss such as epistaxis, hematemesis, hematochezia, or melena - Symptomatic with restless legs and mild fatigue - Most recent labs (11/30/2021): Hgb 14.5 with normal CBC.  Ferritin is borderline at 48, with iron saturation 17 %. - PLAN: Normal hemoglobin with mild iron deficiency - Recommend trial of ferrous bisglycinate to see if this is better tolerated. - Recommend repeat labs and RTC with phone visit in 3 months.   2.  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  Recheck vitamin B12 and methylmalonic acid in 3 months.   PLAN SUMMARY & DISPOSITION: Labs and phone visit in 3 months  All questions were answered. The patient knows to call the clinic with any problems, questions or concerns.  Medical decision making: Low  Time spent on visit: I spent 20 minutes counseling the patient face to face. The total time spent in the appointment was 30 minutes and more than 50% was on counseling.   Harriett Rush, PA-C  12/04/2021 2:11 PM

## 2021-12-04 ENCOUNTER — Other Ambulatory Visit: Payer: Self-pay

## 2021-12-04 ENCOUNTER — Inpatient Hospital Stay (HOSPITAL_BASED_OUTPATIENT_CLINIC_OR_DEPARTMENT_OTHER): Payer: Medicare Other | Admitting: Physician Assistant

## 2021-12-04 VITALS — BP 141/73 | HR 65 | Temp 98.2°F | Resp 18 | Ht 66.0 in | Wt 238.0 lb

## 2021-12-04 DIAGNOSIS — E538 Deficiency of other specified B group vitamins: Secondary | ICD-10-CM

## 2021-12-04 DIAGNOSIS — D500 Iron deficiency anemia secondary to blood loss (chronic): Secondary | ICD-10-CM

## 2021-12-04 DIAGNOSIS — D509 Iron deficiency anemia, unspecified: Secondary | ICD-10-CM | POA: Diagnosis not present

## 2021-12-04 LAB — METHYLMALONIC ACID, SERUM: Methylmalonic Acid, Quantitative: 192 nmol/L (ref 0–378)

## 2021-12-04 NOTE — Patient Instructions (Signed)
[place] Discharge Instructions  You were seen today by Tarri Abernethy PA-C for your iron deficiency anemia.  Your blood levels looked great, but your iron is slightly lower than your target goal.  I recommend that you start taking gentle iron (FERROUS BISGLYCINATE) once daily with a glass of orange juice in the morning.    LABS: Return in 3 months for repeat labs   FOLLOW-UP APPOINTMENT: Phone visit in 3 months after labs   Thank you for choosing [facility] to provide your oncology and hematology care.  To afford each patient quality time with our provider, please arrive at least 15 minutes before your scheduled appointment time.   If you have a lab appointment with the [facility] please come in thru the Main Entrance and check in at the main information desk.  You need to re-schedule your appointment should you arrive 10 or more minutes late.  We strive to give you quality time with our providers, and arriving late affects you and other patients whose appointments are after yours.  Also, if you no show three or more times for appointments you may be dismissed from the clinic at the providers discretion.     Again, thank you for choosing [facility]  Our hope is that these requests will decrease the amount of time that you wait before being seen by our physicians.       _____________________________________________________________  Should you have questions after your visit to [facility], please contact our office at [phone] and follow the prompts.  Our office hours are 8:00 a.m. and 4:30 p.m. Monday - Friday.  Please note that voicemails left after 4:00 p.m. may not be returned until the following business day.  We are closed weekends and major holidays.  You do have access to a nurse 24-7, just call the main number to the clinic [phone] and do not press any options, hold  on the line and a nurse will answer the phone.    For prescription refill requests, have your pharmacy contact our office and allow 72 hours.    Due to Covid, you will need to wear a mask upon entering the hospital. If you do not have a mask, a mask will be given to you at the Main Entrance upon arrival. For doctor visits, patients may have 1 support person age 56 or older with them. For treatment visits, patients can not have anyone with them due to social distancing guidelines and our immunocompromised population.

## 2021-12-30 IMAGING — RF DG ESOPHAGUS
7 of 8 series · 15 of 20 positions shown · non-contrast
Comparison: None.

CLINICAL DATA: Fundoplication performed yesterday.

EXAM:
ESOPHOGRAM/BARIUM SWALLOW
TECHNIQUE: Single contrast examination was performed using  Gastrografin.
FLUOROSCOPY TIME:  Fluoroscopy Time:  0 minutes 42 seconds
Radiation Exposure Index (if provided by the fluoroscopic device): 1
6 8 5 micro gray meter squared

[Series 1: fluoro_barium 2fps_bw · 0.18mm/px · 3 of 12 frames shown (1 of 6)]
[frame 2/12]
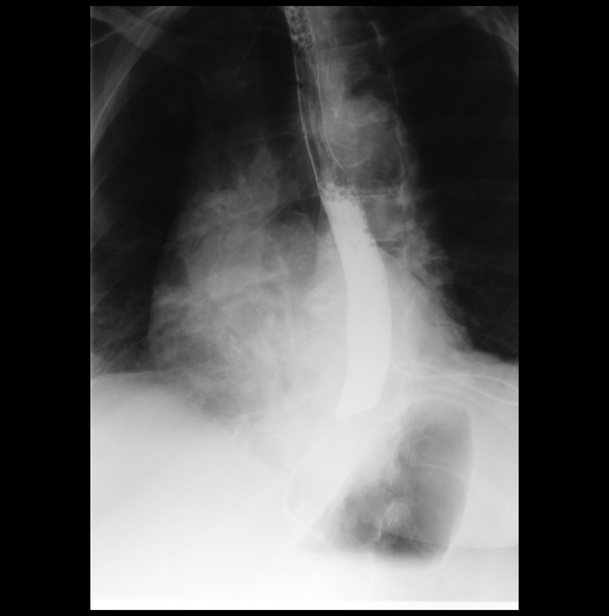
[frame 7/12]
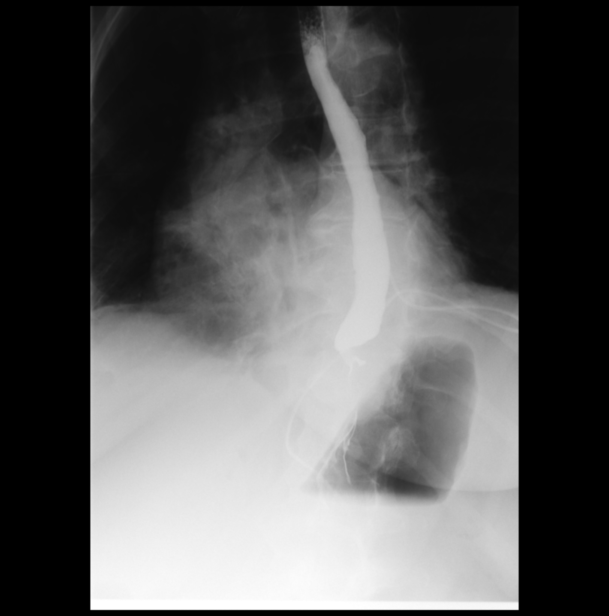
[frame 11/12]
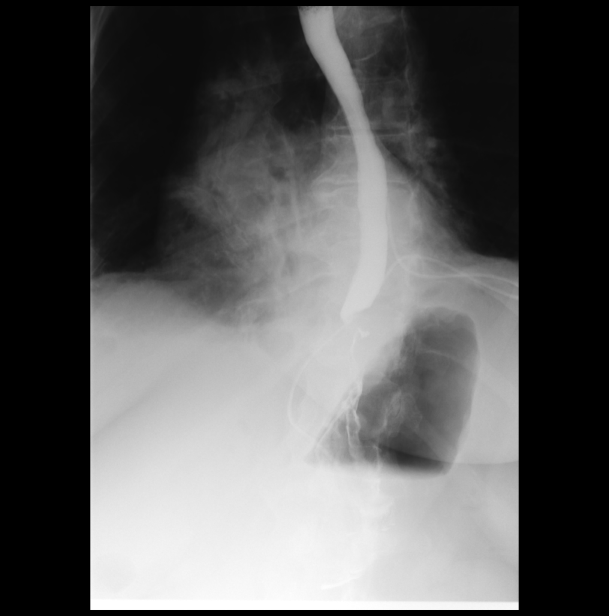

[Series 2: fluoro_barium 2fps_bw · 0.18mm/px · 3 of 8 frames shown (2 of 6)]
[frame 2/8]
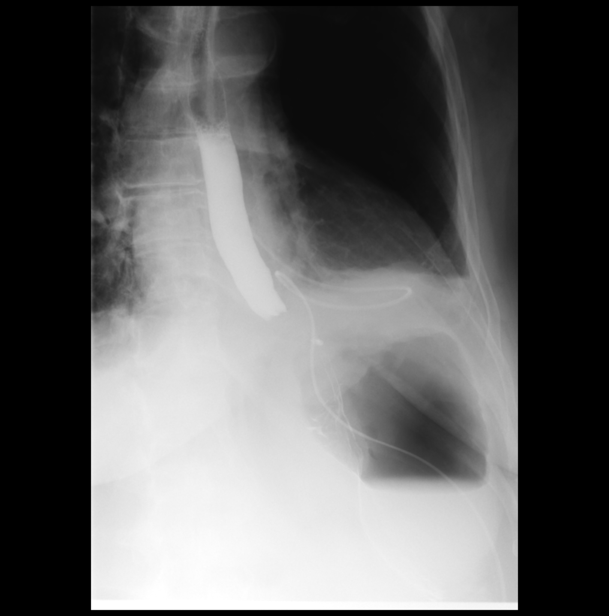
[frame 5/8]
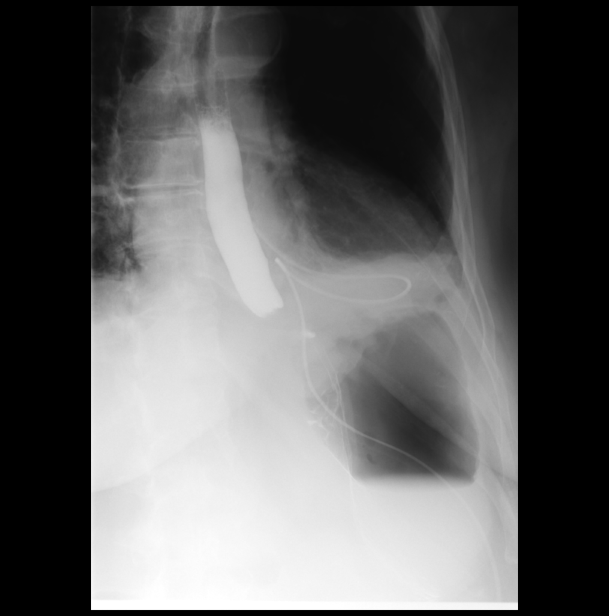
[frame 7/8]
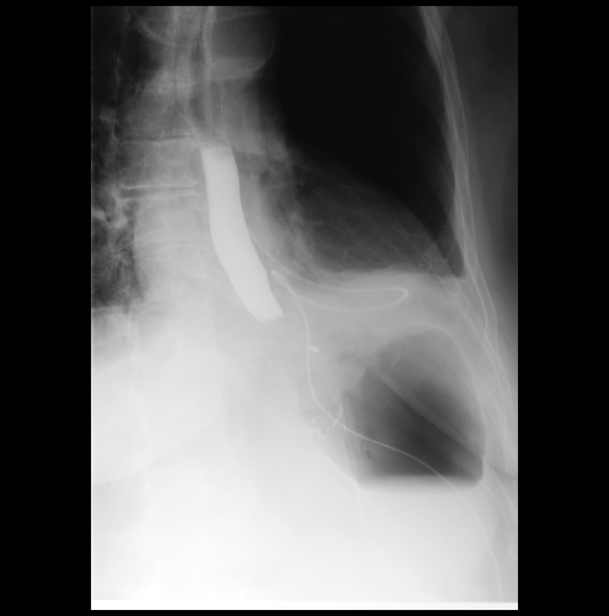

[Series 3: fluoro_barium 2fps_bw · 0.18mm/px · 2 of 4 frames shown (3 of 6)]
[frame 1/4]
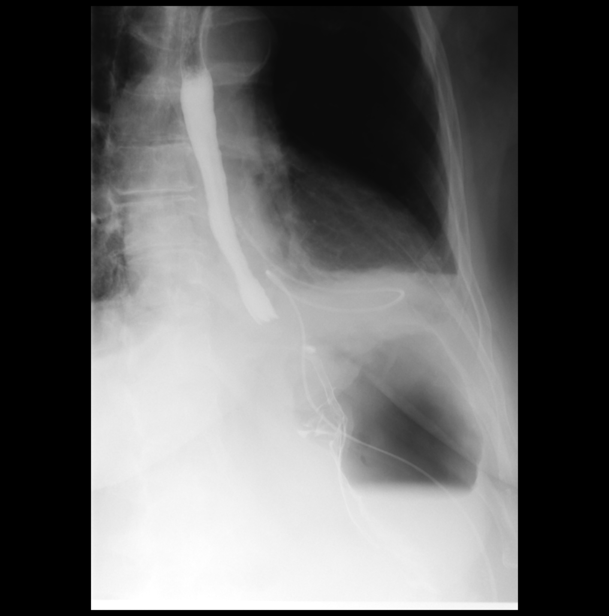
[frame 4/4]
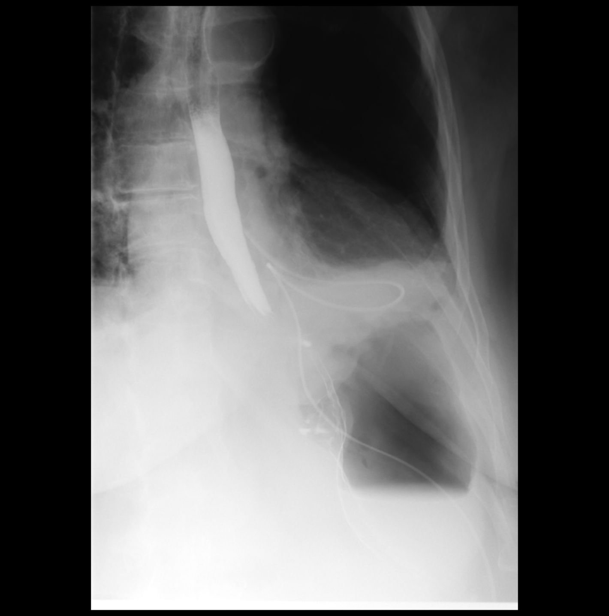

[Series 4: fluoro_barium 2fps_bw · 0.18mm/px · 2 of 6 frames shown (4 of 6)]
[frame 1/6]
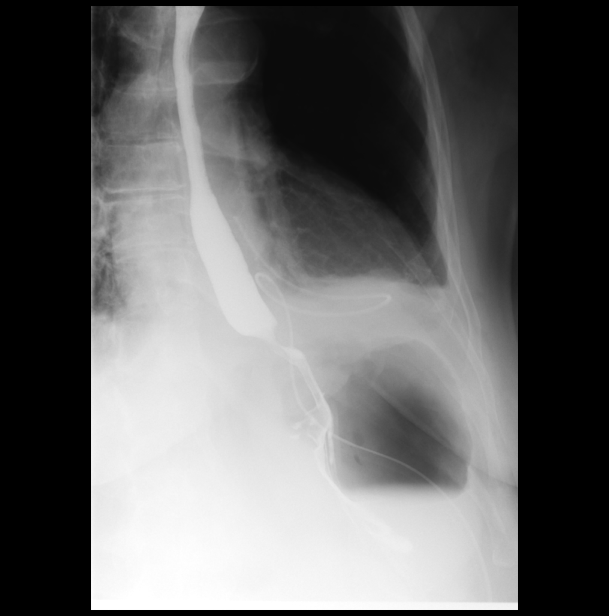
[frame 4/6]
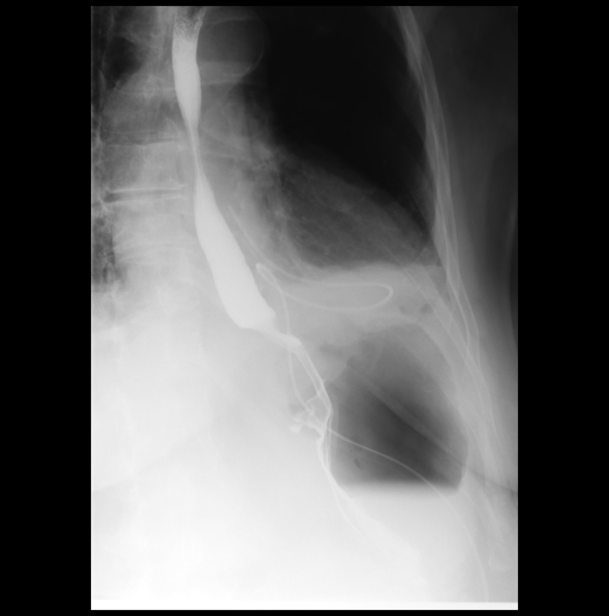

[Series 5: fluoro_barium 2fps_bw · 0.19mm/px · 3 of 3 frames shown (5 of 6)]
[frame 1/3]
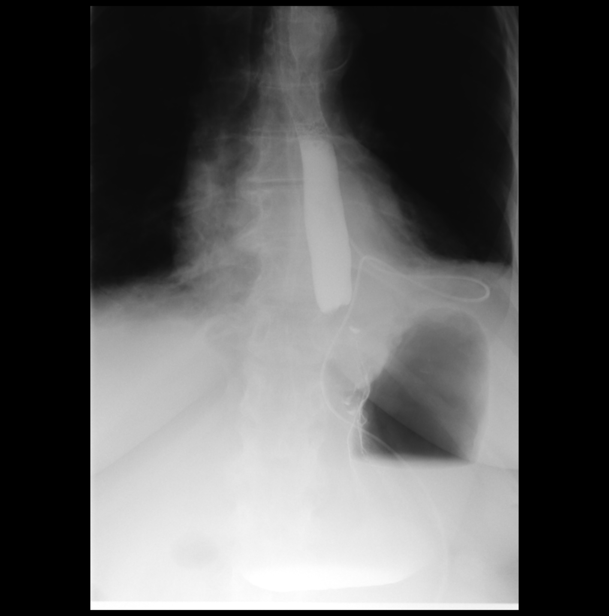
[frame 2/3]
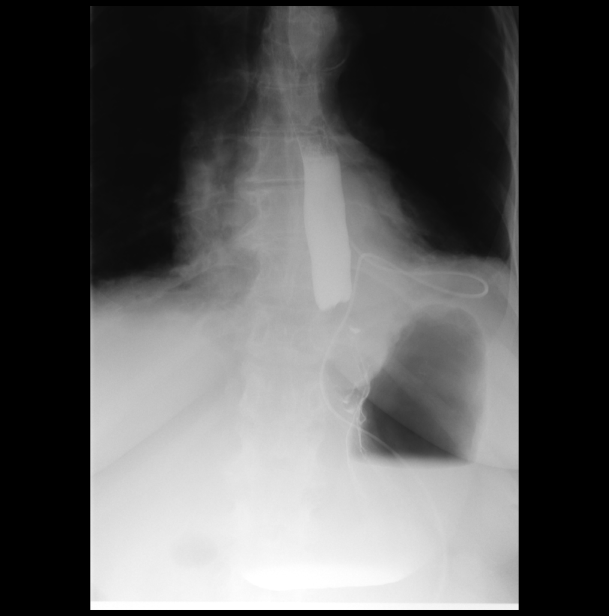
[frame 3/3]
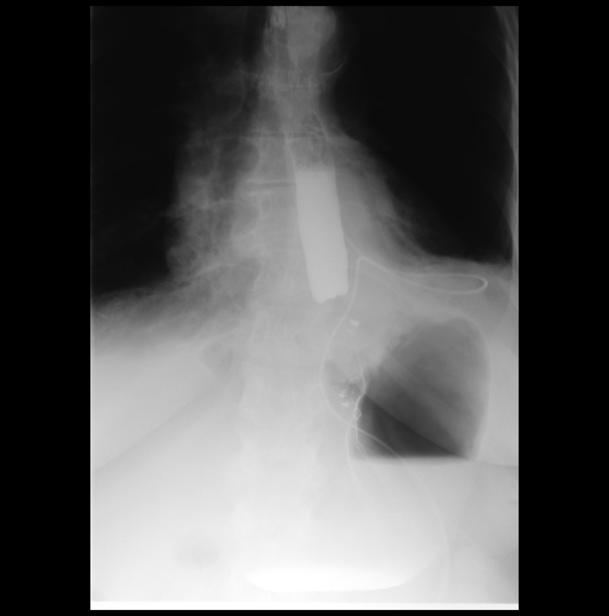

[Series 7: cp_standard · 0.29mm/px · 1 of 1 slices shown]
[im 1/1]
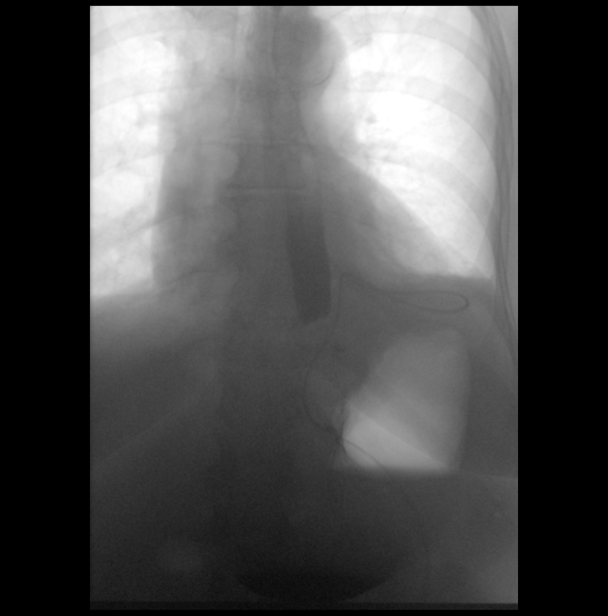

[Series 8: fluoro_barium 2fps_bw · 0.19mm/px · 1 of 1 slices shown (6 of 6)]
[im 1/1]
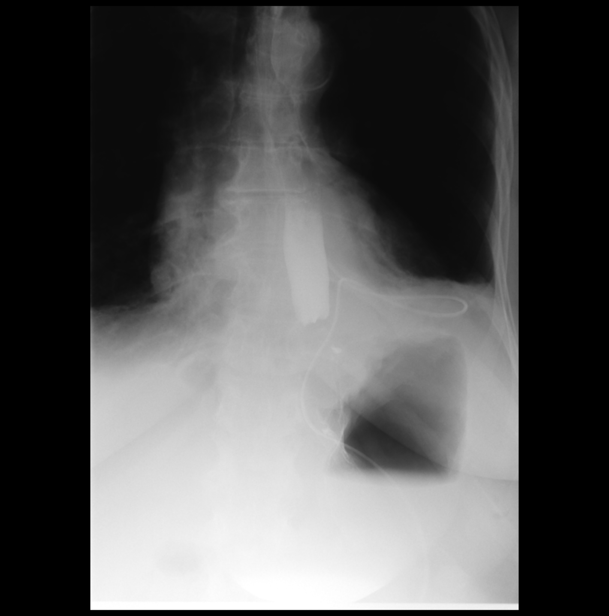

[15 of 20 positions shown; findings below may reference images not displayed]

FINDINGS: The patient swallowed Gastrografin without difficulty. There is mild
relative delay passage of contrast through the region of
fundoplication. Tiny outpouching in the region of the fundoplication
but no leak.
IMPRESSION: No obstruction. No leak. Slight delay to passage of contrast through
the region of fundoplication as often seen in the early
postoperative time frame. Tiny outpouching of contrast in the region
of fundoplication but no actual leak.

## 2022-03-05 ENCOUNTER — Inpatient Hospital Stay (HOSPITAL_COMMUNITY): Payer: Medicare Other | Attending: Hematology

## 2022-03-05 DIAGNOSIS — Z79899 Other long term (current) drug therapy: Secondary | ICD-10-CM | POA: Diagnosis not present

## 2022-03-05 DIAGNOSIS — E538 Deficiency of other specified B group vitamins: Secondary | ICD-10-CM | POA: Insufficient documentation

## 2022-03-05 DIAGNOSIS — D500 Iron deficiency anemia secondary to blood loss (chronic): Secondary | ICD-10-CM

## 2022-03-05 DIAGNOSIS — D508 Other iron deficiency anemias: Secondary | ICD-10-CM | POA: Diagnosis present

## 2022-03-05 LAB — CBC WITH DIFFERENTIAL/PLATELET
Abs Immature Granulocytes: 0.03 10*3/uL (ref 0.00–0.07)
Basophils Absolute: 0 10*3/uL (ref 0.0–0.1)
Basophils Relative: 0 %
Eosinophils Absolute: 0.2 10*3/uL (ref 0.0–0.5)
Eosinophils Relative: 2 %
HCT: 45.4 % (ref 36.0–46.0)
Hemoglobin: 14.2 g/dL (ref 12.0–15.0)
Immature Granulocytes: 0 %
Lymphocytes Relative: 14 %
Lymphs Abs: 1.2 10*3/uL (ref 0.7–4.0)
MCH: 28.6 pg (ref 26.0–34.0)
MCHC: 31.3 g/dL (ref 30.0–36.0)
MCV: 91.5 fL (ref 80.0–100.0)
Monocytes Absolute: 0.8 10*3/uL (ref 0.1–1.0)
Monocytes Relative: 10 %
Neutro Abs: 5.8 10*3/uL (ref 1.7–7.7)
Neutrophils Relative %: 74 %
Platelets: 178 10*3/uL (ref 150–400)
RBC: 4.96 MIL/uL (ref 3.87–5.11)
RDW: 13.9 % (ref 11.5–15.5)
WBC: 8 10*3/uL (ref 4.0–10.5)
nRBC: 0 % (ref 0.0–0.2)

## 2022-03-05 LAB — IRON AND TIBC
Iron: 67 ug/dL (ref 28–170)
Saturation Ratios: 16 % (ref 10.4–31.8)
TIBC: 416 ug/dL (ref 250–450)
UIBC: 349 ug/dL

## 2022-03-05 LAB — FERRITIN: Ferritin: 48 ng/mL (ref 11–307)

## 2022-03-11 NOTE — Progress Notes (Signed)
? ?[facility] ?[address] ?[place], [place] [address] ? ? ?CLINIC:  ?Medical Oncology/Hematology ? ?PCP:  ?Ephriam Jenkins E ?[address] ?[place] [address] ?[phone] ? ? ?REASON FOR VISIT:  ?Follow-up for iron deficiency anemia ? ?PRIOR THERAPY: PRBC transfusions and intermittent IV iron ? ?CURRENT THERAPY: Ferrous bisglycinate + oral B12 supplement ? ?INTERVAL HISTORY:  ?[name] 71 y.o. female returns for routine follow-up of her iron deficiency and B12 deficiency.  She was last seen by Tarri Abernethy PA-C on 12/04/2021. ? ?Patient reports that she tried to take ferrous bisglycinate to see if she could tolerate this better than ferrous sulfate, but was still having GI upset and worsening diarrhea.  She has been able to take and tolerate her vitamin B12 tablet daily. ?She denies any signs of blood loss such as bright red blood per rectum, melena, or epistaxis. ?She is symptomatic with fatigue and mild dyspnea on exertion.  She reports headaches and joint pain.  She denies any pica, restless legs, chest pain, lightheadedness, or syncope. ? ?She has 70% energy and 80% appetite. She endorses that she is maintaining a stable weight. ? ? ?REVIEW OF SYSTEMS:  ?Review of Systems  ?Constitutional:  Positive for fatigue. Negative for appetite change, chills, diaphoresis, fever and unexpected weight change.  ?HENT:   Negative for lump/mass and nosebleeds.   ?Eyes:  Negative for eye problems.  ?Respiratory:  Positive for shortness of breath (with exertion). Negative for cough and hemoptysis.   ?Cardiovascular:  Negative for chest pain, leg swelling and palpitations.  ?Gastrointestinal:  Positive for diarrhea and nausea. Negative for abdominal pain, blood in stool, constipation and vomiting.  ?Genitourinary:  Negative for hematuria.   ?Skin: Negative.   ?Neurological:  Positive for headaches. Negative for dizziness and light-headedness.  ?Hematological:  Does not bruise/bleed easily.   ?Psychiatric/Behavioral:  Positive for depression and sleep disturbance.    ? ? ?PAST MEDICAL/SURGICAL HISTORY:  ?Past Medical History:  ?Diagnosis Date  ? Anemia   ? Arthritis   ? Asthma   ? Cancer [facility])   ? 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 Dysphagia   ? Heartburn   ? Hiatal hernia   ? Hypothyroidism   ? Obesity   ? Thyroid disease   ? ?Past Surgical History:  ?Procedure Laterality Date  ? ABDOMINAL HYSTERECTOMY    ? BIOPSY  05/25/2020  ? Procedure: BIOPSY;  Surgeon: Harvel Quale, MD;  [facility];  Service: Gastroenterology;;  duodenum  ? COLONOSCOPY WITH PROPOFOL N/A 05/25/2020  ? Procedure: COLONOSCOPY WITH PROPOFOL;  Surgeon: Harvel Quale, MD;  [facility];  Service: Gastroenterology;  Laterality: N/A;  1045  ? ESOPHAGOGASTRODUODENOSCOPY (EGD) WITH PROPOFOL N/A 05/25/2020  ? Procedure: ESOPHAGOGASTRODUODENOSCOPY (EGD) WITH PROPOFOL;  Surgeon: Harvel Quale, MD;  [facility];  Service: Gastroenterology;  Laterality: N/A;  ? LUMBAR LAMINECTOMY N/A 2001  ? MEDIAL PARTIAL KNEE REPLACEMENT Bilateral 2009 & 2015  ? POLYPECTOMY  05/25/2020  ? Procedure: POLYPECTOMY;  Surgeon: Harvel Quale, MD;  [facility];  Service: Gastroenterology;;  colon  ? TOTAL HIP ARTHROPLASTY Right 2014  ? [place] N/A 1955  ? XI ROBOTIC ASSISTED HIATAL HERNIA REPAIR N/A 11/04/2020  ? Procedure: ROBOTIC REPAIR OF PARAESOPHAGEAL HIATAL HERNIA WITH TOUPET FUNDOPLICATION WITH MESH, ROBOTIC CHOLECYSTECTOMY, PARTIAL OMENTECTOMY, BILATERAL TAP BLOCK;  Surgeon: Michael Boston, MD;  Location: [facility];  Service: General;  Laterality: N/A;  ? ? ? ?SOCIAL  HISTORY:  ?Social History  ? ?Socioeconomic History  ? Marital status: Married  ?  Spouse name: Not on file  ? Number of children: 2  ? Years of education: Not on file  ? Highest education level: Not on file  ?Occupational History  ? Occupation: RETIRED  ?Tobacco Use  ? Smoking status: Never  ? Smokeless  tobacco: Never  ?Vaping Use  ? Vaping Use: Never used  ?Substance and Sexual Activity  ? Alcohol use: Never  ? Drug use: Not Currently  ? Sexual activity: Not Currently  ?Other Topics Concern  ? Not on file  ?Social History Narrative  ? Not on file  ? ?Social Determinants of Health  ? ?Financial Resource Strain: Not on file  ?Food Insecurity: Not on file  ?Transportation Needs: Not on file  ?Physical Activity: Not on file  ?Stress: Not on file  ?Social Connections: Not on file  ?Intimate Partner Violence: Not on file  ? ? ?FAMILY HISTORY:  ?Family History  ?Problem Relation Age of Onset  ? Hypertension Mother   ? Cancer Mother   ?     Endometrial cancer  ? Hypothyroidism Father   ? Arthritis Father   ? Hypertension Brother   ? Heart attack Maternal Grandfather   ? Hypertension Son   ? Healthy Son   ? ? ?CURRENT MEDICATIONS:  ?Outpatient Encounter Medications as of 03/12/2022  ?Medication Sig Note  ? acetaminophen (TYLENOL) 500 MG tablet Take 1,000 mg by mouth every 6 (six) hours as needed for moderate pain.   ? albuterol (VENTOLIN HFA) 108 (90 Base) MCG/ACT inhaler Inhale 1-2 puffs into the lungs every 6 (six) hours as needed for wheezing or shortness of breath.   ? atenolol (TENORMIN) 25 MG tablet Take 25 mg by mouth daily.    ? azelastine (ASTELIN) 0.1 % nasal spray Place 1 spray into both nostrils at bedtime.   ? cephALEXin (KEFLEX) 500 MG capsule Take 2,000 mg by mouth See admin instructions. Before dental procedures   ? fluticasone (FLONASE) 50 MCG/ACT nasal spray Place 1 spray into both nostrils daily as needed for allergies.   ? ibuprofen (ADVIL) 200 MG tablet Take 400 mg by mouth every 8 (eight) hours as needed for moderate pain.   ? levothyroxine (SYNTHROID) 100 MCG tablet Take 100 mcg by mouth daily before breakfast.   ? PREVALITE 4 GM/DOSE powder Take by mouth.   ? vitamin B-12 (CYANOCOBALAMIN) 100 MCG tablet Take 100 mcg by mouth daily.   ? Vitamin D, Ergocalciferol, (DRISDOL) 1.25 MG (50000 UT) CAPS  capsule Take 50,000 Units by mouth once a week. 10/17/2020: Mondays   ? ?No facility-administered encounter medications on file as of 03/12/2022.  ? ? ?ALLERGIES:  ?Allergies  ?Allergen Reactions  ? Cefuroxime Axetil Nausea And Vomiting  ? Skelaxin [Metaxalone] Nausea And Vomiting  ? Codeine Palpitations  ? Tincture Of Benzoin [Benzoin] Rash  ? ? ? ?PHYSICAL EXAM:  ?ECOG PERFORMANCE STATUS: 1 - Symptomatic but completely ambulatory ? ?There were no vitals filed for this visit. ?There were no vitals filed for this visit. ?Physical Exam ?Constitutional:   ?   Appearance: Normal appearance. She is obese.  ?HENT:  ?   Head: Normocephalic and atraumatic.  ?   Mouth/Throat:  ?   Mouth: Mucous membranes are moist.  ?Eyes:  ?   Extraocular Movements: Extraocular movements intact.  ?   Pupils: Pupils are equal, round, and reactive to light.  ?Cardiovascular:  ?   Rate and  Rhythm: Normal rate and regular rhythm.  ?   Pulses: Normal pulses.  ?   Heart sounds: Normal heart sounds.  ?Pulmonary:  ?   Effort: Pulmonary effort is normal.  ?   Breath sounds: Normal breath sounds.  ?Abdominal:  ?   General: Bowel sounds are normal.  ?   Palpations: Abdomen is soft.  ?   Tenderness: There is no abdominal tenderness.  ?Musculoskeletal:     ?   General: No swelling.  ?   Right lower leg: No edema.  ?   Left lower leg: No edema.  ?Lymphadenopathy:  ?   Cervical: No cervical adenopathy.  ?Skin: ?   General: Skin is warm and dry.  ?Neurological:  ?   General: No focal deficit present.  ?   Mental Status: She is alert and oriented to person, place, and time.  ?Psychiatric:     ?   Mood and Affect: Mood normal.     ?   Behavior: Behavior normal.  ? ? ? ?LABORATORY DATA:  ?I have reviewed the labs as listed.  ?CBC ?   ?Component Value Date/Time  ? WBC 8.0 03/05/2022 1018  ? RBC 4.96 03/05/2022 1018  ? HGB 14.2 03/05/2022 1018  ? HCT 45.4 03/05/2022 1018  ? PLT 178 03/05/2022 1018  ? MCV 91.5 03/05/2022 1018  ? MCH 28.6 03/05/2022 1018  ? MCHC  31.3 03/05/2022 1018  ? RDW 13.9 03/05/2022 1018  ? LYMPHSABS 1.2 03/05/2022 1018  ? MONOABS 0.8 03/05/2022 1018  ? EOSABS 0.2 03/05/2022 1018  ? BASOSABS 0.0 03/05/2022 1018  ? ? ?  Latest Ref Rng & Units 10/26/202

## 2022-03-12 ENCOUNTER — Inpatient Hospital Stay (HOSPITAL_BASED_OUTPATIENT_CLINIC_OR_DEPARTMENT_OTHER): Payer: Medicare Other | Admitting: Physician Assistant

## 2022-03-12 VITALS — BP 152/79 | HR 64 | Temp 97.9°F | Resp 16 | Ht 66.0 in | Wt 241.6 lb

## 2022-03-12 DIAGNOSIS — E538 Deficiency of other specified B group vitamins: Secondary | ICD-10-CM | POA: Diagnosis not present

## 2022-03-12 DIAGNOSIS — D508 Other iron deficiency anemias: Secondary | ICD-10-CM | POA: Diagnosis not present

## 2022-03-12 DIAGNOSIS — D509 Iron deficiency anemia, unspecified: Secondary | ICD-10-CM | POA: Diagnosis not present

## 2022-03-12 NOTE — Patient Instructions (Signed)
[place] at [facility] ?Discharge Instructions ? ?You were seen today by Tarri Abernethy PA-C for your iron deficiency.  Your iron levels are still low, despite taking the iron pill. At this time, I do not think the iron pill is doing you any good so you can stop taking it.  We will schedule you for IV iron x 2 doses.   ? ?FOLLOW-UP APPOINTMENT: Labs and phone visit in 6 months ? ? ?Thank you for choosing [place] at [facility] to provide your oncology and hematology care.  To afford each patient quality time with our provider, please arrive at least 15 minutes before your scheduled appointment time.  ? ?If you have a lab appointment with the [place] please come in thru the Main Entrance and check in at the main information desk. ? ?You need to re-schedule your appointment should you arrive 10 or more minutes late.  We strive to give you quality time with our providers, and arriving late affects you and other patients whose appointments are after yours.  Also, if you no show three or more times for appointments you may be dismissed from the clinic at the providers discretion.     ?Again, thank you for choosing [facility].  Our hope is that these requests will decrease the amount of time that you wait before being seen by our physicians.       ?_____________________________________________________________ ? ?Should you have questions after your visit to [facility], please contact our office at [phone] and follow the prompts.  Our office hours are 8:00 a.m. and 4:30 p.m. Monday - Friday.  Please note that voicemails left after 4:00 p.m. may not be returned until the following business day.  We are closed weekends and major holidays.  You do have access to a nurse 24-7, just call the main number to the clinic [phone] and do not press any options, hold on the line and a nurse will answer the phone.   ? ?For prescription  refill requests, have your pharmacy contact our office and allow 72 hours.   ? ?Due to Covid, you will need to wear a mask upon entering the hospital. If you do not have a mask, a mask will be given to you at the Main Entrance upon arrival. For doctor visits, patients may have 1 support person age 33 or older with them. For treatment visits, patients can not have anyone with them due to social distancing guidelines and our immunocompromised population.  ? ? ? ?

## 2022-03-30 ENCOUNTER — Inpatient Hospital Stay (HOSPITAL_COMMUNITY): Payer: Medicare Other | Attending: Hematology

## 2022-03-30 VITALS — BP 147/65 | HR 60 | Temp 97.5°F | Resp 16

## 2022-03-30 DIAGNOSIS — D508 Other iron deficiency anemias: Secondary | ICD-10-CM | POA: Diagnosis present

## 2022-03-30 DIAGNOSIS — D509 Iron deficiency anemia, unspecified: Secondary | ICD-10-CM

## 2022-03-30 DIAGNOSIS — E538 Deficiency of other specified B group vitamins: Secondary | ICD-10-CM | POA: Insufficient documentation

## 2022-03-30 MED ORDER — SODIUM CHLORIDE 0.9 % IV SOLN
510.0000 mg | Freq: Once | INTRAVENOUS | Status: AC
  Administered 2022-03-30: 510 mg via INTRAVENOUS
  Filled 2022-03-30: qty 510

## 2022-03-30 MED ORDER — ACETAMINOPHEN 325 MG PO TABS
650.0000 mg | ORAL_TABLET | Freq: Once | ORAL | Status: AC
  Administered 2022-03-30: 650 mg via ORAL
  Filled 2022-03-30: qty 2

## 2022-03-30 MED ORDER — FAMOTIDINE IN NACL 20-0.9 MG/50ML-% IV SOLN
20.0000 mg | Freq: Once | INTRAVENOUS | Status: AC
  Administered 2022-03-30: 20 mg via INTRAVENOUS
  Filled 2022-03-30: qty 50

## 2022-03-30 MED ORDER — METHYLPREDNISOLONE SODIUM SUCC 125 MG IJ SOLR
125.0000 mg | Freq: Once | INTRAMUSCULAR | Status: AC
  Administered 2022-03-30: 125 mg via INTRAVENOUS
  Filled 2022-03-30: qty 2

## 2022-03-30 MED ORDER — LORATADINE 10 MG PO TABS
10.0000 mg | ORAL_TABLET | Freq: Once | ORAL | Status: AC
  Administered 2022-03-30: 10 mg via ORAL
  Filled 2022-03-30: qty 1

## 2022-03-30 MED ORDER — SODIUM CHLORIDE 0.9 % IV SOLN: Freq: Once | INTRAVENOUS | Status: AC

## 2022-03-30 NOTE — Progress Notes (Signed)
Patient presents today for Feraheme infusion per providers order.  Vital signs WNL.  Patient has no new complaints at this time.    Peripheral IV started and blood return noted pre and post infusion.  Feraheme infusion  given today per MD orders.  Stable during infusion without adverse affects.  Vital signs stable.  No complaints at this time.  Discharge from clinic ambulatory in stable condition.  Alert and oriented X 3.  Follow up with [facility] as scheduled.  

## 2022-03-30 NOTE — Patient Instructions (Signed)
[facility]  Discharge Instructions: °Thank you for choosing [facility] to provide your oncology and hematology care.  °If you have a lab appointment with the Cancer Center, please come in thru the Main Entrance and check in at the main information desk. ° °Wear comfortable clothing and clothing appropriate for easy access to any Portacath or PICC line.  ° °We strive to give you quality time with your provider. You may need to reschedule your appointment if you arrive late (15 or more minutes).  Arriving late affects you and other patients whose appointments are after yours.  Also, if you miss three or more appointments without notifying the office, you may be dismissed from the clinic at the provider’s discretion.    °  °For prescription refill requests, have your pharmacy contact our office and allow 72 hours for refills to be completed.   ° °Today you received the following chemotherapy and/or immunotherapy agents Feraheme    °  °To help prevent nausea and vomiting after your treatment, we encourage you to take your nausea medication as directed. ° °BELOW ARE SYMPTOMS THAT SHOULD BE REPORTED IMMEDIATELY: °*FEVER GREATER THAN 100.4 F (38 °C) OR HIGHER °*CHILLS OR SWEATING °*NAUSEA AND VOMITING THAT IS NOT CONTROLLED WITH YOUR NAUSEA MEDICATION °*UNUSUAL SHORTNESS OF BREATH °*UNUSUAL BRUISING OR BLEEDING °*URINARY PROBLEMS (pain or burning when urinating, or frequent urination) °*BOWEL PROBLEMS (unusual diarrhea, constipation, pain near the anus) °TENDERNESS IN MOUTH AND THROAT WITH OR WITHOUT PRESENCE OF ULCERS (sore throat, sores in mouth, or a toothache) °UNUSUAL RASH, SWELLING OR PAIN  °UNUSUAL VAGINAL DISCHARGE OR ITCHING  ° °Items with * indicate a potential emergency and should be followed up as soon as possible or go to the Emergency Department if any problems should occur. ° °Please show the CHEMOTHERAPY ALERT CARD or IMMUNOTHERAPY ALERT CARD at check-in to the Emergency  Department and triage nurse. ° °Should you have questions after your visit or need to cancel or reschedule your appointment, please contact [facility] [phone]  and follow the prompts.  Office hours are 8:00 a.m. to 4:30 p.m. Monday - Friday. Please note that voicemails left after 4:00 p.m. may not be returned until the following business day.  We are closed weekends and major holidays. You have access to a nurse at all times for urgent questions. Please call the main number to the clinic [phone] and follow the prompts. ° °For any non-urgent questions, you may also contact your provider using MyChart. We now offer e-Visits for anyone 18 and older to request care online for non-urgent symptoms. For details visit [email or web address]. °  °Also download the MyChart app! Go to the app store, search "MyChart", open the app, select [place], and log in with your MyChart username and password. ° °Due to Covid, a mask is required upon entering the hospital/clinic. If you do not have a mask, one will be given to you upon arrival. For doctor visits, patients may have 1 support person aged 18 or older with them. For treatment visits, patients cannot have anyone with them due to current Covid guidelines and our immunocompromised population.  °

## 2022-04-06 ENCOUNTER — Inpatient Hospital Stay (HOSPITAL_COMMUNITY): Payer: Medicare Other

## 2022-04-06 VITALS — BP 152/70 | HR 67 | Temp 98.4°F | Resp 18

## 2022-04-06 DIAGNOSIS — D509 Iron deficiency anemia, unspecified: Secondary | ICD-10-CM

## 2022-04-06 DIAGNOSIS — D508 Other iron deficiency anemias: Secondary | ICD-10-CM | POA: Diagnosis not present

## 2022-04-06 MED ORDER — LORATADINE 10 MG PO TABS
10.0000 mg | ORAL_TABLET | Freq: Once | ORAL | Status: AC
  Administered 2022-04-06: 10 mg via ORAL
  Filled 2022-04-06: qty 1

## 2022-04-06 MED ORDER — FAMOTIDINE IN NACL 20-0.9 MG/50ML-% IV SOLN
20.0000 mg | Freq: Once | INTRAVENOUS | Status: AC
  Administered 2022-04-06: 20 mg via INTRAVENOUS
  Filled 2022-04-06: qty 50

## 2022-04-06 MED ORDER — SODIUM CHLORIDE 0.9 % IV SOLN
510.0000 mg | Freq: Once | INTRAVENOUS | Status: AC
  Administered 2022-04-06: 510 mg via INTRAVENOUS
  Filled 2022-04-06: qty 510

## 2022-04-06 MED ORDER — SODIUM CHLORIDE 0.9 % IV SOLN: Freq: Once | INTRAVENOUS | Status: AC

## 2022-04-06 MED ORDER — ACETAMINOPHEN 325 MG PO TABS
650.0000 mg | ORAL_TABLET | Freq: Once | ORAL | Status: AC
  Administered 2022-04-06: 650 mg via ORAL
  Filled 2022-04-06: qty 2

## 2022-04-06 MED ORDER — METHYLPREDNISOLONE SODIUM SUCC 125 MG IJ SOLR
125.0000 mg | Freq: Once | INTRAMUSCULAR | Status: AC
  Administered 2022-04-06: 125 mg via INTRAVENOUS
  Filled 2022-04-06: qty 2

## 2022-04-06 NOTE — Patient Instructions (Signed)
[facility]  Discharge Instructions: Thank you for choosing [facility] to provide your oncology and hematology care.  If you have a lab appointment with the Cancer Center, please come in thru the Main Entrance and check in at the main information desk.  Wear comfortable clothing and clothing appropriate for easy access to any Portacath or PICC line.   We strive to give you quality time with your provider. You may need to reschedule your appointment if you arrive late (15 or more minutes).  Arriving late affects you and other patients whose appointments are after yours.  Also, if you miss three or more appointments without notifying the office, you may be dismissed from the clinic at the provider's discretion.      For prescription refill requests, have your pharmacy contact our office and allow 72 hours for refills to be completed.    Today you received Feraheme IV iron infusion.     BELOW ARE SYMPTOMS THAT SHOULD BE REPORTED IMMEDIATELY: *FEVER GREATER THAN 100.4 F (38 C) OR HIGHER *CHILLS OR SWEATING *NAUSEA AND VOMITING THAT IS NOT CONTROLLED WITH YOUR NAUSEA MEDICATION *UNUSUAL SHORTNESS OF BREATH *UNUSUAL BRUISING OR BLEEDING *URINARY PROBLEMS (pain or burning when urinating, or frequent urination) *BOWEL PROBLEMS (unusual diarrhea, constipation, pain near the anus) TENDERNESS IN MOUTH AND THROAT WITH OR WITHOUT PRESENCE OF ULCERS (sore throat, sores in mouth, or a toothache) UNUSUAL RASH, SWELLING OR PAIN  UNUSUAL VAGINAL DISCHARGE OR ITCHING   Items with * indicate a potential emergency and should be followed up as soon as possible or go to the Emergency Department if any problems should occur.  Please show the CHEMOTHERAPY ALERT CARD or IMMUNOTHERAPY ALERT CARD at check-in to the Emergency Department and triage nurse.  Should you have questions after your visit or need to cancel or reschedule your appointment, please contact [facility]  [phone]  and follow the prompts.  Office hours are 8:00 a.m. to 4:30 p.m. Monday - Friday. Please note that voicemails left after 4:00 p.m. may not be returned until the following business day.  We are closed weekends and major holidays. You have access to a nurse at all times for urgent questions. Please call the main number to the clinic [phone] and follow the prompts.  For any non-urgent questions, you may also contact your provider using MyChart. We now offer e-Visits for anyone 18 and older to request care online for non-urgent symptoms. For details visit [email or web address].   Also download the MyChart app! Go to the app store, search "MyChart", open the app, select [place], and log in with your MyChart username and password.  Due to Covid, a mask is required upon entering the hospital/clinic. If you do not have a mask, one will be given to you upon arrival. For doctor visits, patients may have 1 support person aged 18 or older with them. For treatment visits, patients cannot have anyone with them due to current Covid guidelines and our immunocompromised population.  

## 2022-04-06 NOTE — Progress Notes (Signed)
Pt presents today for Feraheme IV iron infusion per provider's order. Vital signs stable and pt voiced no new complaints at this time.  Peripheral IV started with good blood return pre and post infusion.  Feraheme given today per MD orders. Tolerated infusion without adverse affects. Vital signs stable. No complaints at this time. Discharged from clinic ambulatory in stable condition. Alert and oriented x 3. F/U with [facility] as scheduled.    

## 2022-09-07 ENCOUNTER — Inpatient Hospital Stay: Payer: Medicare Other | Attending: Hematology

## 2022-09-07 DIAGNOSIS — E538 Deficiency of other specified B group vitamins: Secondary | ICD-10-CM | POA: Insufficient documentation

## 2022-09-07 DIAGNOSIS — R519 Headache, unspecified: Secondary | ICD-10-CM | POA: Insufficient documentation

## 2022-09-07 DIAGNOSIS — Z79899 Other long term (current) drug therapy: Secondary | ICD-10-CM | POA: Insufficient documentation

## 2022-09-07 DIAGNOSIS — D509 Iron deficiency anemia, unspecified: Secondary | ICD-10-CM | POA: Diagnosis not present

## 2022-09-07 DIAGNOSIS — M2550 Pain in unspecified joint: Secondary | ICD-10-CM | POA: Insufficient documentation

## 2022-09-07 LAB — CBC WITH DIFFERENTIAL/PLATELET
Abs Immature Granulocytes: 0.04 10*3/uL (ref 0.00–0.07)
Basophils Absolute: 0 10*3/uL (ref 0.0–0.1)
Basophils Relative: 0 %
Eosinophils Absolute: 0.1 10*3/uL (ref 0.0–0.5)
Eosinophils Relative: 1 %
HCT: 44.1 % (ref 36.0–46.0)
Hemoglobin: 14.3 g/dL (ref 12.0–15.0)
Immature Granulocytes: 1 %
Lymphocytes Relative: 11 %
Lymphs Abs: 0.9 10*3/uL (ref 0.7–4.0)
MCH: 30.6 pg (ref 26.0–34.0)
MCHC: 32.4 g/dL (ref 30.0–36.0)
MCV: 94.2 fL (ref 80.0–100.0)
Monocytes Absolute: 0.7 10*3/uL (ref 0.1–1.0)
Monocytes Relative: 9 %
Neutro Abs: 6.2 10*3/uL (ref 1.7–7.7)
Neutrophils Relative %: 78 %
Platelets: 195 10*3/uL (ref 150–400)
RBC: 4.68 MIL/uL (ref 3.87–5.11)
RDW: 13.5 % (ref 11.5–15.5)
WBC: 7.9 10*3/uL (ref 4.0–10.5)
nRBC: 0 % (ref 0.0–0.2)

## 2022-09-07 LAB — FERRITIN: Ferritin: 137 ng/mL (ref 11–307)

## 2022-09-07 LAB — IRON AND TIBC
Iron: 120 ug/dL (ref 28–170)
Saturation Ratios: 33 % — ABNORMAL HIGH (ref 10.4–31.8)
TIBC: 365 ug/dL (ref 250–450)
UIBC: 245 ug/dL

## 2022-09-07 LAB — VITAMIN B12: Vitamin B-12: 353 pg/mL (ref 180–914)

## 2022-09-10 LAB — METHYLMALONIC ACID, SERUM: Methylmalonic Acid, Quantitative: 162 nmol/L (ref 0–378)

## 2022-09-14 ENCOUNTER — Inpatient Hospital Stay (HOSPITAL_BASED_OUTPATIENT_CLINIC_OR_DEPARTMENT_OTHER): Payer: Medicare Other | Admitting: Physician Assistant

## 2022-09-14 DIAGNOSIS — E538 Deficiency of other specified B group vitamins: Secondary | ICD-10-CM

## 2022-09-14 DIAGNOSIS — D500 Iron deficiency anemia secondary to blood loss (chronic): Secondary | ICD-10-CM | POA: Diagnosis not present

## 2022-09-14 NOTE — Progress Notes (Signed)
Virtual Visit via Telephone Note [facility]  I connected with Annette Frank  on 09/14/22 at  2:28 PM by telephone and verified that I am speaking with the correct person using two identifiers.  Location: Patient: Home Provider: [facility]   I discussed the limitations, risks, security and privacy concerns of performing an evaluation and management service by telephone and the availability of in person appointments. I also discussed with the patient that there may be a patient responsible charge related to this service. The patient expressed understanding and agreed to proceed.  REASON FOR VISIT:  Follow-up for iron deficiency anemia   PRIOR THERAPY: PRBC transfusions and intermittent IV iron   CURRENT THERAPY: Ferrous bisglycinate + oral B12 supplement   INTERVAL HISTORY:  Annette Frank 71 y.o. female returns for routine follow-up of her iron deficiency and B12 deficiency.  She was last seen by Annette Abernethy PA-C on 03/12/2022.   Patient felt improved energy after her IV Feraheme in June 2023, although she experienced her usual postinfusion side effects of nausea, upset stomach, diarrhea, and aching joints that lasted for 2 to 3 days.  She denies any signs of blood loss such as bright red blood per rectum, melena, or epistaxis.  No chest pain or dyspnea on exertion.  She reports headaches and joint pain.  She denies any pica, restless legs, chest pain, lightheadedness, or syncope. She continues to take vitamin B12 tablet daily, although she was unable to tolerate iron tablets due to GI side effects.   She has 60% energy and 100% appetite. She endorses that she is maintaining a stable weight.    OBSERVATIONS/OBJECTIVE: Review of Systems  Constitutional:  Positive for malaise/fatigue (chronic, baseline). Negative for chills, diaphoresis, fever and weight loss.  Respiratory:  Negative for cough and shortness of breath.   Cardiovascular:  Negative for chest  pain and palpitations.  Gastrointestinal:  Positive for nausea. Negative for abdominal pain, blood in stool, melena and vomiting.  Musculoskeletal:  Positive for joint pain.  Neurological:  Positive for headaches. Negative for dizziness.  Psychiatric/Behavioral:  The patient has insomnia.      PHYSICAL EXAM (per limitations of virtual telephone visit): The patient is alert and oriented x 3, exhibiting adequate mentation, good mood, and ability to speak in full sentences and execute sound judgement.   ASSESSMENT & PLAN: 1.  Iron deficiency anemia: - Ongoing anemia since 2010.  Patient has been extensively worked up for her anemia.  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  No evidence of hematologic neoplasm or morphology.  Normal cytogenetics.  No increase in blasts on flow cytometry. - Patient is unable to tolerate p.o. iron supplements due to GI upset (failed trial of both ferrous sulfate and ferrous bisglycinate) - Patient is unable to tolerate Venofer due to flulike symptoms.  She does not respond to IV Ferrlecit.  She tolerates Feraheme with steroid/premedications, although she still has some arthralgia and GI upset following and fusions. - Most recent IV iron with Feraheme x2 in June 2023 - No current signs or symptoms of  blood loss such as epistaxis, hematemesis, hematochezia, or melena  - Most recent labs (09/07/2022): Hgb 14.2/MCV 94.2, ferritin 137, iron saturation 33% - PLAN: No indication for IV iron at this time - Recommend repeat labs and RTC with  phone visit in 6 months.     2.  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  Recheck vitamin B12 and methylmalonic acid in 6 months.     PLAN SUMMARY: >> Labs in 6 months (CBC/D, ferritin, iron/TIBC, B12, MMA) >> PHONE visit 1 week after labs    I discussed the assessment and treatment plan with the patient. The patient was provided an opportunity to ask questions and all were answered. The patient agreed with the plan and demonstrated an understanding of the instructions.   The patient was advised to call back or seek an in-person evaluation if the symptoms worsen or if the condition fails to improve as anticipated.  I provided 18 minutes of non-face-to-face time during this encounter.   Harriett Rush, PA-C 09/14/22 2:52 PM

## 2022-09-17 ENCOUNTER — Other Ambulatory Visit: Payer: Self-pay

## 2022-09-17 DIAGNOSIS — D649 Anemia, unspecified: Secondary | ICD-10-CM

## 2022-09-17 DIAGNOSIS — D500 Iron deficiency anemia secondary to blood loss (chronic): Secondary | ICD-10-CM

## 2022-09-17 DIAGNOSIS — E538 Deficiency of other specified B group vitamins: Secondary | ICD-10-CM

## 2023-03-11 ENCOUNTER — Inpatient Hospital Stay: Payer: Medicare Other | Attending: Physician Assistant

## 2023-03-11 DIAGNOSIS — D649 Anemia, unspecified: Secondary | ICD-10-CM

## 2023-03-11 DIAGNOSIS — D509 Iron deficiency anemia, unspecified: Secondary | ICD-10-CM | POA: Insufficient documentation

## 2023-03-11 DIAGNOSIS — E538 Deficiency of other specified B group vitamins: Secondary | ICD-10-CM

## 2023-03-11 DIAGNOSIS — D500 Iron deficiency anemia secondary to blood loss (chronic): Secondary | ICD-10-CM

## 2023-03-11 LAB — CBC WITH DIFFERENTIAL/PLATELET
Abs Immature Granulocytes: 0.05 10*3/uL (ref 0.00–0.07)
Basophils Absolute: 0 10*3/uL (ref 0.0–0.1)
Basophils Relative: 1 %
Eosinophils Absolute: 0.2 10*3/uL (ref 0.0–0.5)
Eosinophils Relative: 2 %
HCT: 45.7 % (ref 36.0–46.0)
Hemoglobin: 14.7 g/dL (ref 12.0–15.0)
Immature Granulocytes: 1 %
Lymphocytes Relative: 19 %
Lymphs Abs: 1.5 10*3/uL (ref 0.7–4.0)
MCH: 29.8 pg (ref 26.0–34.0)
MCHC: 32.2 g/dL (ref 30.0–36.0)
MCV: 92.7 fL (ref 80.0–100.0)
Monocytes Absolute: 0.7 10*3/uL (ref 0.1–1.0)
Monocytes Relative: 9 %
Neutro Abs: 5.4 10*3/uL (ref 1.7–7.7)
Neutrophils Relative %: 68 %
Platelets: 220 10*3/uL (ref 150–400)
RBC: 4.93 MIL/uL (ref 3.87–5.11)
RDW: 13.7 % (ref 11.5–15.5)
WBC: 7.8 10*3/uL (ref 4.0–10.5)
nRBC: 0 % (ref 0.0–0.2)

## 2023-03-11 LAB — VITAMIN B12: Vitamin B-12: 360 pg/mL (ref 180–914)

## 2023-03-11 LAB — IRON AND TIBC
Iron: 78 ug/dL (ref 28–170)
Saturation Ratios: 22 % (ref 10.4–31.8)
TIBC: 348 ug/dL (ref 250–450)
UIBC: 270 ug/dL

## 2023-03-11 LAB — FERRITIN: Ferritin: 127 ng/mL (ref 11–307)

## 2023-03-13 LAB — METHYLMALONIC ACID, SERUM: Methylmalonic Acid, Quantitative: 182 nmol/L (ref 0–378)

## 2023-03-17 NOTE — Progress Notes (Signed)
VIRTUAL VISIT via TELEPHONE NOTE [facility]   I connected with Annette Frank  on 03/18/23 at  2:25 PM by telephone and verified that I am speaking with the correct person using two identifiers.  Location: Patient: Home Provider: [facility]   I discussed the limitations, risks, security and privacy concerns of performing an evaluation and management service by telephone and the availability of in person appointments. I also discussed with the patient that there may be a patient responsible charge related to this service. The patient expressed understanding and agreed to proceed.  REASON FOR VISIT:  Follow-up for iron deficiency anemia   PRIOR THERAPY: PRBC transfusions and intermittent IV iron   CURRENT THERAPY: Ferrous bisglycinate + oral B12 supplement  INTERVAL HISTORY:  [name] is contacted today for follow-up of iron deficiency and B12 deficiency.  She was last evaluated via telemedicine visit by Rojelio Brenner PA-C on 09/14/2022.  She denies any signs of blood loss such as bright red blood per rectum, melena, or epistaxis.  She reports energy is at baseline, denies any abnormal fatigue.  No chest pain or dyspnea on exertion. She reports headaches.  She denies any pica, restless legs, chest pain, lightheadedness, or syncope.  She continues to take vitamin B12 tablet daily, although she was unable to tolerate iron tablets due to GI side effects.   She has 75% energy and 100% appetite. She endorses that she is maintaining a stable weight.   REVIEW OF SYSTEMS:   Review of Systems  Constitutional:  Negative for chills, diaphoresis, fever, malaise/fatigue and weight loss.  Respiratory:  Negative for cough and shortness of breath.   Cardiovascular:  Negative for chest pain and palpitations.  Gastrointestinal:  Positive for diarrhea. Negative for abdominal pain, blood in stool, melena, nausea and vomiting.  Musculoskeletal:  Positive for  joint pain (left hip bursitis).  Neurological:  Positive for headaches. Negative for dizziness.     PHYSICAL EXAM: (per limitations of virtual telephone visit)  The patient is alert and oriented x 3, exhibiting adequate mentation, good mood, and ability to speak in full sentences and execute sound judgement.  ASSESSMENT & PLAN:  1.  Iron deficiency anemia: - Ongoing anemia since 2010.  Patient has been extensively worked up for her anemia.  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  No evidence of hematologic neoplasm or morphology.  Normal cytogenetics.  No increase in blasts on flow cytometry. - Patient is unable to tolerate p.o. iron supplements due to GI upset (failed trial of both ferrous sulfate and ferrous bisglycinate) - Patient is unable to tolerate Venofer due to flulike symptoms.  She does not respond to IV Ferrlecit.  She tolerates Feraheme with steroid/premedications, although she still has some arthralgia and GI upset following and fusions. - Most recent IV iron with Feraheme x2 in June 2023 - No current signs or symptoms of blood loss such as epistaxis, hematemesis, hematochezia, or melena - Most recent labs (03/11/2023): Hgb 14.7/MCV 92.7, ferritin 127, iron saturation 22 % - PLAN: No indication for IV iron at  this time - Recommend repeat labs and RTC with OFFICE visit in 1 year    2.  Vitamin B12 deficiency - She was taking vitamin B12 cyanocobalamin 1 mg tablet daily, but was noted to have decreasing vitamin B12 despite oral supplementation, likely  in the setting of malabsorption - She was given vitamin B12 injection x1 on 06/07/2021 - She continues to take B12 cyanocobalamin 1 mg tablet daily - Most recent vitamin B12 (03/11/2023) is stable at 360, with normal methylmalonic acid  - PLAN: Continue B12 tablet daily.  Recheck vitamin B12 and methylmalonic acid in 1 year.  PLAN SUMMARY: >> Labs in 1 year = CBC/D, ferritin, iron/TIBC, B12, MMA >> OFFICE visit in 1 year (1 week after labs)     I discussed the assessment and treatment plan with the patient. The patient was provided an opportunity to ask questions and all were answered. The patient agreed with the plan and demonstrated an understanding of the instructions.   The patient was advised to call back or seek an in-person evaluation if the symptoms worsen or if the condition fails to improve as anticipated.  I provided 15 minutes of non-face-to-face time during this encounter.  Carnella Guadalajara, PA-C 03/18/23 2:41 PM

## 2023-03-18 ENCOUNTER — Inpatient Hospital Stay (HOSPITAL_BASED_OUTPATIENT_CLINIC_OR_DEPARTMENT_OTHER): Payer: Medicare Other | Admitting: Physician Assistant

## 2023-03-18 ENCOUNTER — Other Ambulatory Visit: Payer: Self-pay

## 2023-03-18 ENCOUNTER — Encounter: Payer: Self-pay | Admitting: Physician Assistant

## 2023-03-18 DIAGNOSIS — D500 Iron deficiency anemia secondary to blood loss (chronic): Secondary | ICD-10-CM

## 2023-03-18 DIAGNOSIS — E538 Deficiency of other specified B group vitamins: Secondary | ICD-10-CM

## 2024-03-09 ENCOUNTER — Inpatient Hospital Stay: Payer: Medicare Other | Attending: Hematology

## 2024-03-09 DIAGNOSIS — D500 Iron deficiency anemia secondary to blood loss (chronic): Secondary | ICD-10-CM

## 2024-03-09 DIAGNOSIS — E538 Deficiency of other specified B group vitamins: Secondary | ICD-10-CM | POA: Insufficient documentation

## 2024-03-09 DIAGNOSIS — D509 Iron deficiency anemia, unspecified: Secondary | ICD-10-CM | POA: Diagnosis present

## 2024-03-09 LAB — CBC WITH DIFFERENTIAL/PLATELET
Abs Immature Granulocytes: 0.04 10*3/uL (ref 0.00–0.07)
Basophils Absolute: 0 10*3/uL (ref 0.0–0.1)
Basophils Relative: 1 %
Eosinophils Absolute: 0.2 10*3/uL (ref 0.0–0.5)
Eosinophils Relative: 3 %
HCT: 44.7 % (ref 36.0–46.0)
Hemoglobin: 14.6 g/dL (ref 12.0–15.0)
Immature Granulocytes: 1 %
Lymphocytes Relative: 17 %
Lymphs Abs: 1.2 10*3/uL (ref 0.7–4.0)
MCH: 30.1 pg (ref 26.0–34.0)
MCHC: 32.7 g/dL (ref 30.0–36.0)
MCV: 92.2 fL (ref 80.0–100.0)
Monocytes Absolute: 0.7 10*3/uL (ref 0.1–1.0)
Monocytes Relative: 11 %
Neutro Abs: 4.6 10*3/uL (ref 1.7–7.7)
Neutrophils Relative %: 67 %
Platelets: 216 10*3/uL (ref 150–400)
RBC: 4.85 MIL/uL (ref 3.87–5.11)
RDW: 13.9 % (ref 11.5–15.5)
WBC: 6.8 10*3/uL (ref 4.0–10.5)
nRBC: 0 % (ref 0.0–0.2)

## 2024-03-09 LAB — FERRITIN: Ferritin: 62 ng/mL (ref 11–307)

## 2024-03-09 LAB — VITAMIN B12: Vitamin B-12: 196 pg/mL (ref 180–914)

## 2024-03-09 LAB — IRON AND TIBC
Iron: 73 ug/dL (ref 28–170)
Saturation Ratios: 19 % (ref 10.4–31.8)
TIBC: 380 ug/dL (ref 250–450)
UIBC: 307 ug/dL

## 2024-03-11 LAB — METHYLMALONIC ACID, SERUM: Methylmalonic Acid, Quantitative: 205 nmol/L (ref 0–378)

## 2024-03-14 NOTE — Progress Notes (Signed)
 [facility] [address], [place] [address]   CLINIC:  Medical Oncology/Hematology  PCP:  Elliott, Dianne E [address] [place] [address] [phone]   REASON FOR VISIT:  Follow-up for iron  deficiency anemia   PRIOR THERAPY: PRBC transfusions and intermittent IV iron    CURRENT THERAPY: Ferrous bisglycinate + oral B12 supplement  INTERVAL HISTORY:  [name] is seen in clinic today for follow-up of iron  deficiency and B12 deficiency.  She was last evaluated via telemedicine visit by Sheril Dines PA-C on 03/18/2023.    At today's visit, patient reports progressive fatigue over the past year which she attributes to several interim health events, including left hip replacement on 07/29/2023 and pneumonia in February 2025.  She denies any signs of blood loss such as bright red blood per rectum, melena, or epistaxis.  She reports some dyspnea on exertion and headaches.  She denies any pica, chest pain, lightheadedness, or syncope.  She has not taken her vitamin B12 tablet since it was discontinued at the time for left hip replacement (September 2024).  She reports numbness and tingling in her left leg and left hand since hip replacement.  She has some "mental fog and sluggishness" that she attributes to her general fatigue.  She has little to no energy and 100% appetite. She endorses that she is maintaining a stable weight.  ASSESSMENT & PLAN:  1.  Iron  deficiency anemia: - Ongoing anemia since 2010.  Patient has been extensively worked up for her anemia.  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  No evidence of hematologic neoplasm or morphology.  Normal cytogenetics.  No increase in blasts on flow cytometry. - Patient is unable to tolerate p.o. iron  supplements due to GI upset (failed trial of both ferrous sulfate and ferrous bisglycinate) - Patient is unable to tolerate Venofer due to flulike symptoms.  She does not respond to IV Ferrlecit.  She tolerates Feraheme  with steroid/premedications, although she still has some arthralgia and GI upset following and fusions. - Most recent IV iron  with Feraheme  x2 in June 2023 - No current signs or symptoms of blood loss such as epistaxis, hematemesis, hematochezia, or melena - Most recent labs (03/09/2024): Hgb 14.6/MCV 92.2, ferritin 62, iron  saturation 19% - PLAN: IV Feraheme  x 1 with PREMEDICATION - Recommend repeat labs and RTC with OFFICE visit in 6 months   2.  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  Rx to pharmacy for vitamin B12 1000 mcg daily.  Recheck vitamin B12 and methylmalonic acid in 6 months  PLAN SUMMARY: >> Vitamin B12 injection given in  clinic TODAY >> IV Feraheme  x1 (with PREMEDS) >> Labs in 6 months = CBC/D, ferritin, iron /TIBC, B12, MMA >> PHONE visit in 6 months (1 week after labs)     REVIEW OF SYSTEMS:   Review of Systems  Constitutional:  Positive for fatigue. Negative for appetite change, chills, diaphoresis, fever and unexpected weight change.  HENT:   Negative for lump/mass  and nosebleeds.   Eyes:  Negative for eye problems.  Respiratory:  Positive for shortness of breath (with exertion). Negative for cough and hemoptysis.   Cardiovascular:  Negative for chest pain, leg swelling and palpitations.  Gastrointestinal:  Positive for diarrhea. Negative for abdominal pain, blood in stool, constipation, nausea and vomiting.  Genitourinary:  Negative for dysuria and hematuria.   Musculoskeletal:  Positive for flank pain (left flank pain, worse with movement).  Skin: Negative.   Neurological:  Positive for headaches. Negative for dizziness and light-headedness.  Hematological:  Does not bruise/bleed easily.     PHYSICAL EXAM:  ECOG PERFORMANCE STATUS: 1 - Symptomatic but completely ambulatory  Vitals:   03/16/24 1353 03/16/24 1358  BP: (!) 149/81 (!) 155/71  Pulse: 76   Resp: 18   Temp: 98.2 F (36.8 C)   SpO2: 99%    Filed Weights   03/16/24 1353  Weight: 248 lb 14.4 oz (112.9 kg)   Physical Exam Constitutional:      Appearance: Normal appearance. She is obese.  Cardiovascular:     Heart sounds: Normal heart sounds.  Pulmonary:     Breath sounds: Normal breath sounds.  Neurological:     General: No focal deficit present.     Mental Status: Mental status is at baseline.  Psychiatric:        Behavior: Behavior normal. Behavior is cooperative.     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   Dysphagia    Heartburn    Hiatal hernia    Hypothyroidism    Obesity    Thyroid disease    Past Surgical History:  Procedure Laterality Date   ABDOMINAL HYSTERECTOMY     BIOPSY  05/25/2020   Procedure: BIOPSY;  Surgeon: Urban Garden, MD;  [facility];  Service: Gastroenterology;;  duodenum   COLONOSCOPY WITH PROPOFOL  N/A 05/25/2020   Procedure: COLONOSCOPY WITH PROPOFOL ;  Surgeon: Urban Garden, MD;  [facility]: [facility];  Service: Gastroenterology;  Laterality: N/A;  1045   ESOPHAGOGASTRODUODENOSCOPY (EGD) WITH PROPOFOL  N/A 05/25/2020   Procedure: ESOPHAGOGASTRODUODENOSCOPY (EGD) WITH PROPOFOL ;  Surgeon: Urban Garden, MD;  [facility];  Service: Gastroenterology;  Laterality: N/A;   LUMBAR LAMINECTOMY N/A 2001   MEDIAL PARTIAL KNEE REPLACEMENT Bilateral 2009 & 2015   POLYPECTOMY  05/25/2020   Procedure: POLYPECTOMY;  Surgeon: Urban Garden, MD;  Location: [facility];  Service: Gastroenterology;;  colon   TOTAL HIP ARTHROPLASTY Right 2014   UMBILICAL HERNIA REPAIR N/A 1955   XI ROBOTIC ASSISTED HIATAL HERNIA REPAIR N/A 11/04/2020   Procedure: ROBOTIC REPAIR OF PARAESOPHAGEAL HIATAL HERNIA WITH TOUPET FUNDOPLICATION WITH MESH, ROBOTIC CHOLECYSTECTOMY, PARTIAL OMENTECTOMY, BILATERAL TAP BLOCK;  Surgeon: Candyce Champagne, MD;  Location: [facility];  Service: General;  Laterality: N/A;    SOCIAL HISTORY:  Social History   Socioeconomic History   Marital status: Married    Spouse name: Not on file  Number of children: 2   Years of education: Not on file   Highest education level: Not on file  Occupational History   Occupation: RETIRED  Tobacco Use   Smoking status: Never   Smokeless tobacco: Never  Vaping Use   Vaping status: Never Used  Substance and Sexual Activity   Alcohol use: Never   Drug use: Not Currently   Sexual activity: Not Currently  Other Topics Concern   Not on file  Social History Narrative   Not on file   Social Drivers of Health   Financial Resource Strain: Low Risk  (10/19/2019)   Overall Financial Resource Strain (CARDIA)    Difficulty of Paying Living Expenses: Not hard at all  Food Insecurity: No Food Insecurity (10/19/2019)   Hunger Vital Sign    Worried About Running Out of Food in the Last Year: Never true    Ran Out of Food in the Last Year: Never true  Transportation Needs: No Transportation Needs (10/19/2019)   PRAPARE -  Administrator, Civil Service (Medical): No    Lack of Transportation (Non-Medical): No  Physical Activity: Inactive (10/19/2019)   Exercise Vital Sign    Days of Exercise per Week: 0 days    Minutes of Exercise per Session: 0 min  Stress: No Stress Concern Present (10/19/2019)   [employer] of Occupational Health - Occupational Stress Questionnaire    Feeling of Stress : Not at all  Social Connections: Moderately Integrated (10/19/2019)   Social Connection and Isolation Panel [NHANES]    Frequency of Communication with Friends and Family: Three times a week    Frequency of Social Gatherings with Friends and Family: Never    Attends Religious Services: More than 4 times per year    Active Member of [employer] or Organizations: No    Attends Banker Meetings: Never    Marital Status: Married  Catering manager Violence: Not At Risk (10/19/2019)   Humiliation, Afraid, Rape, and Kick questionnaire    Fear of Current or Ex-Partner: No    Emotionally Abused: No    Physically Abused: No    Sexually Abused: No    FAMILY HISTORY:  Family History  Problem Relation Age of Onset   Hypertension Mother    Cancer Mother        Endometrial cancer   Hypothyroidism Father    Arthritis Father    Hypertension Brother    Heart attack Maternal Grandfather    Hypertension Son    Healthy Son     CURRENT MEDICATIONS:  Outpatient Encounter Medications as of 03/16/2024  Medication Sig Note   acetaminophen  (TYLENOL ) 500 MG tablet Take 1,000 mg by mouth every 6 (six) hours as needed for moderate pain.    albuterol  (VENTOLIN  HFA) 108 (90 Base) MCG/ACT inhaler Inhale 1-2 puffs into the lungs every 6 (six) hours as needed for wheezing or shortness of breath.    atenolol (TENORMIN) 25 MG tablet Take 25 mg by mouth daily.     azelastine  (ASTELIN ) 0.1 % nasal spray Place 1 spray into both nostrils at bedtime.    baclofen (LIORESAL) 10 MG tablet TAKE 1 TABLET BY MOUTH 2 OR 3 TIMES  DAILY AS NEEDED    cephALEXin (KEFLEX) 500 MG capsule Take 2,000 mg by mouth See admin instructions. Before dental procedures    cyanocobalamin  (VITAMIN B12) 1000 MCG tablet Take 1 tablet (1,000 mcg total) by mouth daily.    ibuprofen (ADVIL) 200 MG tablet Take 400  mg by mouth every 8 (eight) hours as needed for moderate pain.    levothyroxine  (SYNTHROID ) 125 MCG tablet Take 125 mcg by mouth every morning.    PREVALITE 4 GM/DOSE powder Take by mouth.    Vitamin D, Ergocalciferol, (DRISDOL) 1.25 MG (50000 UT) CAPS capsule Take 50,000 Units by mouth once a week. 10/17/2020: Mondays    [DISCONTINUED] vitamin B-12 (CYANOCOBALAMIN ) 100 MCG tablet Take 100 mcg by mouth daily.    No facility-administered encounter medications on file as of 03/16/2024.    ALLERGIES:  Allergies  Allergen Reactions   Cefuroxime Axetil Nausea And Vomiting   Skelaxin [Metaxalone] Nausea And Vomiting   Codeine Palpitations   Tincture Of Benzoin [Benzoin] Rash    LABORATORY DATA:  I have reviewed the labs as listed.  CBC    Component Value Date/Time   WBC 6.8 03/09/2024 1326   RBC 4.85 03/09/2024 1326   HGB 14.6 03/09/2024 1326   HCT 44.7 03/09/2024 1326   PLT 216 03/09/2024 1326   MCV 92.2 03/09/2024 1326   MCH 30.1 03/09/2024 1326   MCHC 32.7 03/09/2024 1326   RDW 13.9 03/09/2024 1326   LYMPHSABS 1.2 03/09/2024 1326   MONOABS 0.7 03/09/2024 1326   EOSABS 0.2 03/09/2024 1326   BASOSABS 0.0 03/09/2024 1326      Latest Ref Rng & Units 08/23/2021   10:57 AM 11/11/2020   10:52 AM 11/06/2020    5:34 AM  CMP  Glucose 70 - 99 mg/dL 161  096    BUN 8 - 23 mg/dL 11  11    Creatinine 0.45 - 1.00 mg/dL 4.09  8.11    Sodium 914 - 145 mmol/L 136  139    Potassium 3.5 - 5.1 mmol/L 4.1  4.2  4.6   Chloride 98 - 111 mmol/L 103  104    CO2 22 - 32 mmol/L 25  25    Calcium 8.9 - 10.3 mg/dL 9.5  9.2    Total Protein 6.5 - 8.1 g/dL 7.1  6.7    Total Bilirubin 0.3 - 1.2 mg/dL 0.8  0.5    Alkaline Phos 38 - 126 U/L  109  80    AST 15 - 41 U/L 22  15    ALT 0 - 44 U/L 25  42      DIAGNOSTIC IMAGING:  I have independently reviewed the relevant imaging and discussed with the patient.   WRAP UP:  All questions were answered. The patient knows to call the clinic with any problems, questions or concerns.  Medical decision making: Moderate  Time spent on visit: I spent 20 minutes counseling the patient face to face. The total time spent in the appointment was 30 minutes and more than 50% was on counseling.  Sonnie Dusky, PA-C  03/16/24 3:00 PM

## 2024-03-16 ENCOUNTER — Inpatient Hospital Stay: Payer: Medicare Other | Admitting: Physician Assistant

## 2024-03-16 ENCOUNTER — Inpatient Hospital Stay

## 2024-03-16 VITALS — BP 155/71 | HR 76 | Temp 98.2°F | Resp 18 | Wt 248.9 lb

## 2024-03-16 DIAGNOSIS — E538 Deficiency of other specified B group vitamins: Secondary | ICD-10-CM | POA: Diagnosis not present

## 2024-03-16 DIAGNOSIS — D509 Iron deficiency anemia, unspecified: Secondary | ICD-10-CM | POA: Diagnosis not present

## 2024-03-16 DIAGNOSIS — D500 Iron deficiency anemia secondary to blood loss (chronic): Secondary | ICD-10-CM | POA: Diagnosis not present

## 2024-03-16 MED ORDER — VITAMIN B-12 1000 MCG PO TABS: 1000.0000 ug | ORAL_TABLET | Freq: Every day | ORAL | 3 refills | Status: AC

## 2024-03-16 MED ORDER — CYANOCOBALAMIN 1000 MCG/ML IJ SOLN
1000.0000 ug | Freq: Once | INTRAMUSCULAR | Status: AC
  Administered 2024-03-16: 1000 ug via INTRAMUSCULAR
  Filled 2024-03-16: qty 1

## 2024-03-16 NOTE — Progress Notes (Signed)
 Patient seen by Sheril Dines PA today. B12 injection order given. Patient tolerated injection in left deltoid with no complaints voiced.  Site clean and dry with no bruising or swelling noted.  No complaints of pain.

## 2024-03-16 NOTE — Patient Instructions (Signed)
 [facility] [place] - A DEPT OF [facility]  Discharge Instructions: Thank you for choosing [facility] to provide your oncology and hematology care.  If you have a lab appointment with the Cancer Center - please note that after April 8th, 2024, all labs will be drawn in the cancer center.  You do not have to check in or register with the main entrance as you have in the past but will complete your check-in in the cancer center.  Wear comfortable clothing and clothing appropriate for easy access to any Portacath or PICC line.   We strive to give you quality time with your provider. You may need to reschedule your appointment if you arrive late (15 or more minutes).  Arriving late affects you and other patients whose appointments are after yours.  Also, if you miss three or more appointments without notifying the office, you may be dismissed from the clinic at the provider's discretion.      For prescription refill requests, have your pharmacy contact our office and allow 72 hours for refills to be completed.    Today you received the following B12 Injection.  Vitamin B12 Injection What is this medication? Vitamin B12 (VAHY tuh min B12) prevents and treats low vitamin B12 levels in your body. It is used in people who do not get enough vitamin B12 from their diet or when their digestive tract does not absorb enough. Vitamin B12 plays an important role in maintaining the health of your nervous system and red blood cells. This medicine may be used for other purposes; ask your health care provider or pharmacist if you have questions. COMMON BRAND NAME(S): B-12 Compliance Kit, B-12 Injection Kit, Cyomin, Dodex , LA-12, Nutri-Twelve, Physicians EZ Use B-12, Primabalt, Vitamin Deficiency Injectable System - B12 What should I tell my care team before I take this medication? They need to know if you have any of these conditions: Kidney disease Leber's  disease Megaloblastic anemia An unusual or allergic reaction to cyanocobalamin , cobalt, other medications, foods, dyes, or preservatives Pregnant or trying to get pregnant Breast-feeding How should I use this medication? This medication is injected into a muscle or deeply under the skin. It is usually given in a clinic or care team's office. However, your care team may teach you how to inject yourself. Follow all instructions. Talk to your care team about the use of this medication in children. Special care may be needed. Overdosage: If you think you have taken too much of this medicine contact a poison control center or emergency room at once. NOTE: This medicine is only for you. Do not share this medicine with others. What if I miss a dose? If you are given your dose at a clinic or care team's office, call to reschedule your appointment. If you give your own injections, and you miss a dose, take it as soon as you can. If it is almost time for your next dose, take only that dose. Do not take double or extra doses. What may interact with this medication? Alcohol Colchicine This list may not describe all possible interactions. Give your health care provider a list of all the medicines, herbs, non-prescription drugs, or dietary supplements you use. Also tell them if you smoke, drink alcohol, or use illegal drugs. Some items may interact with your medicine. What should I watch for while using this medication? Visit your care team regularly. You may need blood work done while you are taking  this medication. You may need to follow a special diet. Talk to your care team. Limit your alcohol intake and avoid smoking to get the best benefit. What side effects may I notice from receiving this medication? Side effects that you should report to your care team as soon as possible: Allergic reactions--skin rash, itching, hives, swelling of the face, lips, tongue, or throat Swelling of the ankles, hands, or  feet Trouble breathing Side effects that usually do not require medical attention (report to your care team if they continue or are bothersome): Diarrhea This list may not describe all possible side effects. Call your doctor for medical advice about side effects. You may report side effects to FDA at 1-800-FDA-1088. Where should I keep my medication? Keep out of the reach of children. Store at room temperature between 15 and 30 degrees C (59 and 85 degrees F). Protect from light. Throw away any unused medication after the expiration date. NOTE: This sheet is a summary. It may not cover all possible information. If you have questions about this medicine, talk to your doctor, pharmacist, or health care provider.  2024 Elsevier/Gold Standard (2021-06-27 00:00:00)   To help prevent nausea and vomiting after your treatment, we encourage you to take your nausea medication as directed.  BELOW ARE SYMPTOMS THAT SHOULD BE REPORTED IMMEDIATELY: *FEVER GREATER THAN 100.4 F (38 C) OR HIGHER *CHILLS OR SWEATING *NAUSEA AND VOMITING THAT IS NOT CONTROLLED WITH YOUR NAUSEA MEDICATION *UNUSUAL SHORTNESS OF BREATH *UNUSUAL BRUISING OR BLEEDING *URINARY PROBLEMS (pain or burning when urinating, or frequent urination) *BOWEL PROBLEMS (unusual diarrhea, constipation, pain near the anus) TENDERNESS IN MOUTH AND THROAT WITH OR WITHOUT PRESENCE OF ULCERS (sore throat, sores in mouth, or a toothache) UNUSUAL RASH, SWELLING OR PAIN  UNUSUAL VAGINAL DISCHARGE OR ITCHING   Items with * indicate a potential emergency and should be followed up as soon as possible or go to the Emergency Department if any problems should occur.  Please show the CHEMOTHERAPY ALERT CARD or IMMUNOTHERAPY ALERT CARD at check-in to the Emergency Department and triage nurse.  Should you have questions after your visit or need to cancel or reschedule your appointment, please contact [facility] [place] - A DEPT OF [facility] [phone]  and follow the prompts.  Office hours are 8:00 a.m. to 4:30 p.m. Monday - Friday. Please note that voicemails left after 4:00 p.m. may not be returned until the following business day.  We are closed weekends and major holidays. You have access to a nurse at all times for urgent questions. Please call the main number to the clinic [phone] and follow the prompts.  For any non-urgent questions, you may also contact your provider using MyChart. We now offer e-Visits for anyone 25 and older to request care online for non-urgent symptoms. For details visit [email or web address].   Also download the MyChart app! Go to the app store, search "MyChart", open the app, select [place], and log in with your MyChart username and password.

## 2024-03-16 NOTE — Patient Instructions (Signed)
 [facility] at [facility] **VISIT SUMMARY & IMPORTANT INSTRUCTIONS **   You were seen today by Sheril Dines PA-C for your follow-up visit.    IRON  DEFICIENCY Your hemoglobin/red blood cells look great!  You are not anemic. However, you do have some mild iron  deficiency, which could be causing some of your symptoms of fatigue. Will schedule you for IV iron  x 1 dose.  VITAMIN B12 DEFICIENCY Your B12 levels are moderately low.  We will give vitamin B12 injection x 1 in clinic today. Prescription has been sent to your pharmacy for you to start taking vitamin B12 1000 mcg supplement daily.  FOLLOW-UP APPOINTMENT: Repeat labs and phone visit in 6 months.  ** Thank you for trusting me with your healthcare!  I strive to provide all of my patients with quality care at each visit.  If you receive a survey for this visit, I would be so grateful to you for taking the time to provide feedback.  Thank you in advance!  ~ Emitt Maglione                   Dr. Paulett Boros   &   Sheril Dines, PA-C   - - - - - - - - - - - - - - - - - -    Thank you for choosing [facility] at [facility] to provide your oncology and hematology care.  To afford each patient quality time with our provider, please arrive at least 15 minutes before your scheduled appointment time.   If you have a lab appointment with the Cancer Center please come in thru the Main Entrance and check in at the main information desk.  You need to re-schedule your appointment should you arrive 10 or more minutes late.  We strive to give you quality time with our providers, and arriving late affects you and other patients whose appointments are after yours.  Also, if you no show three or more times for appointments you may be dismissed from the clinic at the providers discretion.     Again, thank you for choosing [facility].  Our hope is that these requests will decrease the  amount of time that you wait before being seen by our physicians.       _____________________________________________________________  Should you have questions after your visit to [facility], please contact our office at [phone] and follow the prompts.  Our office hours are 8:00 a.m. and 4:30 p.m. Monday - Friday.  Please note that voicemails left after 4:00 p.m. may not be returned until the following business day.  We are closed weekends and major holidays.  You do have access to a nurse 24-7, just call the main number to the clinic [phone] and do not press any options, hold on the line and a nurse will answer the phone.    For prescription refill requests, have your pharmacy contact our office and allow 72 hours.

## 2024-03-20 ENCOUNTER — Inpatient Hospital Stay

## 2024-03-20 VITALS — BP 113/71 | HR 54 | Temp 97.9°F | Resp 18

## 2024-03-20 DIAGNOSIS — D509 Iron deficiency anemia, unspecified: Secondary | ICD-10-CM

## 2024-03-20 MED ORDER — METHYLPREDNISOLONE SODIUM SUCC 125 MG IJ SOLR
125.0000 mg | Freq: Once | INTRAMUSCULAR | Status: AC
  Administered 2024-03-20: 125 mg via INTRAVENOUS
  Filled 2024-03-20: qty 2

## 2024-03-20 MED ORDER — CETIRIZINE HCL 10 MG/ML IV SOLN: 10.0000 mg | Freq: Once | INTRAVENOUS | Status: DC

## 2024-03-20 MED ORDER — SODIUM CHLORIDE 0.9 % IV SOLN
510.0000 mg | Freq: Once | INTRAVENOUS | Status: AC
  Administered 2024-03-20: 510 mg via INTRAVENOUS
  Filled 2024-03-20: qty 510

## 2024-03-20 MED ORDER — SODIUM CHLORIDE 0.9 % IV SOLN: Freq: Once | INTRAVENOUS | Status: AC

## 2024-03-20 MED ORDER — FAMOTIDINE IN NACL 20-0.9 MG/50ML-% IV SOLN
20.0000 mg | Freq: Once | INTRAVENOUS | Status: AC
  Administered 2024-03-20: 20 mg via INTRAVENOUS
  Filled 2024-03-20: qty 50

## 2024-03-20 MED ORDER — ACETAMINOPHEN 325 MG PO TABS
650.0000 mg | ORAL_TABLET | Freq: Once | ORAL | Status: AC
  Administered 2024-03-20: 650 mg via ORAL
  Filled 2024-03-20: qty 2

## 2024-03-20 MED ORDER — LORATADINE 10 MG PO TABS
10.0000 mg | ORAL_TABLET | Freq: Once | ORAL | Status: AC
Start: 2024-03-20 — End: 2024-03-20
  Administered 2024-03-20: 10 mg via ORAL
  Filled 2024-03-20: qty 1

## 2024-03-20 NOTE — Patient Instructions (Signed)

## 2024-03-20 NOTE — Progress Notes (Signed)
 Patient tolerated iron infusion with no complaints voiced.  Peripheral IV site clean and dry with good blood return noted before and after infusion.  Band aid applied.  VSS with discharge and left in satisfactory condition with no s/s of distress noted.

## 2024-09-09 ENCOUNTER — Inpatient Hospital Stay: Attending: Physician Assistant

## 2024-09-09 DIAGNOSIS — D509 Iron deficiency anemia, unspecified: Secondary | ICD-10-CM | POA: Insufficient documentation

## 2024-09-09 DIAGNOSIS — E538 Deficiency of other specified B group vitamins: Secondary | ICD-10-CM | POA: Diagnosis present

## 2024-09-09 DIAGNOSIS — D500 Iron deficiency anemia secondary to blood loss (chronic): Secondary | ICD-10-CM

## 2024-09-09 LAB — CBC WITH DIFFERENTIAL/PLATELET
Abs Immature Granulocytes: 0.03 K/uL (ref 0.00–0.07)
Basophils Absolute: 0 K/uL (ref 0.0–0.1)
Basophils Relative: 1 %
Eosinophils Absolute: 0.2 K/uL (ref 0.0–0.5)
Eosinophils Relative: 3 %
HCT: 45.2 % (ref 36.0–46.0)
Hemoglobin: 14.6 g/dL (ref 12.0–15.0)
Immature Granulocytes: 0 %
Lymphocytes Relative: 21 %
Lymphs Abs: 1.5 K/uL (ref 0.7–4.0)
MCH: 30 pg (ref 26.0–34.0)
MCHC: 32.3 g/dL (ref 30.0–36.0)
MCV: 93 fL (ref 80.0–100.0)
Monocytes Absolute: 0.6 K/uL (ref 0.1–1.0)
Monocytes Relative: 8 %
Neutro Abs: 4.9 K/uL (ref 1.7–7.7)
Neutrophils Relative %: 67 %
Platelets: 208 K/uL (ref 150–400)
RBC: 4.86 MIL/uL (ref 3.87–5.11)
RDW: 13.3 % (ref 11.5–15.5)
WBC: 7.3 K/uL (ref 4.0–10.5)
nRBC: 0 % (ref 0.0–0.2)

## 2024-09-09 LAB — IRON AND TIBC
Iron: 57 ug/dL (ref 28–170)
Saturation Ratios: 16 % (ref 10.4–31.8)
TIBC: 354 ug/dL (ref 250–450)
UIBC: 298 ug/dL

## 2024-09-09 LAB — VITAMIN B12: Vitamin B-12: 904 pg/mL (ref 180–914)

## 2024-09-09 LAB — FERRITIN: Ferritin: 227 ng/mL (ref 11–307)

## 2024-09-15 NOTE — Progress Notes (Unsigned)
 VIRTUAL VISIT via TELEPHONE NOTE [facility]   I connected with Annette Frank  on 09/16/24 at 9:00 AM by telephone and verified that I am speaking with the correct person using two identifiers.  Location: Patient: Home Provider: [facility]   I discussed the limitations, risks, security and privacy concerns of performing an evaluation and management service by telephone and the availability of in person appointments. I also discussed with the patient that there may be a patient responsible charge related to this service. The patient expressed understanding and agreed to proceed.  REASON FOR VISIT:  Follow-up for iron  deficiency anemia   PRIOR THERAPY: PRBC transfusions and intermittent IV iron    CURRENT THERAPY: Ferrous bisglycinate + oral B12 supplement  INTERVAL HISTORY:  Annette Frank is contacted today for follow-up of iron  deficiency and B12 deficiency.   She was last seen by Pleasant Barefoot PA-C on 03/16/2024.    At today's visit, patient reports feeling fair. No hospitalizations, surgeries, or new diagnoses since last visit.    She felt some improved energy after Feraheme  x 1 on 03/20/2024, although it initially caused some aches and fatigue. She denies any signs of blood loss such as bright red blood per rectum, melena, or epistaxis.   She reports some dyspnea on exertion and headaches. She denies any pica, exertional chest pain, lightheadedness, or syncope.  She restarted vitamin B12 1000 mcg daily after visit in May 2025.  She has 50% energy and 75% appetite.  She endorses that she is maintaining a stable weight.  ASSESSMENT & PLAN:  1.  Iron  deficiency anemia: - Ongoing anemia since 2010.  Patient has been extensively worked up for her anemia.  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  No evidence of hematologic neoplasm or morphology.  Normal cytogenetics.  No increase in blasts on flow cytometry. - Patient is unable to tolerate p.o. iron  supplements due to GI upset (failed trial of both ferrous sulfate and ferrous bisglycinate) - Patient is unable to tolerate Venofer due to flulike symptoms.  She does not respond to IV Ferrlecit.  She tolerates Feraheme  with steroid/premedications, although she still has some arthralgia and GI upset following and fusions. 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 - Overall, she has been doing much better after paraesophageal hernia repair, but did have some recurrent iron  deficiency following surgeries last year. - At this time, blood and iron  levels are excellent.  Recommend discharge to PCP, but can return as needed if she has any recurrent iron  deficiency anemia in the future.    2.  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  Defer ongoing management to PCP.   PLAN SUMMARY:  >> Discharge to PCP     REVIEW OF SYSTEMS:   Review of Systems  Constitutional:  Negative for chills, diaphoresis, fever, malaise/fatigue and weight loss.  Respiratory:  Positive for shortness of breath (with exertion). Negative for cough.   Cardiovascular:  Positive for chest pain (rib pain every since paraesophageal hernia repair). Negative for palpitations.  Gastrointestinal:  Negative for abdominal pain, blood in stool, melena, nausea and vomiting.  Musculoskeletal:  Positive for back pain.  Neurological:  Positive for dizziness and headaches.  Psychiatric/Behavioral:  The patient has insomnia.      PHYSICAL EXAM: (per limitations of virtual telephone visit)  The patient is alert and oriented x 3, exhibiting adequate mentation, good mood, and ability to speak in full sentences and execute sound judgement.  WRAP UP:   I discussed the assessment and treatment plan with the patient. The patient was provided an opportunity to ask questions and all were answered. The patient agreed with the plan and demonstrated an understanding of the instructions.   The patient was advised to call back or seek an in-person evaluation if the symptoms worsen or if the condition fails to improve as anticipated.  I provided 13 minutes of non-face-to-face time during this encounter, including >10 minutes of medical discussion.  Pleasant CHRISTELLA Barefoot, PA-C 09/16/2024 9:13 AM

## 2024-09-16 ENCOUNTER — Inpatient Hospital Stay: Admitting: Physician Assistant

## 2024-09-16 DIAGNOSIS — E538 Deficiency of other specified B group vitamins: Secondary | ICD-10-CM

## 2024-09-16 DIAGNOSIS — D500 Iron deficiency anemia secondary to blood loss (chronic): Secondary | ICD-10-CM | POA: Diagnosis not present

## 2024-09-17 LAB — METHYLMALONIC ACID, SERUM: Methylmalonic Acid, Quantitative: 187 nmol/L (ref 0–378)

## 2024-10-26 ENCOUNTER — Encounter: Payer: Self-pay | Admitting: *Deleted
